# Patient Record
Sex: Male | Born: 1950 | Race: White | Hispanic: No | Marital: Married | Smoking: Former smoker
Health system: Southern US, Community
[De-identification: ages and names within clinical notes are randomized; demographics above are authoritative.]

## PROBLEM LIST (undated history)

## (undated) DIAGNOSIS — E611 Iron deficiency: Secondary | ICD-10-CM

## (undated) DIAGNOSIS — G8929 Other chronic pain: Secondary | ICD-10-CM

## (undated) DIAGNOSIS — R7303 Prediabetes: Secondary | ICD-10-CM

## (undated) DIAGNOSIS — M1990 Unspecified osteoarthritis, unspecified site: Secondary | ICD-10-CM

## (undated) DIAGNOSIS — I10 Essential (primary) hypertension: Secondary | ICD-10-CM

## (undated) DIAGNOSIS — IMO0001 Reserved for inherently not codable concepts without codable children: Secondary | ICD-10-CM

## (undated) DIAGNOSIS — M545 Low back pain: Secondary | ICD-10-CM

## (undated) DIAGNOSIS — K219 Gastro-esophageal reflux disease without esophagitis: Secondary | ICD-10-CM

## (undated) DIAGNOSIS — J302 Other seasonal allergic rhinitis: Secondary | ICD-10-CM

## (undated) DIAGNOSIS — E785 Hyperlipidemia, unspecified: Secondary | ICD-10-CM

## (undated) DIAGNOSIS — K649 Unspecified hemorrhoids: Secondary | ICD-10-CM

## (undated) DIAGNOSIS — K760 Fatty (change of) liver, not elsewhere classified: Secondary | ICD-10-CM

## (undated) HISTORY — PX: BACK SURGERY: SHX140

## (undated) HISTORY — DX: Hyperlipidemia, unspecified: E78.5

## (undated) HISTORY — PX: KNEE ARTHROSCOPY: SHX127

## (undated) HISTORY — PX: COLONOSCOPY W/ BIOPSIES AND POLYPECTOMY: SHX1376

## (undated) HISTORY — PX: FRACTURE SURGERY: SHX138

## (undated) HISTORY — DX: Unspecified osteoarthritis, unspecified site: M19.90

## (undated) HISTORY — DX: Gastro-esophageal reflux disease without esophagitis: K21.9

## (undated) HISTORY — DX: Essential (primary) hypertension: I10

---
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
## 2004-11-29 ENCOUNTER — Ambulatory Visit: Payer: Self-pay

## 2005-03-01 ENCOUNTER — Ambulatory Visit: Payer: Self-pay | Admitting: Gastroenterology

## 2005-03-30 ENCOUNTER — Ambulatory Visit: Payer: Self-pay | Admitting: Gastroenterology

## 2005-03-30 ENCOUNTER — Encounter (INDEPENDENT_AMBULATORY_CARE_PROVIDER_SITE_OTHER): Payer: Self-pay | Admitting: *Deleted

## 2007-11-01 ENCOUNTER — Ambulatory Visit (HOSPITAL_COMMUNITY): Admission: RE | Admit: 2007-11-01 | Discharge: 2007-11-01 | Payer: Self-pay | Admitting: Neurological Surgery

## 2008-04-12 HISTORY — PX: POSTERIOR LUMBAR FUSION: SHX6036

## 2008-04-30 ENCOUNTER — Inpatient Hospital Stay (HOSPITAL_COMMUNITY): Admission: RE | Admit: 2008-04-30 | Discharge: 2008-05-06 | Payer: Self-pay | Admitting: Neurological Surgery

## 2008-05-12 HISTORY — PX: ANTERIOR CERVICAL DECOMP/DISCECTOMY FUSION: SHX1161

## 2008-06-08 ENCOUNTER — Ambulatory Visit (HOSPITAL_COMMUNITY): Admission: RE | Admit: 2008-06-08 | Discharge: 2008-06-09 | Payer: Self-pay | Admitting: Neurological Surgery

## 2008-09-18 ENCOUNTER — Encounter: Payer: Self-pay | Admitting: Cardiology

## 2009-08-31 ENCOUNTER — Encounter: Payer: Self-pay | Admitting: Cardiology

## 2009-09-01 ENCOUNTER — Ambulatory Visit: Payer: Self-pay | Admitting: Cardiology

## 2009-09-01 DIAGNOSIS — R079 Chest pain, unspecified: Secondary | ICD-10-CM

## 2009-09-01 DIAGNOSIS — R42 Dizziness and giddiness: Secondary | ICD-10-CM

## 2009-09-01 DIAGNOSIS — I10 Essential (primary) hypertension: Secondary | ICD-10-CM | POA: Insufficient documentation

## 2009-09-14 ENCOUNTER — Telehealth (INDEPENDENT_AMBULATORY_CARE_PROVIDER_SITE_OTHER): Payer: Self-pay | Admitting: *Deleted

## 2009-09-15 ENCOUNTER — Encounter (HOSPITAL_COMMUNITY): Admission: RE | Admit: 2009-09-15 | Discharge: 2009-11-10 | Payer: Self-pay | Admitting: Cardiology

## 2009-09-15 ENCOUNTER — Ambulatory Visit: Payer: Self-pay | Admitting: Cardiology

## 2009-09-15 ENCOUNTER — Ambulatory Visit: Payer: Self-pay

## 2009-09-20 ENCOUNTER — Encounter: Payer: Self-pay | Admitting: Cardiology

## 2009-09-20 ENCOUNTER — Ambulatory Visit (HOSPITAL_COMMUNITY): Admission: RE | Admit: 2009-09-20 | Discharge: 2009-09-20 | Payer: Self-pay | Admitting: Cardiology

## 2009-09-20 ENCOUNTER — Ambulatory Visit: Payer: Self-pay

## 2009-09-20 ENCOUNTER — Ambulatory Visit: Payer: Self-pay | Admitting: Internal Medicine

## 2009-09-20 ENCOUNTER — Telehealth: Payer: Self-pay | Admitting: Cardiology

## 2009-09-21 ENCOUNTER — Ambulatory Visit: Payer: Self-pay | Admitting: Cardiology

## 2009-09-27 LAB — CONVERTED CEMR LAB
BUN: 25 mg/dL — ABNORMAL HIGH (ref 6–23)
Calcium: 8.9 mg/dL (ref 8.4–10.5)
Chloride: 106 meq/L (ref 96–112)
Creatinine, Ser: 1.1 mg/dL (ref 0.4–1.5)
GFR calc non Af Amer: 73.14 mL/min (ref >60)

## 2010-06-12 HISTORY — PX: PROSTATE BIOPSY: SHX241

## 2010-07-12 NOTE — Assessment & Plan Note (Signed)
Summary: np6/chest pain hx of high cholestrol/jml   Referring Provider:  Dr. Shanda Bumps Copland Primary Provider:  Dr. Cleta Alberts  CC:  new patient with chest pain.  He states he thought it was reflux but more often lately he has pain in his left arm.  Pt has also been getting high BP readings at home even after BP medication.  History of Present Illness: 60 yo with history of HTN and hyperlipidemia presents for evaluation of chest pain.  Patient started developing episodes of chest pain last week.  One day last week he walked 2 miles.  After stopping, he felt pressure in his central chest.  He got some relief from burping.  The chest pressure was associated with some pain in his left arm.  The total episode was about 30 minutes.  Since then, patient has had essentially daily episodes of this same chest pressure.  He has checked his BP when the pressure occurs and found it to be elevated around 150-160/100s.  It is not typically associated with exertion.  Most often, it happens about 30 minutes after eating or drinking mild or coffee.  Sometimes, however, he gets chest pressure that is not related to eating.  He also reports "dizziness" (seems like a lightheadedness) that has been lasting most of the day for the last week.  Patient was not orthostatic when checked today.  Patient has had hives with BC powder in the past, which contains ASA.  He has avoided ASA since then.   ECG: sinus arrhythmia, normal  Labs (4/10): HDL 47, LDL 84, TGs 200  Current Medications (verified): 1)  Lisinopril 40 Mg Tabs (Lisinopril) .... Take One Tablet Once Daily 2)  Pravastatin Sodium 40 Mg Tabs (Pravastatin Sodium) .... Take One Tablet Once Daily 3)  Vemma Vitamin Liquid .... Once Daily 4)  Diazepam 5 Mg Tabs (Diazepam) .... Take One Tablet As Needed  Allergies (verified): 1)  ! Jonne Ply  Past History:  Past Medical History: 1. HTN 2. ETT-myoview (6/06): EF 61%, no ischemia or infarction 3. Obesity 4.  Hyperlipidemia 5. Spinal stenosis s/p surgery 2/10.  Has chronic back pain.  6. GERD 7. Allergy (hives) to [facility] Powder, which contains ASA 650 + salicylamide + caffeine  Family History: No premature CAD.  Uncle had an MI in his 36s.   Social History: Married, disabled from back problems.  Has a grandson living with him.  Several children. Quit smoking 20 years ago.  No drugs.    Review of Systems       All systems reviewed and negative except as per HPI.   Vital Signs:  Patient profile:   60 year old male Height:      66 inches Weight:      218 pounds BMI:     35.31 Pulse rate:   83 / minute Pulse (ortho):   78 / minute Pulse rhythm:   regular BP sitting:   136 / 92  (left arm) BP standing:   147 / 97 Cuff size:   large  Vitals Entered By: Judithe Modest CMA (September 01, 2009 8:38 AM)  Serial Vital Signs/Assessments:  Time      Position  BP       Pulse  Resp  Temp     By 10:07 AM  Lying RA  120/90   66                    Amanda Trulove CMA 10:07 AM  Sitting  136/99   78                    Amanda Trulove CMA 10:07 AM  Standing  147/97   78                    Amanda Trulove CMA  Comments: 10:07 AM 2 minutes- 147/93 HR 70 3 minutes-146/100 HR 75 By: Judithe Modest CMA    Physical Exam  General:  Well developed, well nourished, in no acute distress. Head:  normocephalic and atraumatic Nose:  no deformity, discharge, inflammation, or lesions Mouth:  Teeth, gums and palate normal. Oral mucosa normal. Neck:  Neck supple, no JVD. No masses, thyromegaly or abnormal cervical nodes. Lungs:  Clear bilaterally to auscultation and percussion. Heart:  Non-displaced PMI, chest non-tender; regular rate and rhythm, S1, S2 without murmurs, rubs. Soft S4. Carotid upstroke normal, no bruit. Pedals normal pulses. No edema, no varicosities. Abdomen:  Bowel sounds positive; abdomen soft and non-tender without masses, organomegaly, or hernias noted. No hepatosplenomegaly. Msk:  Back  normal, normal gait. Muscle strength and tone normal. Extremities:  No clubbing or cyanosis. Neurologic:  Alert and oriented x 3. Skin:  Intact without lesions or rashes. Psych:  Normal affect.   Impression & Recommendations:  Problem # 1:  CHEST PAIN UNSPECIFIED (ICD-786.50) Patient has cardiac risk factors including HTN, hyperlipidemia, and age.  His chest pain has been occurring for about a week and is atypical in nature.  It may simply be GERD as often it seems to occur about 1/2 hour after eating.  Sometimes it seems to occur at random, however.  Given his risk factors, I will have him do a Lexiscan myoview (may not be able to complete ETT due to back pain).  I will also have him start omeprazole 40 mg daily.  He has a prescription for sucralfate as well.  He had hives after using BC Powder which contains ASA so I am a little reticent to start him on ASA at this time.  If there is an abnormality on the stress test I will readdress this.    Problem # 2:  DIZZINESS (ICD-780.4) Patient has felt "funny" for about a week.  Seems lightheaded all the time.  His BP has been high whenever he checks it despite recent increase in lisinopril to 40 mg daily.  He was not orthostatic today, BP actually rose with standing.  I suspect that his symptoms could be due to elevated BP.   Problem # 3:  UNSPECIFIED ESSENTIAL HYPERTENSION (ICD-401.9) Start chlorthalidone 12.5 mg daily + KCl 10 mEq daily today.  BMET in 2 wks.  Will reassess BP when he returns to clinic in 2 wks.   Other Orders: Nuclear Stress Test (Nuc Stress Test)  Patient Instructions: 1)  Start Omeprazole 40mg  daily 2)  Start Chlorthalidone 12.5mg  daily--this will be one-half of a 25mg  tablet 3)  Start KCl(potassium) daily 4)  Your physician has requested that you have a lexiscan myoview.  For further information please visit [email or web address]  Please follow instruction sheet, as given. 5)  Your physician recommends that you  schedule a follow-up appointment with Dr Shirlee Latch after testing is completed in about 2 weeks. 6)  Have lab done at your next appointment with Dr Shirlee Latch. BMP 401.1  786.50  7)  Take and record your blood pressure--bring the readings to your next appointment with Dr Shirlee Latch. Prescriptions: POTASSIUM CHLORIDE CR 10 MEQ CR-CAPS (POTASSIUM CHLORIDE) Take one  tablet by mouth daily  #30 x 6   Entered by:   Katina Dung, RN, BSN   Authorized by:   Marca Ancona, MD   Signed by:   Katina Dung, RN, BSN on 09/01/2009   Method used:   Electronically to        [facility] (retail)       [address]       [place], [place]  [address]       Ph: [phone]       Fax: [phone]   RxID:   [id number] CHLORTHALIDONE 25 MG TABS (CHLORTHALIDONE) one-half tablet daily  #15 x 6   Entered by:   Katina Dung, RN, BSN   Authorized by:   Marca Ancona, MD   Signed by:   Katina Dung, RN, BSN on 09/01/2009   Method used:   Electronically to        [facility] (retail)       [address]       [place], [place]  [address]       Ph: [phone]       Fax: [phone]   RxID:   [id number] OMEPRAZOLE 40 MG CPDR (OMEPRAZOLE) one daily  #30 x 6   Entered by:   Katina Dung, RN, BSN   Authorized by:   Marca Ancona, MD   Signed by:   Katina Dung, RN, BSN on 09/01/2009   Method used:   Electronically to        [facility] (retail)       [address]       [place], [place]  [address]       Ph: [phone]       Fax: [phone]   RxID:   [id number]

## 2010-07-12 NOTE — Progress Notes (Signed)
Summary: Nuclear Pre-Procedure  Phone Note Outgoing Call Call back at [facility] Phone [phone]   Call placed by: Stanton Kidney, EMT-P,  September 14, 2009 2:10 PM Action Taken: Phone Call Completed Summary of Call: Reviewed information on Myoview Information Sheet (see scanned document for further details).  Spoke with Patient.    Nuclear Med Background Indications for Stress Test: Evaluation for Ischemia   History: Myocardial Perfusion Study  History Comments: 6/06 MPS: EF=61%, NL  Symptoms: Chest Pressure with Exertion, Dizziness, Light-Headedness    Nuclear Pre-Procedure Cardiac Risk Factors: History of Smoking, Hypertension, Lipids Height (in): 66

## 2010-07-12 NOTE — Assessment & Plan Note (Signed)
Summary: f/u myoview   Referring Provider:  Dr. Abbe Amsterdam Primary Provider:  Dr. Cleta Alberts  CC:  follow up stress test.  History of Present Illness: 60 yo with history of HTN and hyperlipidemia presents for evaluation of chest pain.  Patient started developing episodes of chest pain several weeks ago.  One day he walked 2 miles.  After stopping, he felt pressure in his central chest.  He got some relief from burping.  The chest pressure was associated with some pain in his left arm.  The total episode was about 30 minutes.  Since then, patient had had essentially daily episodes of this same chest pressure.  He has checked his BP when the pressure occurs and found it to be elevated around 150-160/100s.  It was not typically associated with exertion.  Most often, it happens about 30 minutes after eating or drinking milk or coffee.  Sometimes, however, he got chest pressure that is not related to eating.  Patient has had hives with BC powder in the past, which contains ASA.  He has avoided ASA since then.   GIven his risk factors, I had the patient do a myoview.  This showed no ischemia or infarction, but EF was measured as 49%.  Visually, the EF appeared better.  Echo was done to confirm LV systolic function and suggested EF was 60-65%.  Patient cut back on caffeine and spicy foods.  This has essentially resolved the chest pain.  He never started omeprazole as he was able to control the  presumed GERD with dietary changes.  His BP has dropped considerably with the addition of chlorthalidone.  Sometimes he is lightheaded with standing.  BP in am after meds has gotten as low as 101/60.    Labs (4/10): HDL 47, LDL 84, TGs 200  Current Medications (verified): 1)  Lisinopril 40 Mg Tabs (Lisinopril) .... Take One Tablet Once Daily 2)  Pravastatin Sodium 40 Mg Tabs (Pravastatin Sodium) .... Take One Tablet Once Daily 3)  Vemma Vitamin Liquid .... Once Daily 4)  Diazepam 5 Mg Tabs (Diazepam) .... Take One  Tablet As Needed 5)  Omeprazole 40 Mg Cpdr (Omeprazole) .... One Daily 6)  Chlorthalidone 25 Mg Tabs (Chlorthalidone) .... One-Half Tablet Daily 7)  Potassium Chloride Cr 10 Meq Cr-Caps (Potassium Chloride) .... Take One Tablet By Mouth Daily  Allergies: 1)  ! Jonne Ply  Past History:  Past Medical History: 1. HTN 2. ETT-myoview (6/06): EF 61%, no ischemia or infarction.  Lexiscan myoview (4/11): EF 49%, no ischemia or infarction.  Visually, EF appeared normal so echo was done to follow this up.  Echo showed EF 55-60%, moderate diastolic dysfunction, no significant valvular dysfunction.  3. Obesity 4. Hyperlipidemia 5. Spinal stenosis s/p surgery 2/10.  Has chronic back pain.  6. GERD 7. Allergy (hives) to [facility] Powder, which contains ASA 650 + salicylamide + caffeine  Family History: Reviewed history from 09/01/2009 and no changes required. No premature CAD.  Uncle had an MI in his 78s.   Social History: Reviewed history from 09/01/2009 and no changes required. Married, disabled from back problems.  Has a grandson living with him.  Several children. Quit smoking 20 years ago.  No drugs.    Review of Systems       All systems reviewed and negative except as per HPI.   Vital Signs:  Patient profile:   60 year old male Height:      66 inches Weight:      215 pounds  BMI:     34.83 Pulse rate:   80 / minute Resp:     14 per minute BP sitting:   115 / 80  (left arm)  Vitals Entered By: Kem Parkinson (September 21, 2009 8:32 AM)  Physical Exam  General:  Well developed, well nourished, in no acute distress. Neck:  Neck supple, no JVD. No masses, thyromegaly or abnormal cervical nodes. Lungs:  Clear bilaterally to auscultation and percussion. Heart:  Non-displaced PMI, chest non-tender; regular rate and rhythm, S1, S2 without murmurs, rubs. Soft S4. Carotid upstroke normal, no bruit. Pedals normal pulses. No edema, no varicosities. Abdomen:  Bowel sounds positive; abdomen soft and  non-tender without masses, organomegaly, or hernias noted. No hepatosplenomegaly. Extremities:  No clubbing or cyanosis. Neurologic:  Alert and oriented x 3. Psych:  Normal affect.   Impression & Recommendations:  Problem # 1:  CHEST PAIN UNSPECIFIED (ICD-786.50) I suspect that the chest pain was GERD.  Myoview showed no ischemia or infarction, and pain episodes seem to have resolved with dietary changes aimed at decreasing GERD.  He can take over the counter omeprazole.    Problem # 2:  DIZZINESS (ICD-780.4) BP seems to be getting too low after taking his meds.  I have asked him to back off on lisinopril to 20 mg daily.  He will take his BP daily and will let us know if it starts getting too high.    Other Orders: TLB-BMP (Basic Metabolic Panel-BMET) (80048-METABOL)  Patient Instructions: 1)  Your physician has recommended you make the following change in your medication:  2)  Decrease Lisinopril to 20mg  daily 3)  Your physician recommends that you have  lab work today--BMP 401.9 4)  Your physician recommends that you schedule a follow-up appointment as needed with Dr Shirlee Latch.

## 2010-07-12 NOTE — Assessment & Plan Note (Signed)
Summary: Cardiology Nuclear Study  Nuclear Med Background Indications for Stress Test: Evaluation for Ischemia   History: Myocardial Perfusion Study  History Comments: 6/06 MPS: EF=61%, NL  Symptoms: Chest Pressure with Exertion, Diaphoresis, Dizziness, DOE, Light-Headedness, Palpitations    Nuclear Pre-Procedure Cardiac Risk Factors: History of Smoking, Hypertension, Lipids, Smoker Caffeine/Decaff Intake: none NPO After: 7:30 PM Lungs: Clear IV 0.9% NS with Angio Cath: 20g     IV Site: (R) Forearm IV Started by: Stanton Kidney EMT-P Chest Size (in) 44     Height (in): 66 Weight (lb): 216 BMI: 34.99  Nuclear Med Study 1 or 2 day study:  1 day     Stress Test Type:  Eugenie Birks Reading MD:  Olga Millers, MD     Referring MD:  Daltom Mclean Resting Radionuclide:  Technetium 37m Tetrofosmin     Resting Radionuclide Dose:  11 mCi  Stress Radionuclide:  Technetium 16m Tetrofosmin     Stress Radionuclide Dose:  33 mCi   Stress Protocol      Max HR:  114 bpm     Predicted Max HR:  163 bpm  Max Systolic BP: 129 mm Hg     Percent Max HR:  69.94 %Rate Pressure Product:  47425  Lexiscan: 0.4 mg   Stress Test Technologist:  Irean Hong RN     Nuclear Technologist:  Domenic Polite CNMT  Rest Procedure  Myocardial perfusion imaging was performed at rest 45 minutes following the intravenous administration of Myoview Technetium 44m Tetrofosmin.  Stress Procedure  The patient received IV Lexiscan 0.4 mg over 15-seconds.  Myoview injected at 30-seconds.  There were no significant changes with Lexiscan., rare PVC.  Quantitative spect images were obtained after a 45 minute delay.  QPS Raw Data Images:  Acuisition technically good; normal left ventricular size. Stress Images:  There is normal uptake in all areas. Rest Images:  Normal homogeneous uptake in all areas of the myocardium. Subtraction (SDS):  No evidence of ischemia. Transient Ischemic Dilatation:  1.18  (Normal <1.22)  Lung/Heart Ratio:  .34  (Normal <0.45)  Quantitative Gated Spect Images QGS EDV:  102 ml QGS ESV:  52 ml QGS EF:  49 % QGS cine images:  Normal wall motion; LV function appears better than calculated EF; suggest echocardiogram to further assess.   Overall Impression  Exercise Capacity: Lexiscan study with no exercise. BP Response: Normal blood pressure response. Clinical Symptoms: There is  chest pain ECG Impression: No significant ST segment change suggestive of ischemia. Overall Impression: There is no sign of scar or ischemia.  Appended Document: Cardiology Nuclear Study-appt 09-21-09 DM -needs echo no evidence for ischemia or infarction.  EF read as low but visually better.  Would get echo to confirm normal EF.   Appended Document: Cardiology Nuclear Study discussed results with patient by telephone--he agreed to echo at [facility] today   Clinical Lists Changes  Orders: Added new Referral order of Echocardiogram (Echo) - Signed

## 2010-07-12 NOTE — Progress Notes (Signed)
Summary: returning call  Phone Note Call from Patient Call back at Home Phone [phone]   Caller: Patient Reason for Call: Talk to Nurse Summary of Call: returning call Initial call taken by: Migdalia Dk,  September 20, 2009 10:55 AM  Follow-up for Phone Call        talked with patient about having echo

## 2010-08-25 ENCOUNTER — Encounter (INDEPENDENT_AMBULATORY_CARE_PROVIDER_SITE_OTHER): Payer: Self-pay | Admitting: *Deleted

## 2010-08-30 NOTE — Letter (Signed)
Summary: Pre Visit Letter Revised  [facility]  [address] [place], [place] [address]   Phone: [phone]  Fax: [phone]        08/25/2010 MRN: [id number] Russell Gonzales [address] [place], [place]  [address]             Procedure Date:  10-05-10           Recall Colon---Dr. Russella Dar   Welcome to the Gastroenterology Division at [facility].    You are scheduled to see a nurse for your pre-procedure visit on 09-21-10 at 10:30a.m. on the 3rd floor at [facility], [address]  We ask that you try to arrive at our office 15 minutes prior to your appointment time to allow for check-in.  Please take a minute to review the attached form.  If you answer "Yes" to one or more of the questions on the first page, we ask that you call the person listed at your earliest opportunity.  If you answer "No" to all of the questions, please complete the rest of the form and bring it to your appointment.    Your nurse visit will consist of discussing your medical and surgical history, your immediate family medical history, and your medications.   If you are unable to list all of your medications on the form, please bring the medication bottles to your appointment and we will list them.  We will need to be aware of both prescribed and over the counter drugs.  We will need to know exact dosage information as well.    Please be prepared to read and sign documents such as consent forms, a financial agreement, and acknowledgement forms.  If necessary, and with your consent, a friend or relative is welcome to sit-in on the nurse visit with you.  Please bring your insurance card so that we may make a copy of it.  If your insurance requires a referral to see a specialist, please bring your referral form from your primary care physician.  No co-pay is required for this nurse visit.     If you cannot keep your appointment, please call [phone] to cancel or  reschedule prior to your appointment date.  This allows [name] the opportunity to schedule an appointment for another patient in need of care.    Thank you for choosing [facility] for your medical needs.  We appreciate the opportunity to care for you.  Please visit [name] at our website  to learn more about our practice.  Sincerely, The Gastroenterology Division

## 2010-09-21 ENCOUNTER — Ambulatory Visit (AMBULATORY_SURGERY_CENTER): Payer: Medicare Other | Admitting: *Deleted

## 2010-09-21 ENCOUNTER — Other Ambulatory Visit: Payer: Self-pay | Admitting: Cardiology

## 2010-09-21 VITALS — Ht 66.5 in | Wt 233.0 lb

## 2010-09-21 DIAGNOSIS — Z860100 Personal history of colon polyps, unspecified: Secondary | ICD-10-CM

## 2010-09-21 MED ORDER — PEG-KCL-NACL-NASULF-NA ASC-C 100 G PO SOLR: ORAL | Status: DC

## 2010-09-28 ENCOUNTER — Other Ambulatory Visit: Payer: Self-pay | Admitting: *Deleted

## 2010-09-28 MED ORDER — CHLORTHALIDONE 25 MG PO TABS: 12.5000 mg | ORAL_TABLET | Freq: Every day | ORAL | Status: DC

## 2010-10-04 ENCOUNTER — Encounter: Payer: Self-pay | Admitting: Gastroenterology

## 2010-10-05 ENCOUNTER — Encounter: Payer: Self-pay | Admitting: Gastroenterology

## 2010-10-05 ENCOUNTER — Ambulatory Visit (AMBULATORY_SURGERY_CENTER): Payer: Medicare Other | Admitting: Gastroenterology

## 2010-10-05 DIAGNOSIS — Z1211 Encounter for screening for malignant neoplasm of colon: Secondary | ICD-10-CM

## 2010-10-05 DIAGNOSIS — Z86010 Personal history of colonic polyps: Secondary | ICD-10-CM

## 2010-10-05 DIAGNOSIS — D126 Benign neoplasm of colon, unspecified: Secondary | ICD-10-CM

## 2010-10-05 DIAGNOSIS — K5730 Diverticulosis of large intestine without perforation or abscess without bleeding: Secondary | ICD-10-CM

## 2010-10-05 MED ORDER — SODIUM CHLORIDE 0.9 % IV SOLN: 500.0000 mL | INTRAVENOUS | Status: DC

## 2010-10-05 NOTE — Patient Instructions (Signed)
Discharged instructions given with verbal understanding. Handouts on polyps, diverticulosis, and hemorrhoids given. Resume previous medications.

## 2010-10-06 ENCOUNTER — Telehealth: Payer: Self-pay

## 2010-10-06 NOTE — Telephone Encounter (Signed)

## 2010-10-10 ENCOUNTER — Encounter: Payer: Self-pay | Admitting: Gastroenterology

## 2010-10-25 NOTE — Op Note (Signed)
NAME:  Russell Gonzales, Russell Gonzales NO.:  [id number]   MEDICAL RECORD NO.:  [id number]          PATIENT TYPE:  OIB   LOCATION:  3535                         FACILITY:  [facility]   PHYSICIAN:  Stefani Dama, M.D.  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   PROCEDURES:  Anterior cervical decompression and arthrodesis at C5-C6  with structural allograft Alphatec plate fixation (Trestle)   SURGEON:  Stefani Dama, MD   FIRST ASSISTANT:  Coletta Memos, MD   ANESTHESIA:  General endotracheal.   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  After undergoing decompression of his  lumbar spine, the patient noted that he had developed numbness and  tingling in the fingers on the right hand accompanied with some  substantial neck pain which has persisted despite the passage of  considerable time.  An MRI of the cervical spine demonstrates that he  has advanced spondylitic changes at C5-C6 and he is advised regarding  surgical decompression at the C5-C6 level.   PROCEDURE:  The patient was brought to the operating room and placed on  the operating table in the supine position.  After smooth induction of  general endotracheal anesthesia, he was placed in 5 pounds of halter  traction.  Neck was prepped with alcohol and DuraPrep and draped in a  sterile fashion.  A transverse incision was created in the left side of  the neck and this was carried down through the platysma.  The plane  between the sternocleidomastoid and strap muscles was then  dissected  bluntly until the prevertebral space was reached.  The first  identifiable disk space was noted to be that of C4-C5 on localizing  radiograph, then by dissecting on either side of the longus coli  muscles.  Self-retaining Caspar retractor was placed into the wound and  then diskectomy was undertaken at C5-C6, opening ventral aspect of the  vertebral bodies with a #15 blade removing significant quantities of  severely degenerated and desiccated disk material and ventral  osteophytes were removed with a 3-mm Kerrison punch and also use of a  Beyer rongeur.  As the disk space was evacuated, small 3 curettes were  used to facilitate in removal of the cartilaginous endplates until the  region of posterior longitudinal ligament was reached.  Here, there was  a substantial osteophyte from the inferior margin body of C5.  This was  drilled down with a 2.3-mm dissecting tool and a high-speed instrument  and the dissection was carried out to the lateral recesses where  uncinate process spurs were also encountered, more so on the right than  on the left.  These were  dissected down.  The lateral recesses were then  freed with a 2-mm Kerrison punch.  Ultimately, the C6 nerve root could  be sounded easily at the right side and also at the left side after full  and thorough decompression was performed.  Hemostasis was then achieved  with some small pledgets of Gelfoam soaked in thrombin which were  irrigated away.  Then, with the endplates being shaved smooth with a 5-  mm barrel bit, an 8-mm transgraft was fashioned to the appropriate size  and shape and fitted into the interspace after being filled with some  demineralized bone matrix in the form of the gel.  This was then placed  into the interspace and countersunk appropriately.  The ventral aspect  of the vertebral bodies were then fitted with 14-mm Trestle plate and  this was locked into position with variable angle 14-mm screws.   Final  radiograph was obtained to check the position of the surgical construct.  Some remaining demineralized bone matrix was then packed into the  lateral recesses, more so on the left side than on the right side.  Hemostasis in the soft tissues was then meticulously obtained and  ultimately the retractors were removed and after careful inspection for  hemostasis of second and third time, the platysma was closed with 3-0  Vicryl in an interrupted fashion, 3-0 Vicryl was used in the  subcuticular skin, and then Dermabond was placed on the skin.  The  patient tolerated the procedure well and was returned to the recovery  room in stable condition.      Stefani Dama, M.D.  Electronically Signed     HJE/MEDQ  D:  06/08/2008  T:  06/09/2008  Job:  [id number]

## 2010-10-25 NOTE — Op Note (Signed)
NAME:  HUMBERT, MOROZOV NO.:  [id number]   MEDICAL RECORD NO.:  [id number]          PATIENT TYPE:  INP   LOCATION:  3109                         FACILITY:  [facility]   PHYSICIAN:  Stefani Dama, M.D.  DATE OF BIRTH:  [birth year]   DATE OF PROCEDURE:  04/30/2008  DATE OF DISCHARGE:                               OPERATIVE REPORT   PREOPERATIVE DIAGNOSES:  Lumbar stenosis, lumbar scoliosis, L2, L3, L4,  and L5; status post traumatic fracture, L3 and L4 30 years ago.   POSTOPERATIVE DIAGNOSES:  Lumbar stenosis, lumbar scoliosis, L2, L3, L4,  and L5; status post traumatic fracture, L3 and L4 30 years ago.   PROCEDURES:  Lumbar laminectomy, L2, L3, L4, and L5; decompression of  L3, L4, and L5 nerve roots; posterior interbody arthrodesis with PEEK  spacer, L4-L5; segmental fixation, L1-S1 with pedicle screws; and  posterolateral arthrodesis with local autograft, allograft, and Infuse,  L1-S1.   SURGEON:  Stefani Dama, MD   FIRST ASSISTANT:  Payton Doughty, MD   ANESTHESIA:  General endotracheal.   INDICATIONS:  Russell Gonzales is a 60 year old individual who has had  significant problems with back pain and leg pain mostly on the left hip  and left buttock.  He had sustained a fracture some 30 years ago of his  L2-L4 complex with the worst fracture at L3.  He had developed a  significant kyphosis and scoliosis across this level and was complaining  of severe back pain and leg pain with any activity.  He was evaluated  with a myelogram and post myelogram CAT scan several months ago and was  advised regarding the decompression and stabilization procedure.  He was  now taken to the operating room for this procedure.   PROCEDURE:  The patient was brought to the operating room supine on the  stretcher.  After smooth induction of general endotracheal anesthesia,  he was turned prone and the back was prepped with alcohol and DuraPrep  and draped in the sterile fashion.  A  midline incision was created and  carried down to the lumbodorsal fascia, which was opened on either side  of the midline to expose the interlaminar spaces and the lamina from L1  and S1.  The individual spinous processes of L5, L1, and L3 were  identified on a radiograph and then dissection was carried out to secure  these and dissect over the region of the facet joints.  On the right  side, there was grotesque overgrowth of the facet joint at the L3-4  complex where a posterior fusion had formed itself.  This mass was taken  down to allow decompression, and ultimately, the laminectomy was created  removing the laminar arches of L2, L3, and L4, and ultimately L5.  At  L4, the decompression was undertaken.  There was noted to be adherence  of the dura to the laminar arch and a dural tear had occurred.  The  lamina of L5 was then carefully removed around this dural tear.  The  arachnoid was not incised, and then the dura was closed with  the use of  the operating microscope in a microdissection technique to secure any  CSF leak.  Three separate 6-0 Prolene sutures were required for this  dural repair.  Dissection was then continued further distally and  ultimately, we could sound out the S1 nerve root, the L5 nerve root, and  foraminotomy created over them.  L4 nerve root was particularly snug on  the left side where there was the start of the scoliotic curve.  This  area was decompressed carefully, and the disk space was then entered  ultimately.  We could gain good egress of the L5 nerve root as it exited  below the L4-5 disk space on the left side.  The complete diskectomy was  performed from the left-sided approach using combination of curettes,  rongeurs, and disk shavers to remove significant disk osteophyte complex  and disk material to the point where an interbody spacer could be  placed.  A 10-mm spacer was then chosen and placed into this area to  obtain some distraction of the  disk space, thus opening the region of  the foramen for the L5 nerve root inferiorly and the L4 nerve root  superiorly.  At this point, the L4 nerve root could be sounded easily.  Further dissection yielded decompression of the L3 nerve root and  ultimately the L2 nerve root superiorly.  The mass from L2-L4 was noted  be fused together and despite removal of the laminar arches, the fusion  mass persisted.  It was felt that osteotomies of this would not be  possible because of the grotesque overgrowth of the ventral aspect of  the vertebral bodies, and this was left together.  Pedicle screw sites  were then chosen using fluoroscopic guidance placing bilateral pedicle  screws on the sacrum.  These were measured at 7.5 diameter x 45 mm  screws on the right and 7.5 x 55 mm screw on the left, 6.5 x 45 mm  screws were placed at L5, L4 on the left side could be instrumented with  a 6.5 x 45 mm screw, then L2 on the left side could be instrumented with  a 5.5 x 45 mm screw, and L1 had 6.5 x 45 mm screws placed bilaterally.  The right-sided vertebral body of L2 also had a 6.5 x 45 mm screw  placed.  L4 was also instrumented on the left side with a 6.5 x 45 mm  screws.  Once the screws were placed, rods were contoured to fit between  the screw heads.  On the right side, a sidecar connector was required to  secure the L5 screw to the construct.  Reduction of the scoliosis was  not possible because of the solid fusion of L2-L4.  Fixation was then  performed in situ fashion with pedicle screw hardware to secure L1-S1.  On the left side, the rod could be contoured to fit the screw heads as  they were, although the left-sided sacral screw was lengthened to the 55  mm length in order to allow purchase on the rod.  Once this was secured,  the posterolateral gutters were packed with bone graft in the form of  Vitoss bone sponge along with local autograft that was harvested from  the laminar arches.  This  was also fortified with strips of Infuse in  the lateral gutters.  At this point, then with bone graft being placed  and the hardware being secured, hemostasis was checked in the lateral  gutters.  The  CSF space was checked with secured type watertight closure  in all of the dural areas.  The nerve roots were again sounded to make  sure that they were well decompressed.  Once this was found to be the  case, then the lumbodorsal fascia was closed with #1 Vicryl in an  interrupted fashion, 2-0 Vicryl was used subcutaneously, 3-0 Vicryl was  used subcuticularly, and surgical staples were placed in the skin.  Blood loss for this procedure which lasted approximately 9 hours was  4000 mL, 1800 mL of Cell Saver blood was returned to the patient.      Stefani Dama, M.D.  Electronically Signed     HJE/MEDQ  D:  04/30/2008  T:  05/01/2008  Job:  [id number]

## 2010-10-28 NOTE — Discharge Summary (Signed)
NAME:  [name], [name] NO.:  [id number]   MEDICAL RECORD NO.:  [id number]          PATIENT TYPE:  INP   LOCATION:  3007                         FACILITY:  [facility]   PHYSICIAN:  Stefani Dama, M.D.  DATE OF BIRTH:  [birth year]   DATE OF ADMISSION:  04/30/2008  DATE OF DISCHARGE:  05/06/2008                               DISCHARGE SUMMARY   ADMITTING DIAGNOSIS:  Spinal stenosis, scoliosis L2-L3, L3-L4, L4-L5  with radiculopathy, status post L3-L4 fracture remotely 30 years ago.   DISCHARGE DIAGNOSES:  1. Spinal stenosis, scoliosis L2-L3, L3-L4, L4-L5 with radiculopathy,      status post L3-L4 fracture remotely 30 years ago.  2. Acute blood loss anemia, stable.   OPERATIONS AND PROCEDURES:  Decompression L2-L3, L4-L5, posterolateral  interbody fusion L4-L5 with segmental fixation L1-S1, and posterolateral  fusion L1-S1.   BRIEF HISTORY:  Russell Gonzales is a 61 year old male who had a remote  fracture to his lumbar spine 30 years ago, developed a degenerative  collapsing scoliosis, now has chronic low back pain and radiculopathy  and live with conservative measures and elects to proceed with surgical  fixation.   HOSPITAL COURSE:  The patient underwent posterior spinal fusion L1-S1  with decompression on November 19, tolerated procedure well, placed on a  PCA Dilaudid pump for pain control, placed on clear liquids first day.  Postoperatively, he was eating well.  Foley catheter was discontinued.  Hemoglobin was 8.1, hematocrit 22.7.  He was transferred out of the  neurosurgical ICU to 3000.  On second day postoperatively, he started  with physical therapy, occupational therapy, his IV fluids were  increased.  Vital signs were stable, afebrile.  BMET within normal  limits.  He continued to make slow progress over the weekend.  He was  weaned off his PCA pump to p.o. pain meds.  He was ready for discharge  home on November 25.  He was eating well, voiding well,  comfortable on  Percocet and Valium.  He was given Lovenox prophylaxis against DVT.  A  durable medical equipment was ordered as needed.  He is to follow back  precautions.  Aspen Quickdraw brace while he is up and out of bed.  Follow up with Dr. Danielle Dess in 3 to 4 weeks.  Contact our office prior to  followup with any questions, concerns, fever greater than 103, or  concerns about wound healing, any signs of infection.  All questions  encouraged and answered.   DISCHARGE CONDITION:  Stable, improved.   DISCHARGE DIAGNOSES:  Spondylosis, degenerative disk disease lumbar  spine with collapsing degenerative scoliosis, previous L2-L3 fracture,  and spinal stenosis.      Aura Fey Bobbe Medico.      Stefani Dama, M.D.  Electronically Signed    SCI/MEDQ  D:  07/23/2008  T:  07/23/2008  Job:  [id number]

## 2011-03-14 LAB — POCT I-STAT 7, (LYTES, BLD GAS, ICA,H+H)
Acid-Base Excess: 2
Bicarbonate: 26.8 — ABNORMAL HIGH
Calcium, Ion: 1.17
HCT: 30 — ABNORMAL LOW
O2 Saturation: 100
O2 Saturation: 100
Potassium: 4.6
Sodium: 139
Sodium: 140
pCO2 arterial: 41.1
pH, Arterial: 7.408

## 2011-03-14 LAB — BASIC METABOLIC PANEL
BUN: 16
BUN: 7
CO2: 25
CO2: 25
CO2: 27
Calcium: 6.6 — ABNORMAL LOW
Calcium: 6.7 — ABNORMAL LOW
Calcium: 8 — ABNORMAL LOW
Chloride: 104
Chloride: 108
Chloride: 96
Creatinine, Ser: 0.73
Creatinine, Ser: 0.74
Creatinine, Ser: 0.79
Creatinine, Ser: 1.09
GFR calc Af Amer: >60
GFR calc Af Amer: >60
GFR calc Af Amer: >60
GFR calc Af Amer: >60
GFR calc Af Amer: >60
GFR calc non Af Amer: >60
GFR calc non Af Amer: >60
GFR calc non Af Amer: >60
Glucose, Bld: 101 — ABNORMAL HIGH
Glucose, Bld: 99
Potassium: 4.3
Sodium: 130 — ABNORMAL LOW
Sodium: 134 — ABNORMAL LOW
Sodium: 138

## 2011-03-14 LAB — CROSSMATCH
ABO/RH(D): O POS
Antibody Screen: NEGATIVE

## 2011-03-14 LAB — HEPATIC FUNCTION PANEL
Albumin: 4
Alkaline Phosphatase: 73
Bilirubin, Direct: <0.1
Total Bilirubin: 0.8

## 2011-03-14 LAB — CBC
HCT: 30.4 — ABNORMAL LOW
HCT: 42.6
Hemoglobin: 10 — ABNORMAL LOW
Hemoglobin: 10.4 — ABNORMAL LOW
Hemoglobin: 14.6
Hemoglobin: 7.8 — CL
MCHC: 34.4
MCHC: 34.4
MCHC: 34.6
MCHC: 35.7
MCV: 96.8
MCV: 97.3
Platelets: 95 — ABNORMAL LOW
RBC: 2.28 — ABNORMAL LOW
RBC: 2.99 — ABNORMAL LOW
RBC: 3.12 — ABNORMAL LOW
RBC: 4.4
RDW: 12.9
RDW: 13.3
WBC: 11.6 — ABNORMAL HIGH
WBC: 5.3

## 2011-03-14 LAB — TYPE AND SCREEN
ABO/RH(D): O POS
Antibody Screen: NEGATIVE

## 2011-03-17 LAB — CBC
HCT: 39.2 % (ref 39.0–52.0)
Hemoglobin: 13.1 g/dL (ref 13.0–17.0)
MCHC: 33.3 g/dL (ref 30.0–36.0)
MCV: 95.2 fL (ref 78.0–100.0)
RBC: 4.11 MIL/uL — ABNORMAL LOW (ref 4.22–5.81)

## 2011-03-17 LAB — BASIC METABOLIC PANEL
CO2: 29 mEq/L (ref 19–32)
Calcium: 8.9 mg/dL (ref 8.4–10.5)
Chloride: 102 mEq/L (ref 96–112)
GFR calc Af Amer: >60 mL/min (ref >60)
Glucose, Bld: 73 mg/dL (ref 70–99)
Potassium: 3.8 mEq/L (ref 3.5–5.1)
Sodium: 139 mEq/L (ref 135–145)

## 2011-08-15 ENCOUNTER — Encounter: Payer: Self-pay | Admitting: Emergency Medicine

## 2011-08-29 ENCOUNTER — Ambulatory Visit: Payer: Self-pay | Admitting: Emergency Medicine

## 2012-02-20 ENCOUNTER — Encounter: Payer: Self-pay | Admitting: Emergency Medicine

## 2012-02-25 ENCOUNTER — Other Ambulatory Visit: Payer: Self-pay | Admitting: Emergency Medicine

## 2012-02-27 ENCOUNTER — Other Ambulatory Visit: Payer: Self-pay | Admitting: Emergency Medicine

## 2012-03-05 ENCOUNTER — Other Ambulatory Visit: Payer: Self-pay | Admitting: Emergency Medicine

## 2012-03-05 NOTE — Telephone Encounter (Signed)
Chart pulled to PA pool at nurse station [phone]

## 2012-03-06 ENCOUNTER — Other Ambulatory Visit: Payer: Self-pay | Admitting: Emergency Medicine

## 2012-03-06 ENCOUNTER — Ambulatory Visit (INDEPENDENT_AMBULATORY_CARE_PROVIDER_SITE_OTHER): Payer: Medicare Other | Admitting: Emergency Medicine

## 2012-03-06 ENCOUNTER — Ambulatory Visit: Payer: Medicare Other

## 2012-03-06 VITALS — BP 124/76 | HR 97 | Temp 98.3°F | Resp 16 | Ht 65.5 in | Wt 243.0 lb

## 2012-03-06 DIAGNOSIS — E785 Hyperlipidemia, unspecified: Secondary | ICD-10-CM

## 2012-03-06 DIAGNOSIS — I10 Essential (primary) hypertension: Secondary | ICD-10-CM

## 2012-03-06 DIAGNOSIS — Z23 Encounter for immunization: Secondary | ICD-10-CM

## 2012-03-06 DIAGNOSIS — Z139 Encounter for screening, unspecified: Secondary | ICD-10-CM

## 2012-03-06 DIAGNOSIS — Z0000 Encounter for general adult medical examination without abnormal findings: Secondary | ICD-10-CM

## 2012-03-06 LAB — IFOBT (OCCULT BLOOD): IFOBT: POSITIVE

## 2012-03-06 LAB — COMPREHENSIVE METABOLIC PANEL
Albumin: 4.3 g/dL (ref 3.5–5.2)
Alkaline Phosphatase: 71 U/L (ref 39–117)
BUN: 13 mg/dL (ref 6–23)
Creat: 0.8 mg/dL (ref 0.50–1.35)
Glucose, Bld: 102 mg/dL — ABNORMAL HIGH (ref 70–99)
Potassium: 4.6 mEq/L (ref 3.5–5.3)

## 2012-03-06 LAB — POCT URINALYSIS DIPSTICK
Glucose, UA: NEGATIVE
Nitrite, UA: NEGATIVE
Urobilinogen, UA: 0.2

## 2012-03-06 LAB — POCT CBC
Lymph, poc: 2.2 (ref 0.6–3.4)
MCHC: 32 g/dL (ref 31.8–35.4)
MPV: 8.1 fL (ref 0–99.8)
POC Granulocyte: 4 (ref 2–6.9)
POC LYMPH PERCENT: 32.3 %L (ref 10–50)
POC MID %: 8.5 %M (ref 0–12)
RDW, POC: 13.7 %

## 2012-03-06 LAB — LIPID PANEL
Cholesterol: 184 mg/dL (ref 0–200)
HDL: 57 mg/dL (ref >39)
Triglycerides: 116 mg/dL (ref <150)
VLDL: 23 mg/dL (ref 0–40)

## 2012-03-06 LAB — POCT UA - MICROSCOPIC ONLY
Casts, Ur, LPF, POC: NEGATIVE
Yeast, UA: NEGATIVE

## 2012-03-06 MED ORDER — PRAVASTATIN SODIUM 40 MG PO TABS: 40.0000 mg | ORAL_TABLET | Freq: Every day | ORAL | Status: DC

## 2012-03-06 MED ORDER — LISINOPRIL 20 MG PO TABS: 20.0000 mg | ORAL_TABLET | Freq: Every day | ORAL | Status: DC

## 2012-03-06 MED ORDER — CHLORTHALIDONE 25 MG PO TABS: 12.5000 mg | ORAL_TABLET | Freq: Every day | ORAL | Status: DC

## 2012-03-06 MED ORDER — OMEPRAZOLE 40 MG PO CPDR: 40.0000 mg | DELAYED_RELEASE_CAPSULE | Freq: Every day | ORAL | Status: DC

## 2012-03-06 NOTE — Progress Notes (Signed)
@[email or web address] @  Patient ID: Russell Gonzales MRN: [id number], DOB: Apr 06, 1951 61 y.o. Date of Encounter: 03/06/2012, 10:58 AM  Primary Physician: Lucilla Edin, MD  Chief Complaint: Physical (CPE)  HPI: 61 y.o. y/o male with history noted below here for CPE.  Doing well. No issues/complaints.  Review of Systems:  Consitutional: No fever, chills, fatigue, night sweats, lymphadenopathy, or weight changes. Eyes: No visual changes, eye redness, or discharge. ENT/Mouth: Ears: No otalgia, tinnitus, hearing loss, discharge. Nose: No congestion, rhinorrhea, sinus pain, or epistaxis. Throat: No sore throat, post nasal drip, or teeth pain. Cardiovascular: No CP, palpitations, diaphoresis, DOE, edema, orthopnea, PND he is on medications for blood pressure and high cholesterol. Respiratory: No cough, hemoptysis, SOB, or wheezing. Gastrointestinal: No anorexia, dysphagia, reflux, pain, nausea, vomiting, hematemesis, diarrhea, constipation, BRBPR, or melena. Genitourinary: No dysuria, frequency, urgency, hematuria, incontinence, nocturia, decreased urinary stream, discharge, impotence, or testicular pain/masses. Musculoskeletal: No decreased ROM, myalgias, stiffness, joint swelling, or weakness. Skin: No rash, erythema, lesion changes, pain, warmth, jaundice, or pruritis. Neurological: No headache, dizziness, syncope, seizures, tremors, memory loss, coordination problems, or paresthesias. Psychological: No anxiety, depression, hallucinations, SI/HI. Endocrine: No fatigue, polydipsia, polyphagia, polyuria, or known diabetes. All other systems were reviewed and are otherwise negative.  Past Medical History  Diagnosis Date  . Allergy     seasonal  . Arthritis   . GERD (gastroesophageal reflux disease)   . Hyperlipidemia   . Hypertension      Past Surgical History  Procedure Date  . Cervical fusion   . Back surgery     Has rods and screws between 4 vertebrae  . Colonoscopy   . Polypectomy   .  Prostate biopsy   . Ankles has screws left    . L knee cyst removal      Home Meds:  Prior to Admission medications   Medication Sig Start Date End Date Taking? Authorizing Provider  fish oil-omega-3 fatty acids 1000 MG capsule Take 1 g by mouth 2 (two) times daily.     Yes Historical Provider, MD  ketoconazole (NIZORAL) 2 % cream  09/21/10  Yes Historical Provider, MD  lisinopril (PRINIVIL,ZESTRIL) 20 MG tablet TAKE ONE TABLET BY MOUTH EVERY DAY 03/05/12  Yes Ryan M Dunn, PA-C  pravastatin (PRAVACHOL) 40 MG tablet TAKE ONE TABLET BY MOUTH EVERY DAY 03/05/12  Yes Ryan M Dunn, PA-C  chlorthalidone (HYGROTON) 25 MG tablet Take 0.5 tablets (12.5 mg total) by mouth daily. 09/28/10   Laurey Morale, MD  omeprazole (PRILOSEC) 40 MG capsule Take 40 mg by mouth daily.      Historical Provider, MD  peg 3350 powder (MOVIPREP) 100 G SOLR MOVI PREP take as directed 09/21/10   Meryl Dare, MD,FACG    Allergies:  Allergies  Allergen Reactions  . Aspirin     REACTION: hives  . Bc Fast Pain (Aspirin-Caffeine) Hives    History   Social History  . Marital Status: Married    Spouse Name: N/A    Number of Children: N/A  . Years of Education: N/A   Occupational History  . Not on file.   Social History Main Topics  . Smoking status: Current Some Day Smoker -- 30 years    Types: Cigars  . Smokeless tobacco: Never Used  . Alcohol Use: 10.5 oz/week    21 drink(s) per week  . Drug Use: No  . Sexually Active: Not on file   Other Topics Concern  . Not on file   Social  History Narrative  . No narrative on file    Family History  Problem Relation Age of Onset  . Pancreatic cancer Mother   . Prostate cancer Father     Physical Exam: Blood pressure 124/76, pulse 97, temperature 98.3 F (36.8 C), temperature source Oral, resp. rate 16, height 5' 5.5" (1.664 m), weight 243 lb (110.224 kg), SpO2 95.00%.  General: Well developed, well nourished, in no acute distress. HEENT: Normocephalic,  atraumatic. Conjunctiva pink, sclera non-icteric. Pupils 2 mm constricting to 1 mm, round, regular, and equally reactive to light and accomodation. EOMI. Internal auditory canal clear. TMs with good cone of light and without pathology. Nasal mucosa pink. Nares are without discharge. No sinus tenderness. Oral mucosa pink. DentitionNormal. Pharynx without exudate.   Neck: Supple. Trachea midline. No thyromegaly. Full ROM. No lymphadenopathy. Lungs: Clear to auscultation bilaterally without wheezes, rales, or rhonchi. Breathing is of normal effort and unlabored. Cardiovascular: RRR with S1 S2. No murmurs, rubs, or gallops appreciated. Distal pulses 2+ symmetrically. No carotid or abdominal bruits.  Abdomen: Soft, non-tender, non-distended with normoactive bowel sounds. No hepatosplenomegaly or masses. No rebound/guarding. No CVA tenderness. Without hernias.  Rectal: No external hemorrhoids or fissures. Rectal vault without masses.   Genitourinary:   circumcised male. No penile lesions. Testes descended bilaterally, and smooth without tenderness or masses.  Musculoskeletal: There are large scars present over the lumbar spine and over the C-spine. There is marked decrease range of motion in his upper and lower back. He has a significant scoliotic curve in the thoracic area but     Skin: Warm and moist without erythema, ecchymosis, wounds, or rash. Neuro: A+Ox3. CN II-XII grossly intact. Moves all extremities spontaneously. Full sensation throughout. Normal gait. DTR 2+ throughout upper and lower extremities. Finger to nose intact. Psych:  Responds to questions appropriately with a normal affect.   Studies: CBC, CMET, Lipid, PSA, TSH,   all pending. EKG normal sinus rhythm [facility] reading (PRIMARY) by  Dr Cleta Alberts he has a significant thoracic scoliotic curve there are no other cardio pulmonary abnormalies     Assessment/Plan:  61 y.o. y/o   male here for CPE meds refilled today is currently on  lisinopril and a statin.Marland Kitchen Routine labs were drawn today today  -  Signed, Earl Lites, MD 03/06/2012 10:58 AM

## 2012-03-07 ENCOUNTER — Encounter: Payer: Self-pay | Admitting: Emergency Medicine

## 2012-03-08 LAB — HEPATITIS PANEL, ACUTE
HCV Ab: NEGATIVE
Hep A IgM: NEGATIVE
Hep B C IgM: NEGATIVE
Hepatitis B Surface Ag: NEGATIVE

## 2012-09-03 ENCOUNTER — Encounter: Payer: Self-pay | Admitting: Emergency Medicine

## 2012-09-03 ENCOUNTER — Other Ambulatory Visit: Payer: Self-pay | Admitting: Emergency Medicine

## 2012-09-03 ENCOUNTER — Ambulatory Visit: Payer: Medicare Other

## 2012-09-03 ENCOUNTER — Ambulatory Visit (INDEPENDENT_AMBULATORY_CARE_PROVIDER_SITE_OTHER): Payer: Medicare Other | Admitting: Emergency Medicine

## 2012-09-03 VITALS — BP 136/89 | HR 75 | Temp 97.0°F | Resp 18 | Ht 66.5 in | Wt 245.0 lb

## 2012-09-03 DIAGNOSIS — I10 Essential (primary) hypertension: Secondary | ICD-10-CM

## 2012-09-03 DIAGNOSIS — R109 Unspecified abdominal pain: Secondary | ICD-10-CM

## 2012-09-03 DIAGNOSIS — E785 Hyperlipidemia, unspecified: Secondary | ICD-10-CM

## 2012-09-03 LAB — COMPREHENSIVE METABOLIC PANEL
ALT: 91 U/L — ABNORMAL HIGH (ref 0–53)
AST: 78 U/L — ABNORMAL HIGH (ref 0–37)
Albumin: 4.4 g/dL (ref 3.5–5.2)
Alkaline Phosphatase: 71 U/L (ref 39–117)
Potassium: 4.7 mEq/L (ref 3.5–5.3)
Sodium: 137 mEq/L (ref 135–145)
Total Protein: 7.2 g/dL (ref 6.0–8.3)

## 2012-09-03 LAB — CBC WITH DIFFERENTIAL/PLATELET
Basophils Absolute: 0 10*3/uL (ref 0.0–0.1)
Eosinophils Absolute: 0.2 10*3/uL (ref 0.0–0.7)
Eosinophils Relative: 3 % (ref 0–5)
Lymphocytes Relative: 34 % (ref 12–46)
MCV: 87.4 fL (ref 78.0–100.0)
Neutrophils Relative %: 53 % (ref 43–77)
Platelets: 363 10*3/uL (ref 150–400)
RDW: 14.2 % (ref 11.5–15.5)
WBC: 6.6 10*3/uL (ref 4.0–10.5)

## 2012-09-03 LAB — POCT URINALYSIS DIPSTICK
Glucose, UA: NEGATIVE
Nitrite, UA: NEGATIVE
Urobilinogen, UA: 0.2

## 2012-09-03 LAB — POCT UA - MICROSCOPIC ONLY
Bacteria, U Microscopic: NEGATIVE
Casts, Ur, LPF, POC: NEGATIVE
WBC, Ur, HPF, POC: NEGATIVE
Yeast, UA: NEGATIVE

## 2012-09-03 LAB — LIPID PANEL: LDL Cholesterol: 101 mg/dL — ABNORMAL HIGH (ref 0–99)

## 2012-09-03 NOTE — Progress Notes (Signed)
  Subjective:    Patient ID: Russell Gonzales, male    DOB: 07/06/1950, 62 y.o.   MRN: [id number]  HPI patient states he has been feeling well until last week when he had an episode of left lower quadrant abdominal pain over the week this pain gradually resolved. He is up-to-date on his colonoscopies. He is known to have polyps. He's never had a history of kidney stones. He he did not see any blood in his urine    Review of Systems     Objective:   Physical Exam patient is alert and cooperative . H. EENT exam is unremarkable. His chest is clear. There is a large scar over the lower thoracic and lumbar spine. The abdomen is obese. There are no areas of tenderness bowel sounds are symmetrical no masses are felt. [facility] reading (PRIMARY) by  Dr. Cleta Alberts there are internal fixation devices along the lumbar spine. I do not seen signs of kidney stone.  Results for orders placed in visit on 09/03/12  POCT UA - MICROSCOPIC ONLY      Result Value Range   WBC, Ur, HPF, POC neg     RBC, urine, microscopic 0-1     Bacteria, U Microscopic neg     Mucus, UA neg     Epithelial cells, urine per micros 0-1     Crystals, Ur, HPF, POC neg     Casts, Ur, LPF, POC neg     Yeast, UA neg    POCT URINALYSIS DIPSTICK      Result Value Range   Color, UA yellow     Clarity, UA clear     Glucose, UA neg     Bilirubin, UA neg     Ketones, UA neg     Spec Grav, UA 1.020     Blood, UA neg     pH, UA 5.5     Protein, UA neg     Urobilinogen, UA 0.2     Nitrite, UA neg     Leukocytes, UA Negative          Assessment & Plan:  Patient is an episode of left lower quadrant pain suspicious for low-grade diverticulitis versus a kidney stone. We'll go ahead and check his routine blood work along with a KUB and urine. No signs of a stone at present with a normal urine and KUB not showing any calcifications. A schedule for his routine physical in 6 months. He is okay with his current prescriptions

## 2012-09-04 ENCOUNTER — Other Ambulatory Visit: Payer: Self-pay | Admitting: Emergency Medicine

## 2012-09-04 LAB — IRON AND TIBC
Iron: 33 ug/dL — ABNORMAL LOW (ref 42–165)
TIBC: 420 ug/dL (ref 215–435)
UIBC: 387 ug/dL (ref 125–400)

## 2012-09-04 LAB — FERRITIN: Ferritin: 22 ng/mL (ref 22–322)

## 2012-09-05 ENCOUNTER — Ambulatory Visit
Admission: RE | Admit: 2012-09-05 | Discharge: 2012-09-05 | Disposition: A | Discharge status: Home / Self Care | Payer: Medicare Other | Source: Ambulatory Visit | Attending: Emergency Medicine | Admitting: Emergency Medicine

## 2013-03-11 ENCOUNTER — Encounter: Payer: Self-pay | Admitting: Emergency Medicine

## 2013-03-11 ENCOUNTER — Ambulatory Visit (INDEPENDENT_AMBULATORY_CARE_PROVIDER_SITE_OTHER): Payer: Medicare Other | Admitting: Emergency Medicine

## 2013-03-11 VITALS — BP 134/84 | HR 72 | Temp 98.0°F | Resp 16 | Ht 65.5 in | Wt 255.0 lb

## 2013-03-11 DIAGNOSIS — R7309 Other abnormal glucose: Secondary | ICD-10-CM

## 2013-03-11 DIAGNOSIS — R739 Hyperglycemia, unspecified: Secondary | ICD-10-CM

## 2013-03-11 DIAGNOSIS — D509 Iron deficiency anemia, unspecified: Secondary | ICD-10-CM

## 2013-03-11 DIAGNOSIS — Z23 Encounter for immunization: Secondary | ICD-10-CM

## 2013-03-11 DIAGNOSIS — R635 Abnormal weight gain: Secondary | ICD-10-CM

## 2013-03-11 DIAGNOSIS — E785 Hyperlipidemia, unspecified: Secondary | ICD-10-CM | POA: Insufficient documentation

## 2013-03-11 LAB — CBC WITH DIFFERENTIAL/PLATELET
Basophils Absolute: 0 10*3/uL (ref 0.0–0.1)
Basophils Relative: 0 % (ref 0–1)
Eosinophils Absolute: 0.2 10*3/uL (ref 0.0–0.7)
Hemoglobin: 15.3 g/dL (ref 13.0–17.0)
MCH: 34.5 pg — ABNORMAL HIGH (ref 26.0–34.0)
MCHC: 35.1 g/dL (ref 30.0–36.0)
Monocytes Absolute: 0.4 10*3/uL (ref 0.1–1.0)
Monocytes Relative: 9 % (ref 3–12)
Neutro Abs: 2.8 10*3/uL (ref 1.7–7.7)
Neutrophils Relative %: 61 % (ref 43–77)
RDW: 13.6 % (ref 11.5–15.5)

## 2013-03-11 LAB — POCT GLYCOSYLATED HEMOGLOBIN (HGB A1C): Hemoglobin A1C: 6.3

## 2013-03-11 LAB — GLUCOSE, POCT (MANUAL RESULT ENTRY): POC Glucose: 137 mg/dl — AB (ref 70–99)

## 2013-03-11 LAB — IRON AND TIBC: Iron: 138 ug/dL (ref 42–165)

## 2013-03-11 NOTE — Progress Notes (Signed)
  Subjective:    Patient ID: Russell Gonzales, male    DOB: 08-03-1950, 62 y.o.   MRN: [id number]  HPI patient here for followup of blood high cholesterol. He is having no difficulty with his medications he denies any chest pain or shortness of breath and is feeling well to    Review of Systems     Objective:   Physical Exam  HEENT exam is unremarkable. His neck is supple. Chest is clear to both auscultation and percussion cardiac exam reveals a regular rate no murmurs or gallops appreciated      Assessment & Plan:  Blood pressures at goal no change in medication at present time to

## 2013-03-26 ENCOUNTER — Other Ambulatory Visit: Payer: Self-pay | Admitting: Emergency Medicine

## 2013-06-17 ENCOUNTER — Telehealth: Payer: Self-pay

## 2013-06-17 ENCOUNTER — Ambulatory Visit (INDEPENDENT_AMBULATORY_CARE_PROVIDER_SITE_OTHER): Payer: Medicare Other | Admitting: Emergency Medicine

## 2013-06-17 ENCOUNTER — Encounter: Payer: Self-pay | Admitting: Emergency Medicine

## 2013-06-17 VITALS — BP 150/96 | HR 82 | Temp 98.2°F | Resp 16 | Ht 65.5 in | Wt 253.2 lb

## 2013-06-17 DIAGNOSIS — R351 Nocturia: Secondary | ICD-10-CM

## 2013-06-17 DIAGNOSIS — I10 Essential (primary) hypertension: Secondary | ICD-10-CM

## 2013-06-17 DIAGNOSIS — E785 Hyperlipidemia, unspecified: Secondary | ICD-10-CM

## 2013-06-17 LAB — COMPLETE METABOLIC PANEL WITH GFR
ALT: 99 U/L — AB (ref 0–53)
AST: 60 U/L — ABNORMAL HIGH (ref 0–37)
Albumin: 4.2 g/dL (ref 3.5–5.2)
Alkaline Phosphatase: 83 U/L (ref 39–117)
BILIRUBIN TOTAL: 0.6 mg/dL (ref 0.3–1.2)
BUN: 15 mg/dL (ref 6–23)
CALCIUM: 8.9 mg/dL (ref 8.4–10.5)
CO2: 25 mEq/L (ref 19–32)
CREATININE: 0.81 mg/dL (ref 0.50–1.35)
Chloride: 103 mEq/L (ref 96–112)
GFR, Est African American: >89 mL/min
Glucose, Bld: 132 mg/dL — ABNORMAL HIGH (ref 70–99)
Potassium: 4.3 mEq/L (ref 3.5–5.3)
Sodium: 139 mEq/L (ref 135–145)
Total Protein: 7 g/dL (ref 6.0–8.3)

## 2013-06-17 LAB — CBC WITH DIFFERENTIAL/PLATELET
Basophils Absolute: 0 10*3/uL (ref 0.0–0.1)
Basophils Relative: 0 % (ref 0–1)
EOS ABS: 0.2 10*3/uL (ref 0.0–0.7)
Eosinophils Relative: 3 % (ref 0–5)
HEMATOCRIT: 46.4 % (ref 39.0–52.0)
Hemoglobin: 16.3 g/dL (ref 13.0–17.0)
LYMPHS ABS: 1.6 10*3/uL (ref 0.7–4.0)
Lymphocytes Relative: 29 % (ref 12–46)
MCH: 33.5 pg (ref 26.0–34.0)
MCHC: 35.1 g/dL (ref 30.0–36.0)
MCV: 95.5 fL (ref 78.0–100.0)
MONO ABS: 0.7 10*3/uL (ref 0.1–1.0)
Monocytes Relative: 12 % (ref 3–12)
Neutro Abs: 3.1 10*3/uL (ref 1.7–7.7)
Neutrophils Relative %: 56 % (ref 43–77)
PLATELETS: 198 10*3/uL (ref 150–400)
RBC: 4.86 MIL/uL (ref 4.22–5.81)
RDW: 14.3 % (ref 11.5–15.5)
WBC: 5.5 10*3/uL (ref 4.0–10.5)

## 2013-06-17 LAB — LIPID PANEL
CHOLESTEROL: 200 mg/dL (ref 0–200)
HDL: 59 mg/dL (ref >39)
LDL Cholesterol: 119 mg/dL — ABNORMAL HIGH (ref 0–99)
TRIGLYCERIDES: 111 mg/dL (ref <150)
Total CHOL/HDL Ratio: 3.4 Ratio
VLDL: 22 mg/dL (ref 0–40)

## 2013-06-17 LAB — IFOBT (OCCULT BLOOD): IFOBT: NEGATIVE

## 2013-06-17 LAB — PSA: PSA: 0.97 ng/mL (ref <=4.00)

## 2013-06-17 MED ORDER — LISINOPRIL 40 MG PO TABS: 40.0000 mg | ORAL_TABLET | Freq: Every day | ORAL | Status: DC

## 2013-06-17 MED ORDER — LISINOPRIL 20 MG PO TABS: 40.0000 mg | ORAL_TABLET | Freq: Every day | ORAL | Status: DC

## 2013-06-17 MED ORDER — PRAVASTATIN SODIUM 40 MG PO TABS: ORAL_TABLET | ORAL | Status: DC

## 2013-06-17 NOTE — Telephone Encounter (Signed)
Pharm faxed req for change from pravastatin to a [facility] part D covered alternative. They did not provide alternative, but the list that I have from last year included lovastatin, atorvastatin and simvastatin. Dr Everlene Farrier, do you want to try one of these?

## 2013-06-17 NOTE — Telephone Encounter (Signed)
Pharm sent req to change lisinopril Rx from 40 mg tabs to 20 mg tabs, 2 tabs QD #60 d/t it being much cheaper for pt. I will change Rx.

## 2013-06-17 NOTE — Progress Notes (Signed)
   Subjective:    Patient ID: Russell Gonzales, male    DOB: 1951/06/06, 63 y.o.   MRN: [id number]  HPI patient followup hypertension. He's been taking all of his medications. Blood pressures have been elevated recently. He has put on weight recently we have discussed the possibility of sleep apnea in the past but he has declined to have a sleep study . He has had some difficulty in his neck recently. He has been to the chiropractor for treatment. He has some numbness extending into left arm. He has a long history of neck and back problems   Review of Systems     Objective:   Physical Exam HEENT exam is unremarkable neck supple chest clear heart rate no murmurs. Blood pressure repeated was 160/100. There is tenderness over the left side of the neck. Motor strength is symmetrical reflexes are symmetrical.  Results for orders placed in visit on 06/17/13  IFOBT (OCCULT BLOOD)      Result Value Range   IFOBT Negative         Assessment & Plan:  blood pressure is not at goal. Lisinopril increased to 40 mg a day. Patient needs an appointment to see Dr. Fuller Plan for his colonoscopy. He can make his own appointment. I also advised him he needs to have a sleep study. I also set up a followup with his neck and arm pain if he continued to have problems

## 2013-06-17 NOTE — Telephone Encounter (Signed)
Okay to place the patient on Lipitor 20 mg one a day as long as he has not had problems with this medication in the past. He can have #30 refill x1 year

## 2013-06-20 MED ORDER — ATORVASTATIN CALCIUM 20 MG PO TABS: 20.0000 mg | ORAL_TABLET | Freq: Every day | ORAL | Status: DC

## 2013-06-20 NOTE — Telephone Encounter (Signed)
Pt reported he has never taken anything other than pravastatin and is willing to try Lipitor. I have sent this in and advised pt that if he has any adverse SEs to call me. Pt agreed.

## 2013-07-24 ENCOUNTER — Encounter: Payer: Self-pay | Admitting: Gastroenterology

## 2013-12-04 ENCOUNTER — Encounter: Payer: Self-pay | Admitting: Gastroenterology

## 2013-12-16 ENCOUNTER — Encounter: Payer: Self-pay | Admitting: Emergency Medicine

## 2013-12-16 ENCOUNTER — Ambulatory Visit (INDEPENDENT_AMBULATORY_CARE_PROVIDER_SITE_OTHER): Payer: Medicare Other | Admitting: Emergency Medicine

## 2013-12-16 VITALS — BP 133/89 | HR 83 | Temp 98.4°F | Resp 18 | Ht 65.75 in | Wt 251.2 lb

## 2013-12-16 DIAGNOSIS — E785 Hyperlipidemia, unspecified: Secondary | ICD-10-CM

## 2013-12-16 DIAGNOSIS — I10 Essential (primary) hypertension: Secondary | ICD-10-CM

## 2013-12-16 DIAGNOSIS — L989 Disorder of the skin and subcutaneous tissue, unspecified: Secondary | ICD-10-CM

## 2013-12-16 DIAGNOSIS — Z23 Encounter for immunization: Secondary | ICD-10-CM

## 2013-12-16 LAB — BASIC METABOLIC PANEL WITHOUT GFR
BUN: 20 mg/dL (ref 6–23)
CO2: 29 meq/L (ref 19–32)
Calcium: 8.9 mg/dL (ref 8.4–10.5)
Chloride: 101 meq/L (ref 96–112)
Creat: 0.95 mg/dL (ref 0.50–1.35)
GFR, Est African American: >89 mL/min
GFR, Est Non African American: 86 mL/min
Glucose, Bld: 146 mg/dL — ABNORMAL HIGH (ref 70–99)
Potassium: 4.9 meq/L (ref 3.5–5.3)
Sodium: 137 meq/L (ref 135–145)

## 2013-12-16 LAB — LIPID PANEL
Cholesterol: 150 mg/dL (ref 0–200)
HDL: 50 mg/dL
LDL Cholesterol: 75 mg/dL (ref 0–99)
Total CHOL/HDL Ratio: 3 ratio
Triglycerides: 124 mg/dL
VLDL: 25 mg/dL (ref 0–40)

## 2013-12-16 LAB — CBC WITH DIFFERENTIAL/PLATELET
Basophils Absolute: 0 K/uL (ref 0.0–0.1)
Basophils Relative: 0 % (ref 0–1)
Eosinophils Absolute: 0.1 K/uL (ref 0.0–0.7)
Eosinophils Relative: 2 % (ref 0–5)
HCT: 43.9 % (ref 39.0–52.0)
Hemoglobin: 15.3 g/dL (ref 13.0–17.0)
Lymphocytes Relative: 20 % (ref 12–46)
Lymphs Abs: 1.4 K/uL (ref 0.7–4.0)
MCH: 33.6 pg (ref 26.0–34.0)
MCHC: 34.9 g/dL (ref 30.0–36.0)
MCV: 96.3 fL (ref 78.0–100.0)
Monocytes Absolute: 0.7 K/uL (ref 0.1–1.0)
Monocytes Relative: 10 % (ref 3–12)
Neutro Abs: 4.7 K/uL (ref 1.7–7.7)
Neutrophils Relative %: 68 % (ref 43–77)
Platelets: 183 K/uL (ref 150–400)
RBC: 4.56 MIL/uL (ref 4.22–5.81)
RDW: 13.8 % (ref 11.5–15.5)
WBC: 6.9 K/uL (ref 4.0–10.5)

## 2013-12-16 MED ORDER — ZOSTER VACCINE LIVE 19400 UNT/0.65ML ~~LOC~~ SOLR: 0.6500 mL | Freq: Once | SUBCUTANEOUS | Status: DC

## 2013-12-16 NOTE — Progress Notes (Signed)
   Subjective:    Patient ID: Russell Gonzales, male    DOB: 11-Mar-1951, 63 y.o.   MRN: [id number]  HPI patient here to followup hypertension and hyperlipidemia. He is only been taking one of his Zestril rather than 2. He has been taking his lipid medicine. He complains of fatigue at the end of the day. He also would like removal of skin tags on his neck. He has skin tags also under both arms. And one on his scrotum.    Review of Systems     Objective:   Physical Exam HEENT exam is unremarkable neck supple chest clear heart regular rate no murmurs abdomen obese without masses. There were multiple skin tags along the neck which were treated with liquid nitrogen for 8 seconds each. He has skin tags beneath the left axilla and on the scrotum.         Assessment & Plan:  The patient's history and physical are consistent with sleep apnea. I advised him to have a sleep study but he is not willing at the present time. The skin tags were frozen routine labs were done. Will see what his cholesterol is doing. Blood pressure was borderline high but still acceptable

## 2014-01-20 ENCOUNTER — Encounter: Payer: Self-pay | Admitting: Emergency Medicine

## 2014-01-20 ENCOUNTER — Ambulatory Visit (INDEPENDENT_AMBULATORY_CARE_PROVIDER_SITE_OTHER): Payer: Medicare Other | Admitting: Emergency Medicine

## 2014-01-20 VITALS — BP 138/80 | HR 88 | Temp 98.8°F | Resp 16 | Ht 65.25 in | Wt 254.6 lb

## 2014-01-20 DIAGNOSIS — R739 Hyperglycemia, unspecified: Secondary | ICD-10-CM

## 2014-01-20 DIAGNOSIS — R7309 Other abnormal glucose: Secondary | ICD-10-CM

## 2014-01-20 DIAGNOSIS — D492 Neoplasm of unspecified behavior of bone, soft tissue, and skin: Secondary | ICD-10-CM

## 2014-01-20 LAB — POCT GLYCOSYLATED HEMOGLOBIN (HGB A1C): Hemoglobin A1C: 7

## 2014-01-20 NOTE — Progress Notes (Signed)
Subjective:    Patient ID: Russell Gonzales, male    DOB: May 26, 1951, 63 y.o.   MRN: [id number] This chart was scribed for Russell Russian, MD by Russell Gonzales, ED Scribe. The patient was seen in Room 26. The patient's care was started at 3:53 PM.   Chief Complaint  Patient presents with  . skin tag removal   Past Medical History  Diagnosis Date  . Allergy     seasonal  . Arthritis   . GERD (gastroesophageal reflux disease)   . Hyperlipidemia   . Hypertension    Current Outpatient Prescriptions on File Prior to Visit  Medication Sig Dispense Refill  . atorvastatin (LIPITOR) 20 MG tablet Take 1 tablet (20 mg total) by mouth daily.  30 tablet  11  . fish oil-omega-3 fatty acids 1000 MG capsule Take 1 g by mouth 2 (two) times daily.        Marland Kitchen ketoconazole (NIZORAL) 2 % cream       . lisinopril (PRINIVIL,ZESTRIL) 20 MG tablet Take 2 tablets (40 mg total) by mouth daily.  60 tablet  11  . OVER THE COUNTER MEDICATION OTC Iron Supplement taking everyday      . omeprazole (PRILOSEC) 40 MG capsule Take 1 capsule (40 mg total) by mouth daily.  30 capsule  11  . pravastatin (PRAVACHOL) 40 MG tablet TAKE ONE TABLET BY MOUTH EVERY DAY  30 tablet  11  . zoster vaccine live, PF, (ZOSTAVAX) 28786 UNT/0.65ML injection Inject 19,400 Units into the skin once.  1 each  0   Current Facility-Administered Medications on File Prior to Visit  Medication Dose Route Frequency Provider Last Rate Last Dose  . 0.9 %  sodium chloride infusion  500 mL Intravenous Continuous Ladene Artist, MD       Allergies  Allergen Reactions  . Aspirin     REACTION: hives  . Bc Fast Pain [Aspirin-Caffeine] Hives    HPI HPI Comments: Russell Gonzales is a 63 y.o. male who presents to the [facility] for skin tag removal from the left axillary and scrotum. Pt denies being allergic to any drugs.  Review of Systems  Constitutional: Negative for fever and chills.  Respiratory: Negative for shortness of  breath.   Gastrointestinal: Negative for nausea and vomiting.  Skin:       3 skin tags on left axilla.  Neurological: Negative for weakness.   Objective:  Triage Vitals: BP 138/80  Pulse 88  Temp(Src) 98.8 F (37.1 C) (Oral)  Resp 16  Ht 5' 5.25" (1.657 m)  Wt 254 lb 9.6 oz (115.486 kg)  BMI 42.06 kg/m2  SpO2 94%  Physical Exam CONSTITUTIONAL: Well developed/well nourished HEAD: Normocephalic/atraumatic EYES: EOMI/PERRL ENMT: Mucous membranes moist NECK: supple no meningeal signs SPINE:entire spine nontender CV: S1/S2 noted, no murmurs/rubs/gallops noted LUNGS: Lungs are clear to auscultation bilaterally, no apparent distress ABDOMEN: soft, nontender, no rebound or guarding GU:no cva tenderness NEURO: Pt is awake/alert, moves all extremitiesx4 EXTREMITIES: pulses normal, full ROM SKIN: warm, color normal to skin tags on the neck which were treated with liquid nitrogen 6 seconds x2. There are 3 skin tags in the left axilla. The lower one has a minimal amount of pigment these areas were nontender with 2 cc of 2% plain and removed by scissors. The lower lesion with pigment was sent. There is a similar lesion left side of the scrotum which was numbed with 2 cc of 2% plain after Betadine  prep. This lesion was also removed this went scissors and pickups. This lesion was also sent. Just above this area was a 2 mm pigmented lesion. This area was noted with a cc of 2% plain 3 mm punch was used and the lesion was biopsied. Patient tolerated this well. The bases of all these lesions were treated with silver nitrate cautery. PSYCH: no abnormalities of mood noted   Assessment & Plan:  3:57 PM- Patient informed of current plan for treatment and evaluation and agrees with plan at this time. Patient presents with skin tags of the neck. He also has a skin tag left side of the scrotum. There is also a pigmented lesion just above the scrotum on the left. These 3 lesions were sent for pathology. The  lesions of the neck were treated with cryotherapy his hemoglobin A1c is 7 he is given information about diabetes. He was advised to lose 10 pounds and see me in 3 months and we will decide about medications at that time. .I personally performed the services described in this documentation, which was scribed in my presence. The recorded information has been reviewed and is accurate.

## 2014-01-20 NOTE — Patient Instructions (Signed)

## 2014-02-09 ENCOUNTER — Ambulatory Visit (AMBULATORY_SURGERY_CENTER): Payer: Self-pay | Admitting: *Deleted

## 2014-02-09 VITALS — Ht 62.25 in | Wt 251.8 lb

## 2014-02-09 DIAGNOSIS — Z86010 Personal history of colonic polyps: Secondary | ICD-10-CM

## 2014-02-09 MED ORDER — MOVIPREP 100 G PO SOLR: ORAL | Status: DC

## 2014-02-09 NOTE — Progress Notes (Signed)
No allergies to eggs or soy. No problems with anesthesia.  Pt given Emmi instructions for colonoscopy  No oxygen use  No diet drug use  

## 2014-02-19 ENCOUNTER — Encounter: Payer: Self-pay | Admitting: Gastroenterology

## 2014-02-23 ENCOUNTER — Encounter: Payer: Self-pay | Admitting: Gastroenterology

## 2014-02-23 ENCOUNTER — Ambulatory Visit (AMBULATORY_SURGERY_CENTER): Payer: Medicare Other | Admitting: Gastroenterology

## 2014-02-23 VITALS — BP 139/80 | HR 63 | Temp 97.8°F | Resp 15 | Ht 62.25 in | Wt 251.0 lb

## 2014-02-23 DIAGNOSIS — Z86010 Personal history of colonic polyps: Secondary | ICD-10-CM

## 2014-02-23 DIAGNOSIS — D123 Benign neoplasm of transverse colon: Secondary | ICD-10-CM

## 2014-02-23 DIAGNOSIS — D124 Benign neoplasm of descending colon: Secondary | ICD-10-CM

## 2014-02-23 DIAGNOSIS — D126 Benign neoplasm of colon, unspecified: Secondary | ICD-10-CM

## 2014-02-23 MED ORDER — SODIUM CHLORIDE 0.9 % IV SOLN: 500.0000 mL | INTRAVENOUS | Status: DC

## 2014-02-23 NOTE — Progress Notes (Signed)
Procedure ends, to recovery, report given and VSS. 

## 2014-02-23 NOTE — Progress Notes (Signed)
Called to room to assist during endoscopic procedure.  Patient ID and intended procedure confirmed with present staff. Received instructions for my participation in the procedure from the performing physician.  

## 2014-02-23 NOTE — Op Note (Signed)
[facility]  [address] [place], [address]   COLONOSCOPY PROCEDURE REPORT PATIENT: Russell Gonzales, Russell Gonzales  MR#: [id number] BIRTHDATE: [birth year] , 84  yrs. old GENDER: Male ENDOSCOPIST: Ladene Artist, MD, [facility] PROCEDURE DATE:  02/23/2014 PROCEDURE:   Colonoscopy with biopsy and snare polypectomy First Screening Colonoscopy - Avg.  risk and is 50 yrs.  old or older - No.  Prior Negative Screening - Now for repeat screening. N/A  History of Adenoma - Now for follow-up colonoscopy & has been > or = to 3 yrs.  Yes hx of adenoma.  Has been 3 or more years since last colonoscopy.  Polyps Removed Today? Yes. ASA CLASS:   Class II INDICATIONS:Patient's personal history of adenomatous colon polyps.  MEDICATIONS: MAC sedation, administered by CRNA and propofol (Diprivan) 300mg  IV DESCRIPTION OF PROCEDURE:   After the risks benefits and alternatives of the procedure were thoroughly explained, informed consent was obtained.  A digital rectal exam revealed no abnormalities of the rectum.   The LB [id number]  endoscope was introduced through the anus and advanced to the cecum, which was identified by both the appendix and ileocecal valve. No adverse events experienced.   The quality of the prep was good, using MoviPrep  The instrument was then slowly withdrawn as the colon was fully examined.  COLON FINDINGS: A sessile polyp measuring 5 mm in size was found in the transverse colon.  A polypectomy was performed with a cold snare.  The resection was complete and the polyp tissue was completely retrieved.   Two sessile polyps measuring 4,7 mm in size were found in the descending colon.  A polypectomy was performed with cold forceps and with a cold snare, respectively.  The resection was complete and the polyp tissue was completely retrieved.   Mild diverticulosis was noted in the sigmoid colon. The colon was otherwise normal.  There was no diverticulosis, inflammation,  polyps or cancers unless previously stated. Retroflexed views revealed small internal hemorrhoids. The time to cecum=3 minutes 02 seconds.  Withdrawal time=12 minutes 36 seconds. The scope was withdrawn and the procedure completed. COMPLICATIONS: There were no complications.  ENDOSCOPIC IMPRESSION: 1.   Sessile polyp measuring 5 mm in the transverse colon; polypectomy performed with a cold snare 2.   Two sessile polyps measuring 4, 7 mm in the descending colon; polypectomy performed cold forceps and cold snare 3.   Mild diverticulosis in the sigmoid colon 4.   Small internal hemorrhoids  RECOMMENDATIONS: 1.  Await pathology results 2.  High fiber diet with liberal fluid intake. 3.  Repeat Colonoscopy in 5 years.  eSigned:  Ladene Artist, MD, [facility] 02/23/2014 10:42 AM

## 2014-02-23 NOTE — Patient Instructions (Signed)
Discharge instructions given with verbal understanding. Handouts on polyps,diverticulosis and hemorrhoids. Resume previous medications. YOU HAD AN ENDOSCOPIC PROCEDURE TODAY AT THE [facility]: Refer to the procedure report that was given to you for any specific questions about what was found during the examination.  If the procedure report does not answer your questions, please call your gastroenterologist to clarify.  If you requested that your care partner not be given the details of your procedure findings, then the procedure report has been included in a sealed envelope for you to review at your convenience later.  YOU SHOULD EXPECT: Some feelings of bloating in the abdomen. Passage of more gas than usual.  Walking can help get rid of the air that was put into your GI tract during the procedure and reduce the bloating. If you had a lower endoscopy (such as a colonoscopy or flexible sigmoidoscopy) you may notice spotting of blood in your stool or on the toilet paper. If you underwent a bowel prep for your procedure, then you may not have a normal bowel movement for a few days.  DIET: Your first meal following the procedure should be a light meal and then it is ok to progress to your normal diet.  A half-sandwich or bowl of soup is an example of a good first meal.  Heavy or fried foods are harder to digest and may make you feel nauseous or bloated.  Likewise meals heavy in dairy and vegetables can cause extra gas to form and this can also increase the bloating.  Drink plenty of fluids but you should avoid alcoholic beverages for 24 hours.  ACTIVITY: Your care partner should take you home directly after the procedure.  You should plan to take it easy, moving slowly for the rest of the day.  You can resume normal activity the day after the procedure however you should NOT DRIVE or use heavy machinery for 24 hours (because of the sedation medicines used during the test).    SYMPTOMS TO REPORT  IMMEDIATELY: A gastroenterologist can be reached at any hour.  During normal business hours, 8:30 AM to 5:00 PM Monday through Friday, call [phone].  After hours and on weekends, please call the GI answering service at [phone] who will take a message and have the physician on call contact you.   Following lower endoscopy (colonoscopy or flexible sigmoidoscopy):  Excessive amounts of blood in the stool  Significant tenderness or worsening of abdominal pains  Swelling of the abdomen that is new, acute  Fever of 100F or higher  FOLLOW UP: If any biopsies were taken you will be contacted by phone or by letter within the next 1-3 weeks.  Call your gastroenterologist if you have not heard about the biopsies in 3 weeks.  Our staff will call the home number listed on your records the next business day following your procedure to check on you and address any questions or concerns that you may have at that time regarding the information given to you following your procedure. This is a courtesy call and so if there is no answer at the home number and we have not heard from you through the emergency physician on call, we will assume that you have returned to your regular daily activities without incident.  SIGNATURES/CONFIDENTIALITY: You and/or your care partner have signed paperwork which will be entered into your electronic medical record.  These signatures attest to the fact that that the information above on your After Visit Summary   has been reviewed and is understood.  Full responsibility of the confidentiality of this discharge information lies with you and/or your care-partner. 

## 2014-02-24 ENCOUNTER — Telehealth: Payer: Self-pay | Admitting: *Deleted

## 2014-02-24 NOTE — Telephone Encounter (Signed)
  Follow up Call-  Call back number 02/23/2014  Post procedure Call Back phone  # [phone]  Permission to leave phone message Yes     Patient questions:  Message left to call us if necessary.

## 2014-03-01 ENCOUNTER — Encounter: Payer: Self-pay | Admitting: Gastroenterology

## 2014-03-13 ENCOUNTER — Telehealth: Payer: Self-pay | Admitting: Family Medicine

## 2014-03-13 ENCOUNTER — Other Ambulatory Visit: Payer: Self-pay | Admitting: Emergency Medicine

## 2014-03-13 DIAGNOSIS — IMO0002 Reserved for concepts with insufficient information to code with codable children: Secondary | ICD-10-CM

## 2014-03-13 NOTE — Telephone Encounter (Signed)
Call patient and let him know I have made him an appointment to see Dr. Amedeo Plenty  a hand surgeon to evaluate this

## 2014-03-13 NOTE — Telephone Encounter (Signed)
Patient called regarding cyst on R middle finger. He said he had it last time he was seen and Dr. Everlene Farrier told him it was some kind of cyst and not to worry about it at that time. At that time it was smaller. Since then it has continuously gotten bigger and it has started to hurt. He would like to know if he needs to come in or is there something else he can do. Please advise

## 2014-03-15 NOTE — Telephone Encounter (Signed)
Pt.notified

## 2014-03-17 ENCOUNTER — Encounter: Payer: Self-pay | Admitting: Gastroenterology

## 2014-04-30 ENCOUNTER — Ambulatory Visit: Payer: Medicare Other | Admitting: Emergency Medicine

## 2014-05-14 ENCOUNTER — Encounter: Payer: Self-pay | Admitting: Emergency Medicine

## 2014-05-14 ENCOUNTER — Ambulatory Visit (INDEPENDENT_AMBULATORY_CARE_PROVIDER_SITE_OTHER): Payer: Medicare Other | Admitting: Emergency Medicine

## 2014-05-14 VITALS — BP 160/100 | HR 82 | Temp 98.0°F | Resp 16 | Ht 65.5 in | Wt 236.0 lb

## 2014-05-14 DIAGNOSIS — M545 Low back pain, unspecified: Secondary | ICD-10-CM

## 2014-05-14 DIAGNOSIS — M67449 Ganglion, unspecified hand: Secondary | ICD-10-CM

## 2014-05-14 DIAGNOSIS — L729 Follicular cyst of the skin and subcutaneous tissue, unspecified: Secondary | ICD-10-CM

## 2014-05-14 DIAGNOSIS — Z23 Encounter for immunization: Secondary | ICD-10-CM

## 2014-05-14 DIAGNOSIS — R739 Hyperglycemia, unspecified: Secondary | ICD-10-CM

## 2014-05-14 LAB — POCT URINALYSIS DIPSTICK
Bilirubin, UA: NEGATIVE
Blood, UA: NEGATIVE
GLUCOSE UA: NEGATIVE
Ketones, UA: NEGATIVE
Leukocytes, UA: NEGATIVE
Nitrite, UA: NEGATIVE
PROTEIN UA: 30
SPEC GRAV UA: 1.025
Urobilinogen, UA: 0.2
pH, UA: 5

## 2014-05-14 LAB — GLUCOSE, POCT (MANUAL RESULT ENTRY): POC GLUCOSE: 105 mg/dL — AB (ref 70–99)

## 2014-05-14 LAB — POCT GLYCOSYLATED HEMOGLOBIN (HGB A1C): Hemoglobin A1C: 5.7

## 2014-05-14 NOTE — Progress Notes (Addendum)
This chart was scribed for [name], MD by Einar Pheasant, ED Scribe. This patient was seen in room 22 and the patient's care was started at 10:21 AM.   Subjective:    Patient ID: Russell Gonzales, male    DOB: [birth year], 63 y.o.   MRN: [id number]  Chief Complaint  Patient presents with  . Follow-up  . Elevated blood sugar    HPI Russell Gonzales is a 63 y.o. Male with a hx of cervical fusion and back surgery with rods and screws between 4 vertebrae in 2009.   Pt is here today for a 3 month follow up. Pt states that he is doing well today. He states that he has lost 20 lbs since his last visit. Pt states that he has been having trouble exercising since he pulled a muscle in his back. He does not recall what he did to aggravate his back. However, he has been applying warm and cold compresses to alleviate the discomfort. Pt states that his goal is to get down to 200 lbs.   Pt is also complaining of a cyst right beneath the nail bed of his right middle finger. He states that he popped the previous one but another one developed. Denies any fever, chills, nausea, emesis, abdominal pain, SOB, or chest pain.   Patient Active Problem List   Diagnosis Date Noted  . Other and unspecified hyperlipidemia 03/11/2013  . UNSPECIFIED ESSENTIAL HYPERTENSION 09/01/2009  . DIZZINESS 09/01/2009  . CHEST PAIN UNSPECIFIED 09/01/2009   Past Medical History  Diagnosis Date  . Allergy     seasonal  . Arthritis   . GERD (gastroesophageal reflux disease)   . Hyperlipidemia   . Hypertension   . Type 2 diabetes, diet controlled    Past Surgical History  Procedure Laterality Date  . Cervical fusion  2009  . Back surgery  2009    Has rods and screws between 4 vertebrae  . Colonoscopy    . Polypectomy    . Prostate biopsy  2012  . Ankle fracture surgery Left 1972    has screws and plates  . Cyst removal leg Left 1975    behind knee   Allergies  Allergen Reactions  . Aspirin     REACTION: hives    . Bc Fast Pain [Aspirin-Caffeine] Hives   Prior to Admission medications   Medication Sig Start Date End Date Taking? Authorizing Provider  atorvastatin (LIPITOR) 20 MG tablet Take 1 tablet (20 mg total) by mouth daily. 06/20/13  Yes [name], MD  Ferrous Sulfate (IRON) 325 (65 FE) MG TABS Take by mouth daily.   Yes Historical Provider, MD  fish oil-omega-3 fatty acids 1000 MG capsule Take 1 g by mouth 2 (two) times daily.     Yes Historical Provider, MD  ketoconazole (NIZORAL) 2 % cream  09/21/10  Yes Historical Provider, MD  lisinopril (PRINIVIL,ZESTRIL) 20 MG tablet Take 2 tablets (40 mg total) by mouth daily. 06/17/13  Yes [name], MD  zoster vaccine live, PF, (ZOSTAVAX) 34193 UNT/0.65ML injection Inject 19,400 Units into the skin once. Patient not taking: Reported on 05/14/2014 12/16/13   [name], MD   History   Social History  . Marital Status: Married    Spouse Name: N/A    Number of Children: N/A  . Years of Education: N/A   Occupational History  . Not on file.   Social History Main Topics  . Smoking status: Former Smoker --  30 years    Types: Cigars  . Smokeless tobacco: Never Used  . Alcohol Use: 12.6 oz/week    21 Not specified per week     Comment: wiskey  . Drug Use: No  . Sexual Activity: Not on file   Other Topics Concern  . Not on file   Social History Narrative     Review of Systems  Constitutional: Negative for appetite change and fatigue.  HENT: Negative for congestion, ear discharge and sinus pressure.   Eyes: Negative for discharge.  Respiratory: Negative for cough.   Cardiovascular: Negative for chest pain.  Gastrointestinal: Negative for abdominal pain and diarrhea.  Genitourinary: Negative for frequency and hematuria.  Musculoskeletal: Positive for back pain.  Skin: Negative for rash.  Neurological: Negative for seizures and headaches.  Psychiatric/Behavioral: Negative for hallucinations.       Objective:   Physical Exam   Constitutional: He appears well-developed and well-nourished. No distress.  HENT:  Head: Normocephalic and atraumatic.  Eyes: Conjunctivae are normal. Right eye exhibits no discharge. Left eye exhibits no discharge.  Neck: Neck supple.  Cardiovascular: Normal rate, regular rhythm and normal heart sounds.  Exam reveals no gallop and no friction rub.   No murmur heard. Pulmonary/Chest: Effort normal and breath sounds normal. No respiratory distress.  Abdominal: Soft. He exhibits no distension. There is no tenderness.  Musculoskeletal: He exhibits no edema or tenderness.  He has rods, scar which extend from lower thoracic spine to top of sacrum. Tender to palpation to paraspinal muscles of the upper back.   Neurological: He is alert.  Skin: Skin is warm and dry.  1.0 x 1.0 cm cyst to DIP joint right middle finger.   Psychiatric: He has a normal mood and affect. His behavior is normal. Thought content normal.  Nursing note and vitals reviewed.   Filed Vitals:   05/14/14 1014  BP: 160/100  Pulse: 82  Temp: 98 F (36.7 C)  TempSrc: Oral  Resp: 16  Height: 5' 5.5" (1.664 m)  Weight: 236 lb (107.049 kg)  SpO2: 96%   Results for orders placed or performed in visit on 05/14/14  POCT glucose (manual entry)  Result Value Ref Range   POC Glucose 105 (A) 70 - 99 mg/dl  POCT glycosylated hemoglobin (Hb A1C)  Result Value Ref Range   Hemoglobin A1C 5.7        Assessment & Plan:  With weight loss and died his hemoglobin A1c is now 5.7 down from 7. Recheck in 4 months. Referral made to orthopedist regarding the cyst on his right middle finger referral made to Dr. Loanne Drilling or his back surgeon regarding back pain. Repeat blood pressure was 146/84  I personally performed the services described in this documentation, which was scribed in my presence. The recorded information has been reviewed and is accurate.  I personally performed the services described in this documentation, which was  scribed in my presence. The recorded information has been reviewed and is accurate.

## 2014-05-14 NOTE — Addendum Note (Signed)
Addended by: Wyatt Haste on: 05/14/2014 11:33 AM   Modules accepted: Orders

## 2014-06-12 HISTORY — PX: POLYPECTOMY: SHX149

## 2014-06-30 ENCOUNTER — Other Ambulatory Visit: Payer: Self-pay | Admitting: Emergency Medicine

## 2014-07-01 ENCOUNTER — Other Ambulatory Visit: Payer: Self-pay | Admitting: Emergency Medicine

## 2014-07-28 ENCOUNTER — Other Ambulatory Visit: Payer: Self-pay | Admitting: Physician Assistant

## 2014-07-28 ENCOUNTER — Other Ambulatory Visit: Payer: Self-pay

## 2014-07-28 NOTE — Telephone Encounter (Signed)
Pt wanting a med refill on atorvastatin (LIPITOR) 20 MG tablet [[id number]]. He wants it sent to the [facility] on [address] in [place], [place]. Please advise at [phone]

## 2014-07-30 NOTE — Telephone Encounter (Signed)
Done

## 2014-08-02 ENCOUNTER — Other Ambulatory Visit: Payer: Self-pay | Admitting: Physician Assistant

## 2014-08-20 ENCOUNTER — Ambulatory Visit (INDEPENDENT_AMBULATORY_CARE_PROVIDER_SITE_OTHER): Payer: PPO | Admitting: Emergency Medicine

## 2014-08-20 ENCOUNTER — Encounter: Payer: Self-pay | Admitting: Emergency Medicine

## 2014-08-20 VITALS — BP 142/90 | HR 72 | Temp 98.3°F | Resp 16 | Ht 65.5 in | Wt 238.5 lb

## 2014-08-20 DIAGNOSIS — E785 Hyperlipidemia, unspecified: Secondary | ICD-10-CM | POA: Diagnosis not present

## 2014-08-20 DIAGNOSIS — I10 Essential (primary) hypertension: Secondary | ICD-10-CM

## 2014-08-20 DIAGNOSIS — R739 Hyperglycemia, unspecified: Secondary | ICD-10-CM

## 2014-08-20 LAB — LIPID PANEL
Cholesterol: 155 mg/dL (ref 0–200)
HDL: 69 mg/dL (ref >=40)
LDL CALC: 74 mg/dL (ref 0–99)
TRIGLYCERIDES: 60 mg/dL (ref <150)
Total CHOL/HDL Ratio: 2.2 Ratio
VLDL: 12 mg/dL (ref 0–40)

## 2014-08-20 LAB — BASIC METABOLIC PANEL WITH GFR
BUN: 13 mg/dL (ref 6–23)
CALCIUM: 8.9 mg/dL (ref 8.4–10.5)
CO2: 26 mEq/L (ref 19–32)
Chloride: 105 mEq/L (ref 96–112)
Creat: 0.67 mg/dL (ref 0.50–1.35)
Glucose, Bld: 118 mg/dL — ABNORMAL HIGH (ref 70–99)
Potassium: 4.3 mEq/L (ref 3.5–5.3)
Sodium: 138 mEq/L (ref 135–145)

## 2014-08-20 LAB — HEMOGLOBIN A1C
Hgb A1c MFr Bld: 5.8 % — ABNORMAL HIGH (ref <5.7)
MEAN PLASMA GLUCOSE: 120 mg/dL — AB (ref <117)

## 2014-08-20 MED ORDER — ATORVASTATIN CALCIUM 20 MG PO TABS: ORAL_TABLET | ORAL | Status: DC

## 2014-08-20 MED ORDER — LISINOPRIL 20 MG PO TABS: ORAL_TABLET | ORAL | Status: DC

## 2014-08-20 NOTE — Progress Notes (Deleted)
   Subjective:    Patient ID: Russell Gonzales, male    DOB: Sep 04, 1950, 64 y.o.   MRN: [id number]  HPI    Review of Systems     Objective:   Physical Exam        Assessment & Plan:

## 2014-08-20 NOTE — Progress Notes (Addendum)
Subjective:  This chart was scribed for Russell Russian, MD by Tamsen Roers, at [facility].  This patient was seen in room 23 and the patient's care was started at 10:25 AM.    Patient ID: Russell Gonzales, male    DOB: November 19, 1950, 64 y.o.   MRN: [id number]  HPI  HPI Comments: Russell Gonzales is a 64 y.o. male who presents to [facility] for medication refill for his blood pressure (ran out 3-4 weeks ago). He has diabetes under control and is here for a follow up. Patient notes that his back feels weak and has been doing work around his home.  Patient notes he has not lost any weight nor has he gained any weight.  Patient states he is taking 1 Lisinopril instead of 2 because he states he cannot stay awake when he takes 2. Patient does not take any baby aspirin. Patient has never had a sleep study done in the past and states that he sleeps well for about 4 hours per night. Patient is not a regular smoker and has a cigar occasionally. Patient is thinking about possibly moving to [place] within 6-8 months.   BP right arm: 160/100.      Patient Active Problem List   Diagnosis Date Noted  . Other and unspecified hyperlipidemia 03/11/2013  . UNSPECIFIED ESSENTIAL HYPERTENSION 09/01/2009  . DIZZINESS 09/01/2009  . CHEST PAIN UNSPECIFIED 09/01/2009   Past Medical History  Diagnosis Date  . Allergy     seasonal  . Arthritis   . GERD (gastroesophageal reflux disease)   . Hyperlipidemia   . Hypertension   . Type 2 diabetes, diet controlled    Past Surgical History  Procedure Laterality Date  . Cervical fusion  2009  . Back surgery  2009    Has rods and screws between 4 vertebrae  . Colonoscopy    . Polypectomy    . Prostate biopsy  2012  . Ankle fracture surgery Left 1972    has screws and plates  . Cyst removal leg Left 1975    behind knee   Allergies  Allergen Reactions  . Aspirin     REACTION: hives  . Bc Fast Pain [Aspirin-Caffeine]  Hives   Prior to Admission medications   Medication Sig Start Date End Date Taking? Authorizing Provider  atorvastatin (LIPITOR) 20 MG tablet TAKE ONE TABLET BY MOUTH ONCE DAILY.  "OV NEEDED FOR ADDITIONAL REFILLS" 07/29/14   Chelle S Jeffery, PA-C  Ferrous Sulfate (IRON) 325 (65 FE) MG TABS Take by mouth daily.    Historical Provider, MD  fish oil-omega-3 fatty acids 1000 MG capsule Take 1 g by mouth 2 (two) times daily.      Historical Provider, MD  ketoconazole (NIZORAL) 2 % cream  09/21/10   Historical Provider, MD  lisinopril (PRINIVIL,ZESTRIL) 20 MG tablet TAKE TWO TABLETS BY MOUTH ONCE DAILY 07/01/14   Chelle S Jeffery, PA-C  zoster vaccine live, PF, (ZOSTAVAX) 14431 UNT/0.65ML injection Inject 19,400 Units into the skin once. Patient not taking: Reported on 05/14/2014 12/16/13   Russell Russian, MD   History   Social History  . Marital Status: Married    Spouse Name: N/A  . Number of Children: N/A  . Years of Education: N/A   Occupational History  . Not on file.   Social History Main Topics  . Smoking status: Former Smoker -- 30 years    Types: Cigars  . Smokeless  tobacco: Never Used  . Alcohol Use: 12.6 oz/week    21 Standard drinks or equivalent per week     Comment: wiskey  . Drug Use: No  . Sexual Activity: Not on file   Other Topics Concern  . Not on file   Social History Narrative    Review of Systems  Constitutional: Positive for fatigue. Negative for fever, chills and unexpected weight change.  HENT: Negative for nosebleeds.   Musculoskeletal: Positive for back pain.       Objective:   Physical Exam CONSTITUTIONAL: Well developed/well nourished HEAD: Normocephalic/atraumatic EYES: EOMI/PERRL ENMT: Mucous membranes moist NECK: supple no meningeal signs SPINE/BACK: Large scar over the entire lumbar spine. He harrington rods. 2 by 3 cm sebaceous cyst mid thoracic area.  CV: S1/S2 noted, no murmurs/rubs/gallops noted LUNGS: Lungs are clear to auscultation  bilaterally, no apparent distress ABDOMEN: soft, nontender, no rebound or guarding, bowel sounds noted throughout abdomen GU:no cva tenderness NEURO: Pt is awake/alert/appropriate, moves all extremitiesx4.  No facial droop.   EXTREMITIES: pulses normal/equal, full ROM SKIN: warm, color normal PSYCH: no abnormalities of mood noted, alert and oriented to situation     Filed Vitals:   08/20/14 1007  BP: 142/90  Pulse: 72  Temp: 98.3 F (36.8 C)  TempSrc: Oral  Resp: 16  Height: 5' 5.5" (1.664 m)  Weight: 238 lb 8 oz (108.183 kg)  SpO2: 97%   Results for orders placed or performed in visit on 05/14/14  POCT glucose (manual entry)  Result Value Ref Range   POC Glucose 105 (A) 70 - 99 mg/dl  POCT glycosylated hemoglobin (Hb A1C)  Result Value Ref Range   Hemoglobin A1C 5.7   POCT urinalysis dipstick  Result Value Ref Range   Color, UA dark yellow    Clarity, UA clear    Glucose, UA neg    Bilirubin, UA neg    Ketones, UA neg    Spec Grav, UA 1.025    Blood, UA neg    pH, UA 5.0    Protein, UA 30    Urobilinogen, UA 0.2    Nitrite, UA neg    Leukocytes, UA Negative        Assessment & Plan:  He has not been taking his medications as prescribed. We will refill these and he will take 2 Zestril tablets a day.I personally performed the services described in this documentation, which was scribed in my presence. The recorded information has been reviewed and is accurate.

## 2014-09-17 ENCOUNTER — Ambulatory Visit: Payer: Medicare Other | Admitting: Emergency Medicine

## 2014-12-22 ENCOUNTER — Encounter: Payer: Self-pay | Admitting: Emergency Medicine

## 2014-12-22 ENCOUNTER — Ambulatory Visit (INDEPENDENT_AMBULATORY_CARE_PROVIDER_SITE_OTHER): Payer: PPO | Admitting: Emergency Medicine

## 2014-12-22 VITALS — BP 128/91 | HR 71 | Temp 98.0°F | Resp 16 | Ht 66.0 in | Wt 243.0 lb

## 2014-12-22 DIAGNOSIS — R739 Hyperglycemia, unspecified: Secondary | ICD-10-CM

## 2014-12-22 DIAGNOSIS — I10 Essential (primary) hypertension: Secondary | ICD-10-CM | POA: Diagnosis not present

## 2014-12-22 DIAGNOSIS — E785 Hyperlipidemia, unspecified: Secondary | ICD-10-CM

## 2014-12-22 LAB — LIPID PANEL
CHOL/HDL RATIO: 3 ratio
CHOLESTEROL: 158 mg/dL (ref 0–200)
HDL: 52 mg/dL (ref >=40)
LDL CALC: 89 mg/dL (ref 0–99)
Triglycerides: 86 mg/dL (ref <150)
VLDL: 17 mg/dL (ref 0–40)

## 2014-12-22 LAB — CBC WITH DIFFERENTIAL/PLATELET
Basophils Absolute: 0 10*3/uL (ref 0.0–0.1)
Basophils Relative: 0 % (ref 0–1)
EOS ABS: 0.2 10*3/uL (ref 0.0–0.7)
Eosinophils Relative: 3 % (ref 0–5)
HCT: 43.4 % (ref 39.0–52.0)
HEMOGLOBIN: 15 g/dL (ref 13.0–17.0)
Lymphocytes Relative: 37 % (ref 12–46)
Lymphs Abs: 2 10*3/uL (ref 0.7–4.0)
MCH: 32.8 pg (ref 26.0–34.0)
MCHC: 34.6 g/dL (ref 30.0–36.0)
MCV: 95 fL (ref 78.0–100.0)
MPV: 9.4 fL (ref 8.6–12.4)
Monocytes Absolute: 0.6 10*3/uL (ref 0.1–1.0)
Monocytes Relative: 12 % (ref 3–12)
Neutro Abs: 2.5 10*3/uL (ref 1.7–7.7)
Neutrophils Relative %: 48 % (ref 43–77)
PLATELETS: 186 10*3/uL (ref 150–400)
RBC: 4.57 MIL/uL (ref 4.22–5.81)
RDW: 13.7 % (ref 11.5–15.5)
WBC: 5.3 10*3/uL (ref 4.0–10.5)

## 2014-12-22 LAB — BASIC METABOLIC PANEL WITH GFR
BUN: 20 mg/dL (ref 6–23)
CALCIUM: 9.1 mg/dL (ref 8.4–10.5)
CHLORIDE: 107 meq/L (ref 96–112)
CO2: 24 meq/L (ref 19–32)
CREATININE: 0.8 mg/dL (ref 0.50–1.35)
GFR, Est African American: >89 mL/min
GLUCOSE: 107 mg/dL — AB (ref 70–99)
POTASSIUM: 4.8 meq/L (ref 3.5–5.3)
SODIUM: 139 meq/L (ref 135–145)

## 2014-12-22 LAB — POCT GLYCOSYLATED HEMOGLOBIN (HGB A1C): Hemoglobin A1C: 6

## 2014-12-22 NOTE — Progress Notes (Signed)
   Subjective:  This chart was scribed for Russell Queen, MD by Leandra Kern, Medical Scribe. This patient was seen in Room 21 and the patient's care was started at 8:13 AM.   Patient ID: Russell Gonzales, male    DOB: [birth year], 64 y.o.   MRN: [id number]  HPI HPI Comments: Russell Gonzales is a 64 y.o. male with a medical history of HTN and HLD, who presents to [facility] for a follow up.  Pt notes that he has been doing well. He states that he occasionally has shortness of breath, however he notes that it could be a result of lack of Physical exercise. He reports that he has been struggling with keeping his weight loss stable, he indicates that it keeps fluctuating between 230-240 lbs. He indicates that he does not have any active problems that are new to normal. He notes that he has been compliant with his Lipitor and Lisinopril medications. Pt denies having any chest pain episodes.   Pt reports that he quit smoking about 30 years ago.  Pt plans to travel soon.    Review of Systems  Respiratory: Positive for shortness of breath.   Cardiovascular: Negative for chest pain.      Objective:   Physical Exam  Constitutional: He is oriented to person, place, and time. He appears well-developed and well-nourished. No distress.  HENT:  Head: Normocephalic and atraumatic.  Eyes: EOM are normal. Pupils are equal, round, and reactive to light.  Neck: Neck supple.  Cardiovascular: Normal rate.   Pulmonary/Chest: Effort normal.  Neurological: He is alert and oriented to person, place, and time. No cranial nerve deficit.  Skin: Skin is warm and dry.  Psychiatric: He has a normal mood and affect. His behavior is normal.  Nursing note and vitals reviewed.  Blood pressure- Left arm/ resting: 140/88    Assessment & Plan:  1. Essential hypertension Blood pressure is right at close to goal. He is going to work on weight loss for this - CBC with Differential/Platelet - BASIC  METABOLIC PANEL WITH GFR  2. Hyperlipidemia He is on Lipitor daily we'll continue weight loss low-fat diet - Lipid panel  3. Hyperglycemia Hemoglobin A1c was 6. - POCT glycosylated hemoglobin (Hb A1C)   I personally performed the services described in this documentation, which was scribed in my presence. The recorded information has been reviewed and is accurate.  Russell Queen, MD  [facility]  12/22/2014 9:04 AM

## 2014-12-23 ENCOUNTER — Encounter: Payer: Self-pay | Admitting: Family Medicine

## 2015-03-16 ENCOUNTER — Encounter: Payer: Self-pay | Admitting: Emergency Medicine

## 2015-03-30 ENCOUNTER — Encounter: Payer: Self-pay | Admitting: Emergency Medicine

## 2015-03-30 ENCOUNTER — Ambulatory Visit (INDEPENDENT_AMBULATORY_CARE_PROVIDER_SITE_OTHER): Payer: PPO | Admitting: Emergency Medicine

## 2015-03-30 VITALS — BP 154/92 | HR 70 | Temp 97.9°F | Resp 16 | Ht 66.0 in | Wt 246.0 lb

## 2015-03-30 DIAGNOSIS — E785 Hyperlipidemia, unspecified: Secondary | ICD-10-CM | POA: Diagnosis not present

## 2015-03-30 DIAGNOSIS — Z0000 Encounter for general adult medical examination without abnormal findings: Secondary | ICD-10-CM

## 2015-03-30 DIAGNOSIS — M25561 Pain in right knee: Secondary | ICD-10-CM

## 2015-03-30 DIAGNOSIS — I10 Essential (primary) hypertension: Secondary | ICD-10-CM

## 2015-03-30 DIAGNOSIS — Z23 Encounter for immunization: Secondary | ICD-10-CM | POA: Diagnosis not present

## 2015-03-30 DIAGNOSIS — Z114 Encounter for screening for human immunodeficiency virus [HIV]: Secondary | ICD-10-CM

## 2015-03-30 DIAGNOSIS — M25562 Pain in left knee: Secondary | ICD-10-CM | POA: Diagnosis not present

## 2015-03-30 DIAGNOSIS — R739 Hyperglycemia, unspecified: Secondary | ICD-10-CM

## 2015-03-30 LAB — LIPID PANEL
Cholesterol: 168 mg/dL (ref 125–200)
HDL: 61 mg/dL (ref >=40)
LDL Cholesterol: 87 mg/dL (ref <130)
TRIGLYCERIDES: 99 mg/dL (ref <150)
Total CHOL/HDL Ratio: 2.8 Ratio (ref <=5.0)
VLDL: 20 mg/dL (ref <30)

## 2015-03-30 LAB — COMPLETE METABOLIC PANEL WITH GFR
ALT: 64 U/L — AB (ref 9–46)
AST: 43 U/L — ABNORMAL HIGH (ref 10–35)
Albumin: 4.3 g/dL (ref 3.6–5.1)
Alkaline Phosphatase: 70 U/L (ref 40–115)
BUN: 17 mg/dL (ref 7–25)
CHLORIDE: 100 mmol/L (ref 98–110)
CO2: 28 mmol/L (ref 20–31)
CREATININE: 0.78 mg/dL (ref 0.70–1.25)
Calcium: 9.6 mg/dL (ref 8.6–10.3)
Glucose, Bld: 111 mg/dL — ABNORMAL HIGH (ref 65–99)
Potassium: 4.6 mmol/L (ref 3.5–5.3)
Sodium: 137 mmol/L (ref 135–146)
TOTAL PROTEIN: 7.1 g/dL (ref 6.1–8.1)
Total Bilirubin: 0.7 mg/dL (ref 0.2–1.2)

## 2015-03-30 LAB — CBC WITH DIFFERENTIAL/PLATELET
BASOS ABS: 0 10*3/uL (ref 0.0–0.1)
BASOS PCT: 0 % (ref 0–1)
EOS ABS: 0.2 10*3/uL (ref 0.0–0.7)
EOS PCT: 3 % (ref 0–5)
HCT: 45.4 % (ref 39.0–52.0)
Hemoglobin: 15.8 g/dL (ref 13.0–17.0)
Lymphocytes Relative: 36 % (ref 12–46)
Lymphs Abs: 1.8 10*3/uL (ref 0.7–4.0)
MCH: 33.8 pg (ref 26.0–34.0)
MCHC: 34.8 g/dL (ref 30.0–36.0)
MCV: 97 fL (ref 78.0–100.0)
MPV: 9.3 fL (ref 8.6–12.4)
Monocytes Absolute: 0.6 10*3/uL (ref 0.1–1.0)
Monocytes Relative: 12 % (ref 3–12)
NEUTROS PCT: 49 % (ref 43–77)
Neutro Abs: 2.5 10*3/uL (ref 1.7–7.7)
PLATELETS: 167 10*3/uL (ref 150–400)
RBC: 4.68 MIL/uL (ref 4.22–5.81)
RDW: 13.9 % (ref 11.5–15.5)
WBC: 5 10*3/uL (ref 4.0–10.5)

## 2015-03-30 LAB — POCT URINALYSIS DIP (MANUAL ENTRY)
BILIRUBIN UA: NEGATIVE
Glucose, UA: NEGATIVE
Leukocytes, UA: NEGATIVE
Nitrite, UA: NEGATIVE
Urobilinogen, UA: 0.2
pH, UA: 5

## 2015-03-30 LAB — HIV ANTIBODY (ROUTINE TESTING W REFLEX): HIV: NONREACTIVE

## 2015-03-30 LAB — POCT GLYCOSYLATED HEMOGLOBIN (HGB A1C): HEMOGLOBIN A1C: 6

## 2015-03-30 LAB — HEMOGLOBIN A1C: Hgb A1c MFr Bld: 6 % (ref 4.0–6.0)

## 2015-03-30 MED ORDER — LISINOPRIL 40 MG PO TABS: ORAL_TABLET | ORAL | Status: DC

## 2015-03-30 MED ORDER — ZOSTER VACCINE LIVE 19400 UNT/0.65ML ~~LOC~~ SOLR: 0.6500 mL | Freq: Once | SUBCUTANEOUS | Status: DC

## 2015-03-30 MED ORDER — HYDROCHLOROTHIAZIDE 12.5 MG PO CAPS: 12.5000 mg | ORAL_CAPSULE | Freq: Every day | ORAL | Status: DC

## 2015-03-30 NOTE — Progress Notes (Addendum)
Patient ID: Russell Gonzales, male   DOB: 01/14/1951, 64 y.o.   MRN: [id number]     This chart was scribed for Russell Queen, MD by Zola Button, Medical Scribe. This patient was seen in room 22 and the patient's care was started at 8:24 AM.   Chief Complaint:  Chief Complaint  Patient presents with  . Annual Exam    HPI: Russell Gonzales is a 64 y.o. male who reports to [facility] today for a complete physical exam. Patient has been having intermittent burning pain in his thighs. His back surgery was several years ago and thinks his thigh pain may be related. He reports associated weakness at times, feeling like his legs may give out. Patient denies abdominal pain and chest pain. He has not had his eyes checked recently.  Patient plans to move to [place], [place] after he sells his house.  Past Medical History  Diagnosis Date  . Allergy     seasonal  . Arthritis   . GERD (gastroesophageal reflux disease)   . Hyperlipidemia   . Hypertension   . Type 2 diabetes, diet controlled [facility])    Past Surgical History  Procedure Laterality Date  . Cervical fusion  2009  . Back surgery  2009    Has rods and screws between 4 vertebrae  . Colonoscopy    . Polypectomy    . Prostate biopsy  2012  . Ankle fracture surgery Left 1972    has screws and plates  . Cyst removal leg Left 1975    behind knee   Social History   Social History  . Marital Status: Married    Spouse Name: N/A  . Number of Children: N/A  . Years of Education: N/A   Social History Main Topics  . Smoking status: Former Smoker -- 30 years    Types: Cigars  . Smokeless tobacco: Never Used  . Alcohol Use: 12.6 oz/week    21 Standard drinks or equivalent per week     Comment: wiskey  . Drug Use: No  . Sexual Activity: Yes   Other Topics Concern  . None   Social History Narrative   Family History  Problem Relation Age of Onset  . Pancreatic cancer Mother   . Prostate cancer Father   . Colon cancer Neg Hx     Allergies  Allergen Reactions  . Aspirin     REACTION: hives  . Bc Fast Pain [Aspirin-Caffeine] Hives   Prior to Admission medications   Medication Sig Start Date End Date Taking? Authorizing Provider  atorvastatin (LIPITOR) 20 MG tablet TAKE ONE TABLET BY MOUTH ONCE DAILY. 08/20/14  Yes [name], MD  Ferrous Sulfate (IRON) 325 (65 FE) MG TABS Take by mouth daily.   Yes Historical Provider, MD  fish oil-omega-3 fatty acids 1000 MG capsule Take 1 g by mouth 2 (two) times daily.     Yes Historical Provider, MD  ketoconazole (NIZORAL) 2 % cream  09/21/10  Yes Historical Provider, MD  lisinopril (PRINIVIL,ZESTRIL) 20 MG tablet TAKE TWO TABLETS BY MOUTH ONCE DAILY 08/20/14  Yes [name], MD     ROS: The patient denies fevers, chills, night sweats, unintentional weight loss, chest pain, palpitations, wheezing, dyspnea on exertion, nausea, vomiting, abdominal pain, dysuria, hematuria, melena.   All other systems have been reviewed and were otherwise negative with the exception of those mentioned in the HPI and as above.    PHYSICAL EXAM: Filed Vitals:   03/30/15 0815  BP: 154/92  Pulse: 70  Temp:   Resp:    Body mass index is 39.72 kg/(m^2).   General: Alert, no acute distress HEENT:  Normocephalic, atraumatic, oropharynx patent. Eye: [facility] Cardiovascular:  Regular rate and rhythm, no rubs murmurs or gallops.  No Carotid bruits, radial pulse intact. No pedal edema.  Respiratory: Clear to auscultation bilaterally.  No wheezes, rales, or rhonchi.  No cyanosis, no use of accessory musculature Abdominal: No organomegaly, abdomen is soft and non-tender, positive bowel sounds.  No masses. Musculoskeletal: Gait intact. No edema, tenderness there is a large scar over his lower thoracic and lumbar spine. Skin: No rashes. Neurologic: Facial musculature symmetric. Psychiatric: Patient acts appropriately throughout our interaction. Lymphatic: No cervical or submandibular  lymphadenopathy Meds ordered this encounter  Medications  . zoster vaccine live, PF, (ZOSTAVAX) 55974 UNT/0.65ML injection    Sig: Inject 19,400 Units into the skin once.    Dispense:  1 each    Refill:  0  . lisinopril (PRINIVIL,ZESTRIL) 40 MG tablet    Sig: Take 1 tablet daily    Dispense:  30 tablet    Refill:  11  . hydrochlorothiazide (MICROZIDE) 12.5 MG capsule    Sig: Take 1 capsule (12.5 mg total) by mouth daily.    Dispense:  30 capsule    Refill:  11     LABS:  Results for orders placed or performed in visit on 03/30/15  POCT urinalysis dipstick  Result Value Ref Range   Color, UA yellow yellow   Clarity, UA clear clear   Glucose, UA negative negative   Bilirubin, UA negative negative   Ketones, POC UA trace (5) (A) negative   Spec Grav, UA >=1.030    Blood, UA trace-intact (A) negative   pH, UA 5.0    Protein Ur, POC trace (A) negative   Urobilinogen, UA 0.2    Nitrite, UA Negative Negative   Leukocytes, UA Negative Negative  POCT glycosylated hemoglobin (Hb A1C)  Result Value Ref Range   Hemoglobin A1C 6.0     EKG/XRAY:   Primary read interpreted by Dr. Everlene Farrier at [facility]. EKG has T-wave change with flattening V56 otherwise negative there is a mild sinus arrhythmia.   ASSESSMENT/PLAN: Patient is having some difficulty with a burning sensation on the top of both thighs. Blood pressure is elevated today. He has been taking lisinopril 40 already. Will add HCTZ 12.5 to get better blood pressure control. He has had back surgery in the past. We'll go ahead and refer back to Dr. Ellene Route.  By signing my name below, I, Zola Button, attest that this documentation has been prepared under the direction and in the presence of Russell Queen, MD.  Electronically Signed: Zola Button, Medical Scribe. 03/30/2015. 8:24 AM.   Johney Maine sideeffects, risk and benefits, and alternatives of medications d/w patient. Patient is aware that all medications have potential sideeffects and we are  unable to predict every sideeffect or drug-drug interaction that may occur.  Russell Queen MD 03/30/2015 8:24 AM

## 2015-03-30 NOTE — Patient Instructions (Signed)

## 2015-03-31 LAB — PSA: PSA: 1.35 ng/mL (ref <=4.00)

## 2015-04-05 ENCOUNTER — Encounter: Payer: Self-pay | Admitting: Family Medicine

## 2015-04-13 ENCOUNTER — Encounter: Payer: PPO | Admitting: Emergency Medicine

## 2015-04-23 ENCOUNTER — Other Ambulatory Visit: Payer: Self-pay | Admitting: Neurological Surgery

## 2015-04-23 DIAGNOSIS — G959 Disease of spinal cord, unspecified: Secondary | ICD-10-CM

## 2015-05-12 ENCOUNTER — Other Ambulatory Visit (HOSPITAL_COMMUNITY): Payer: Self-pay

## 2015-05-27 ENCOUNTER — Encounter (HOSPITAL_COMMUNITY): Payer: Self-pay

## 2015-05-27 ENCOUNTER — Ambulatory Visit (HOSPITAL_COMMUNITY)
Admission: RE | Admit: 2015-05-27 | Discharge: 2015-05-27 | Disposition: A | Discharge status: Home / Self Care | Payer: PPO | Source: Ambulatory Visit | Attending: Neurological Surgery | Admitting: Neurological Surgery

## 2015-05-27 ENCOUNTER — Other Ambulatory Visit (HOSPITAL_COMMUNITY): Payer: Self-pay

## 2015-05-27 ENCOUNTER — Inpatient Hospital Stay (HOSPITAL_COMMUNITY): Admission: RE | Admit: 2015-05-27 | Payer: Self-pay | Source: Ambulatory Visit

## 2015-05-27 DIAGNOSIS — K760 Fatty (change of) liver, not elsewhere classified: Secondary | ICD-10-CM | POA: Insufficient documentation

## 2015-05-27 DIAGNOSIS — R208 Other disturbances of skin sensation: Secondary | ICD-10-CM | POA: Diagnosis not present

## 2015-05-27 DIAGNOSIS — M4802 Spinal stenosis, cervical region: Secondary | ICD-10-CM | POA: Insufficient documentation

## 2015-05-27 DIAGNOSIS — M4804 Spinal stenosis, thoracic region: Secondary | ICD-10-CM | POA: Diagnosis not present

## 2015-05-27 DIAGNOSIS — M4716 Other spondylosis with myelopathy, lumbar region: Secondary | ICD-10-CM | POA: Insufficient documentation

## 2015-05-27 DIAGNOSIS — G959 Disease of spinal cord, unspecified: Secondary | ICD-10-CM | POA: Diagnosis not present

## 2015-05-27 DIAGNOSIS — M4806 Spinal stenosis, lumbar region: Secondary | ICD-10-CM | POA: Diagnosis not present

## 2015-05-27 DIAGNOSIS — M545 Low back pain: Secondary | ICD-10-CM | POA: Insufficient documentation

## 2015-05-27 DIAGNOSIS — M4726 Other spondylosis with radiculopathy, lumbar region: Secondary | ICD-10-CM | POA: Diagnosis not present

## 2015-05-27 DIAGNOSIS — Q7649 Other congenital malformations of spine, not associated with scoliosis: Secondary | ICD-10-CM | POA: Insufficient documentation

## 2015-05-27 DIAGNOSIS — Z981 Arthrodesis status: Secondary | ICD-10-CM | POA: Insufficient documentation

## 2015-05-27 DIAGNOSIS — M5136 Other intervertebral disc degeneration, lumbar region: Secondary | ICD-10-CM | POA: Diagnosis not present

## 2015-05-27 DIAGNOSIS — M419 Scoliosis, unspecified: Secondary | ICD-10-CM | POA: Insufficient documentation

## 2015-05-27 DIAGNOSIS — I700 Atherosclerosis of aorta: Secondary | ICD-10-CM | POA: Diagnosis not present

## 2015-05-27 DIAGNOSIS — M1288 Other specific arthropathies, not elsewhere classified, other specified site: Secondary | ICD-10-CM | POA: Insufficient documentation

## 2015-05-27 DIAGNOSIS — I714 Abdominal aortic aneurysm, without rupture: Secondary | ICD-10-CM | POA: Diagnosis not present

## 2015-05-27 MED ORDER — HYDROCODONE-ACETAMINOPHEN 5-325 MG PO TABS
ORAL_TABLET | ORAL | Status: AC
  Filled 2015-05-27: qty 1

## 2015-05-27 MED ORDER — DIAZEPAM 5 MG PO TABS
10.0000 mg | ORAL_TABLET | Freq: Once | ORAL | Status: AC
  Administered 2015-05-27: 10 mg via ORAL

## 2015-05-27 MED ORDER — LIDOCAINE HCL (PF) 1 % IJ SOLN
INTRAMUSCULAR | Status: AC
Start: 2015-05-27 — End: 2015-05-27
  Administered 2015-05-27: 1 mL via SUBCUTANEOUS
  Filled 2015-05-27: qty 5

## 2015-05-27 MED ORDER — ONDANSETRON HCL 4 MG/2ML IJ SOLN: 4.0000 mg | Freq: Four times a day (QID) | INTRAMUSCULAR | Status: DC | PRN

## 2015-05-27 MED ORDER — IOHEXOL 300 MG/ML  SOLN
10.0000 mL | Freq: Once | INTRAMUSCULAR | Status: AC | PRN
Start: 2015-05-27 — End: 2015-05-27
  Administered 2015-05-27: 10 mL via INTRATHECAL

## 2015-05-27 MED ORDER — DIAZEPAM 5 MG PO TABS
ORAL_TABLET | ORAL | Status: AC
  Administered 2015-05-27: 10 mg via ORAL
  Filled 2015-05-27: qty 2

## 2015-05-27 MED ORDER — HYDROCODONE-ACETAMINOPHEN 5-325 MG PO TABS
1.0000 | ORAL_TABLET | ORAL | Status: DC | PRN
  Administered 2015-05-27 (×2): 1 via ORAL

## 2015-05-27 NOTE — Procedures (Signed)
Russell Gonzales is a 64 year old individual who's had previous problems with his lumbar spine. He had a compression fracture of L3 with a degenerative scoliosis. About 7 years ago he underwent surgical decompression and arthrodesis from T12 down to S1. He did reasonably well for number of years but now has had increasing pain and weakness in his legs. He's had previous cervical spondylosis and a fusion there. Because of the extent of his arthropathies is advised that he should undergo a myelogram and a post milligrams CAT scan.  Pre op Dx: Lumbar spondylosis with myelopathy and radiculopathy Post op Dx: Lumbar spondylosis with myelopathy and radiculopathy Procedure: Total myelogram Surgeon: Ellene Route Puncture level: Iohexol 300. 10 mL, L3-L4 Fluid color: Clear colorless Injection: Iohexol 300. 10 mL Findings: Spondylosis throughout lumbar spine without defect to flow of dye, cervical spondylosis at level above previous arthrodesis at C5-C6. For evaluation with CT scan

## 2015-05-27 NOTE — Discharge Instructions (Signed)
Myelography, Care After °These instructions give you information on caring for yourself after your procedure. Your doctor may also give you more specific instructions. Call your doctor if you have any problems or questions after your procedure. °HOME CARE °· Rest the first day. °· When you rest, lie flat, with your head slightly raised (elevated). °· Avoid heavy lifting and activity for 48 hours, or as told by your doctor. °· You may take the bandage (dressing) off one day after the test, or as told by your doctor. °· Take all medicines only as told by your doctor. °· Ask your doctor when it is okay to take a shower or bath. °· Ask your doctor when your test results will be ready and how you can get them. Make sure you follow up and get your results. °· Do not drink alcohol for 24 hours, or as told by your doctor. °· Drink enough fluid to keep your pee (urine) clear or pale yellow. °GET HELP IF:  °· You have a fever. °· You have a headache. °· You feel sick to your stomach (nauseous) or throw up (vomit). °· You have pain or cramping in your belly (abdomen). °GET HELP RIGHT AWAY IF:  °· You have a headache with a stiff neck or fever. °· You have trouble breathing. °· Any of the places where the needles were put in are: °¨ Puffy (swollen) or red. °¨ Sore or hot to the touch. °¨ Draining yellowish-white fluid (pus). °¨ Bleeding. °MAKE SURE YOU: °· Understand these instructions. °· Will watch your condition. °· Will get help right away if you are not doing well or get worse. °  °This information is not intended to replace advice given to you by your health care provider. Make sure you discuss any questions you have with your health care provider. °  °Document Released: 03/07/2008 Document Revised: 06/19/2014 Document Reviewed: 02/21/2012 °Elsevier Interactive Patient Education ©2016 Elsevier Inc. ° °

## 2015-07-01 ENCOUNTER — Encounter: Payer: Self-pay | Admitting: Emergency Medicine

## 2015-07-01 ENCOUNTER — Ambulatory Visit (INDEPENDENT_AMBULATORY_CARE_PROVIDER_SITE_OTHER): Payer: PPO | Admitting: Emergency Medicine

## 2015-07-01 VITALS — BP 130/90 | HR 81 | Temp 98.0°F | Resp 16 | Ht 65.5 in | Wt 243.2 lb

## 2015-07-01 DIAGNOSIS — I714 Abdominal aortic aneurysm, without rupture, unspecified: Secondary | ICD-10-CM

## 2015-07-01 DIAGNOSIS — R208 Other disturbances of skin sensation: Secondary | ICD-10-CM

## 2015-07-01 DIAGNOSIS — R209 Unspecified disturbances of skin sensation: Secondary | ICD-10-CM

## 2015-07-01 DIAGNOSIS — R739 Hyperglycemia, unspecified: Secondary | ICD-10-CM

## 2015-07-01 DIAGNOSIS — G5793 Unspecified mononeuropathy of bilateral lower limbs: Secondary | ICD-10-CM

## 2015-07-01 DIAGNOSIS — I10 Essential (primary) hypertension: Secondary | ICD-10-CM

## 2015-07-01 LAB — POCT GLYCOSYLATED HEMOGLOBIN (HGB A1C): HEMOGLOBIN A1C: 6.2

## 2015-07-01 LAB — GLUCOSE, POCT (MANUAL RESULT ENTRY): POC Glucose: 135 mg/dl — AB (ref 70–99)

## 2015-07-01 NOTE — Progress Notes (Addendum)
Subjective:  This chart was scribed for [name], MD by Tamsen Roers, at [facility].  This patient was seen in room 21 and the patient's care was started at 10:04 AM.    Patient ID: [name], male    DOB: 01/10/1951, 65 y.o.   MRN: [id number] Chief Complaint  Patient presents with  . Follow-up  . Diabetes  . Hypertension  . Hyperlipidemia    HPI HPI Comments: HALIM CYPRET is a 64 y.o. male who presents to the [facility] for a follow up regarding his diabetes and hypertension.  Patient states that his legs have been feeling weak recently with a lot of unsteadiness when he first stands up- also feels a pop in his legs at times. He also feels like one of his feet feel warmer than the other. He denies any dizziness when this weakness in his legs occur.  He is complaint with his cholesterol and blood pressure medication and has doubled up on his fish oil as well.  Patient was told that he has arthritis in his back and that one of the screws in his vertebrae were also broken (but it was nothing to be concerned about).  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 Patient drinks everyday and thinks that sometimes its "more than he should".   Patient is going to [employer] and will be building a home there. He will be working there as well. He will be in [place] on and off for the next couple of months.    Patient Active Problem List   Diagnosis Date Noted  . Other and unspecified hyperlipidemia 03/11/2013  . UNSPECIFIED ESSENTIAL HYPERTENSION 09/01/2009  . DIZZINESS 09/01/2009  . CHEST PAIN UNSPECIFIED 09/01/2009   Past Medical History  Diagnosis Date  . Allergy     seasonal  . Arthritis   . GERD (gastroesophageal reflux disease)   . Hyperlipidemia   . Hypertension   . Type 2 diabetes, diet controlled [facility])    Past Surgical History  Procedure Laterality Date  . Cervical  fusion  2009  . Back surgery  2009    Has rods and screws between 4 vertebrae  . Colonoscopy    . Polypectomy    . Prostate biopsy  2012  . Ankle fracture surgery Left 1972    has screws and plates  . Cyst removal leg Left 1975    behind knee   Allergies  Allergen Reactions  . Aspirin     REACTION: hives  . Bc Fast Pain [Aspirin-Caffeine] Hives   Prior to Admission medications   Medication Sig Start Date End Date Taking? Authorizing Provider  atorvastatin (LIPITOR) 20 MG tablet TAKE ONE TABLET BY MOUTH ONCE DAILY. Patient taking differently: Take 20 mg by mouth daily at 6 PM.  08/20/14   [name], MD  Ferrous Sulfate (IRON) 325 (65 FE) MG TABS Take 325 mg by mouth daily.     Historical Provider, MD  fish oil-omega-3 fatty acids 1000 MG capsule Take 1 g by mouth 2 (two) times daily.      Historical Provider, MD  hydrochlorothiazide (MICROZIDE) 12.5 MG capsule Take 1 capsule (12.5 mg total) by mouth daily. 03/30/15   [name], MD  ibuprofen (ADVIL,MOTRIN) 400 MG tablet Take 400 mg by mouth every 6 (six) hours as needed for mild pain.    Historical Provider, MD  ketoconazole (NIZORAL)  2 % cream Apply 1 application topically daily as needed for irritation.  09/21/10   Historical Provider, MD  lisinopril (PRINIVIL,ZESTRIL) 40 MG tablet Take 1 tablet daily Patient taking differently: Take 40 mg by mouth daily.  03/30/15   [name], MD   Social History   Social History  . Marital Status: Married    Spouse Name: N/A  . Number of Children: N/A  . Years of Education: N/A   Occupational History  . Not on file.   Social History Main Topics  . Smoking status: Former Smoker -- 30 years    Types: Cigars  . Smokeless tobacco: Never Used  . Alcohol Use: 12.6 oz/week    21 Standard drinks or equivalent per week     Comment: wiskey  . Drug Use: No  . Sexual Activity: Yes   Other Topics Concern  . Not on file   Social History Narrative       Review of Systems    Constitutional: Negative for fever and chills.  Eyes: Negative for pain, redness and itching.  Respiratory: Negative for choking.   Gastrointestinal: Negative for nausea and vomiting.  Musculoskeletal: Negative for neck pain and neck stiffness.  Neurological: Positive for weakness. Negative for dizziness, seizures, syncope, speech difficulty and light-headedness.       Objective:   Physical Exam  Filed Vitals:   07/01/15 0953  BP: 130/90  Pulse: 81  Temp: 98 F (36.7 C)  TempSrc: Oral  Resp: 16  Height: 5' 5.5" (1.664 m)  Weight: 243 lb 3.2 oz (110.315 kg)  SpO2: 96%     CONSTITUTIONAL: Well developed/well nourished HEAD: Normocephalic/atraumatic EYES: EOMI/PERRL ENMT: Mucous membranes moist NECK: supple no meningeal signs SPINE/BACK:entire spine nontender CV: S1/S2 noted, no murmurs/rubs/gallops noted LUNGS: Lungs are clear to auscultation bilaterally, no apparent distress ABDOMEN: obese no masses.  GU:no cva tenderness NEURO: Pt is awake/alert/appropriate, moves allextremitiesx4.  No facial droop.   EXTREMITIES: Femoral pulses are 2+,  Left knee reflex 2+, absent right knee reflex.   Ankle reflexes are 2+ , DP PT are 2+, bilateral varicosities.  SKIN: warm, color normal PSYCH: no abnormalities of mood noted, alert and oriented to situation Results for orders placed or performed in visit on 07/01/15  POCT glucose (manual entry)  Result Value Ref Range   POC Glucose 135 (A) 70 - 99 mg/dl  POCT glycosylated hemoglobin (Hb A1C)  Result Value Ref Range   Hemoglobin A1C 6.2         Assessment & Plan:  We'll go ahead and schedule arterial Doppler of the lower extremities along with neuro evaluation to rule out neuropathy. 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 He has multilevel disease. Sugars stable with A1c is 6.2 he needs to work on weight loss. 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 No change in medications.I personally performed the services described in this documentation, which was scribed in my presence. The recorded information has been reviewed and is accurate.Nena Jordan MD I did suggest he try and cut back on his alcohol. I suggested he have an ultrasound of his abdominal aorta in one year instead of 3 years. If this ultrasound is stable then he can have an ultrasound done every 3 years. Nena Jordan MD

## 2015-07-23 ENCOUNTER — Emergency Department (HOSPITAL_COMMUNITY)
Admission: EM | Admit: 2015-07-23 | Discharge: 2015-07-23 | Disposition: A | Discharge status: Home / Self Care | Payer: PPO | Attending: Emergency Medicine | Admitting: Emergency Medicine

## 2015-07-23 ENCOUNTER — Telehealth: Payer: Self-pay | Admitting: Emergency Medicine

## 2015-07-23 ENCOUNTER — Emergency Department (HOSPITAL_COMMUNITY): Payer: PPO

## 2015-07-23 ENCOUNTER — Encounter (HOSPITAL_COMMUNITY): Payer: Self-pay | Admitting: Emergency Medicine

## 2015-07-23 DIAGNOSIS — Z79899 Other long term (current) drug therapy: Secondary | ICD-10-CM | POA: Diagnosis not present

## 2015-07-23 DIAGNOSIS — M1990 Unspecified osteoarthritis, unspecified site: Secondary | ICD-10-CM | POA: Insufficient documentation

## 2015-07-23 DIAGNOSIS — Z8719 Personal history of other diseases of the digestive system: Secondary | ICD-10-CM | POA: Diagnosis not present

## 2015-07-23 DIAGNOSIS — E785 Hyperlipidemia, unspecified: Secondary | ICD-10-CM | POA: Insufficient documentation

## 2015-07-23 DIAGNOSIS — R531 Weakness: Secondary | ICD-10-CM | POA: Diagnosis present

## 2015-07-23 DIAGNOSIS — Z87891 Personal history of nicotine dependence: Secondary | ICD-10-CM | POA: Insufficient documentation

## 2015-07-23 DIAGNOSIS — I10 Essential (primary) hypertension: Secondary | ICD-10-CM | POA: Insufficient documentation

## 2015-07-23 DIAGNOSIS — R29898 Other symptoms and signs involving the musculoskeletal system: Secondary | ICD-10-CM

## 2015-07-23 LAB — COMPREHENSIVE METABOLIC PANEL
ALT: 53 U/L (ref 17–63)
AST: 45 U/L — AB (ref 15–41)
Albumin: 3.3 g/dL — ABNORMAL LOW (ref 3.5–5.0)
Alkaline Phosphatase: 58 U/L (ref 38–126)
Anion gap: 11 (ref 5–15)
BILIRUBIN TOTAL: 0.5 mg/dL (ref 0.3–1.2)
BUN: 18 mg/dL (ref 6–20)
CO2: 24 mmol/L (ref 22–32)
CREATININE: 0.7 mg/dL (ref 0.61–1.24)
Calcium: 9.1 mg/dL (ref 8.9–10.3)
Chloride: 103 mmol/L (ref 101–111)
Glucose, Bld: 163 mg/dL — ABNORMAL HIGH (ref 65–99)
POTASSIUM: 4.1 mmol/L (ref 3.5–5.1)
Sodium: 138 mmol/L (ref 135–145)
TOTAL PROTEIN: 6.3 g/dL — AB (ref 6.5–8.1)

## 2015-07-23 LAB — CBC WITH DIFFERENTIAL/PLATELET
BASOS ABS: 0 10*3/uL (ref 0.0–0.1)
Basophils Relative: 0 %
EOS PCT: 2 %
Eosinophils Absolute: 0.1 10*3/uL (ref 0.0–0.7)
HEMATOCRIT: 42.7 % (ref 39.0–52.0)
Hemoglobin: 15.1 g/dL (ref 13.0–17.0)
LYMPHS ABS: 1.7 10*3/uL (ref 0.7–4.0)
LYMPHS PCT: 32 %
MCH: 33.9 pg (ref 26.0–34.0)
MCHC: 35.4 g/dL (ref 30.0–36.0)
MCV: 95.7 fL (ref 78.0–100.0)
MONO ABS: 0.4 10*3/uL (ref 0.1–1.0)
MONOS PCT: 8 %
NEUTROS ABS: 3.1 10*3/uL (ref 1.7–7.7)
Neutrophils Relative %: 58 %
Platelets: 160 10*3/uL (ref 150–400)
RBC: 4.46 MIL/uL (ref 4.22–5.81)
RDW: 13.1 % (ref 11.5–15.5)
WBC: 5.4 10*3/uL (ref 4.0–10.5)

## 2015-07-23 LAB — URINALYSIS, ROUTINE W REFLEX MICROSCOPIC
BILIRUBIN URINE: NEGATIVE
Glucose, UA: NEGATIVE mg/dL
HGB URINE DIPSTICK: NEGATIVE
Ketones, ur: NEGATIVE mg/dL
Leukocytes, UA: NEGATIVE
NITRITE: NEGATIVE
PROTEIN: NEGATIVE mg/dL
Specific Gravity, Urine: 1.02 (ref 1.005–1.030)
pH: 5.5 (ref 5.0–8.0)

## 2015-07-23 MED ORDER — LORAZEPAM 2 MG/ML IJ SOLN
1.0000 mg | Freq: Once | INTRAMUSCULAR | Status: AC
  Administered 2015-07-23: 1 mg via INTRAVENOUS
  Filled 2015-07-23: qty 1

## 2015-07-23 NOTE — ED Notes (Signed)
Pt returns from MRI. Wife at bedside.

## 2015-07-23 NOTE — ED Notes (Signed)
Patient transported to MRI 

## 2015-07-23 NOTE — Discharge Instructions (Signed)
MRI results: 1. No definite acute findings are identified within the spine status post C5-6 ACDF and lumbar fusion from L2 through S2. 2. No significant spinal stenosis within the cervical or upper thoracic spine. 3. The CSF surrounding the cord is chronically effaced at T11-12 with cord flattening, similar to previous myelogram. There is possible mild cord hyperintensity at this level which could reflect myelomalacia. 4. Stable findings in the lumbar spine with multilevel spondylosis, but no recurrent high-grade spinal stenosis or foraminal compromise.  Lumbosacral Radiculopathy Lumbosacral radiculopathy is a condition that involves the spinal nerves and nerve roots in the low back and bottom of the spine. The condition develops when these nerves and nerve roots move out of place or become inflamed and cause symptoms. CAUSES This condition may be caused by:  Pressure from a disk that bulges out of place (herniated disk). A disk is a plate of cartilage that separates bones in the spine.  Disk degeneration.  A narrowing of the bones of the lower back (spinal stenosis).  A tumor.  An infection.  An injury that places sudden pressure on the disks that cushion the bones of your lower spine. RISK FACTORS This condition is more likely to develop in:  Males aged 30-50 years.  Females aged 54-60 years.  People who lift improperly.  People who are overweight or live a sedentary lifestyle.  People who smoke.  People who perform repetitive activities that strain the spine. SYMPTOMS Symptoms of this condition include:  Pain that goes down from the back into the legs (sciatica). This is the most common symptom. The pain may be worse with sitting, coughing, or sneezing.  Pain and numbness in the arms and legs.  Muscle weakness.  Tingling.  Loss of bladder control or bowel control. DIAGNOSIS This condition is diagnosed with a physical exam and medical history. If the pain is  lasting, you may have tests, such as:  MRI scan.  X-ray.  CT scan.  Myelogram.  Nerve conduction study. TREATMENT This condition is often treated with:  Hot packs and ice applied to affected areas.  Stretches to improve flexibility.  Exercises to strengthen back muscles.  Physical therapy.  Pain medicine.  A steroid injection in the spine. In some cases, no treatment is needed. If the condition is long-lasting (chronic), or if symptoms are severe, treatment may involve surgery or lifestyle changes, such as following a weight loss plan. HOME CARE INSTRUCTIONS Medicines  Take medicines only as directed by your health care provider.  Do not drive or operate heavy machinery while taking pain medicine. Injury Care  Apply a heat pack to the injured area as directed by your health care provider.  Apply ice to the affected area:  Put ice in a plastic bag.  Place a towel between your skin and the bag.  Leave the ice on for 20-30 minutes, every 2 hours while you are awake or as needed. Or, leave the ice on for as long as directed by your health care provider. Other Instructions  If you were shown how to do any exercises or stretches, do them as directed by your health care provider.  If your health care provider prescribed a diet or exercise program, follow it as directed.  Keep all follow-up visits as directed by your health care provider. This is important. SEEK MEDICAL CARE IF:  Your pain does not improve over time even when taking pain medicines. SEEK IMMEDIATE MEDICAL CARE IF:  Your develop severe pain.  Your  pain suddenly gets worse.  You develop increasing weakness in your legs.  You lose the ability to control your bladder or bowel.  You have difficulty walking or balancing.  You have a fever.   This information is not intended to replace advice given to you by your health care provider. Make sure you discuss any questions you have with your health  care provider.   Document Released: 05/29/2005 Document Revised: 10/13/2014 Document Reviewed: 05/25/2014 Elsevier Interactive Patient Education [employer].

## 2015-07-23 NOTE — ED Notes (Signed)
Pt verbalizes understanding of instructions. 

## 2015-07-23 NOTE — ED Notes (Signed)
Pt reports bilateral leg weakness which has progressively gotten worse over 1 month. Pt hx of extensive back surgery hx. Pt alert x4.

## 2015-07-23 NOTE — ED Provider Notes (Signed)
Assumed care from [place], [place] at 1600. Please see her note for detailed H&P. Briefly, pt complains of BLE weakness and LBP for several months. Worse in the last few days. Positional. Exam strength is intact. Likely spinal stenosis. No red flags. Spoke with Neuro and would like to obtain MRI. If negative, pt cleared for discharge f/u with PCP/Neuro 2/24th.   MRI w/o acute findings. Stable lumbar MRI. No evidence to suggest cauda equina.  Discussed with the patient and all questioned fully answered. He will call me if any problems arise.  Patient discharged in stable condition. Strict return precautions given. He was instructed to follow-up with his primary care provider as well as his scheduled appointments to vascular surgeon and neurology.  Patient seen in conjunction with Dr. Rogene Houston.  Addison Lank, MD Resident     Addison Lank, MD 07/23/15 [id number]  Fredia Sorrow, MD 07/25/15 [phone]

## 2015-07-23 NOTE — Telephone Encounter (Signed)
I received a phone call from the patient. He has had progressive weakness and numbness in both legs since returning from [place]. Patient was advised to go to the emergency room. He is already scheduled for an appointment to be seen for arterial Dopplers. Our staff is actively working on his referral to neurology hopefully next week. I advised the patient to go to the emergency room for emergent evaluation to be sure it is safe to wait for his follow-up appointments next week for arterial Doppler and neurological evaluation.

## 2015-07-23 NOTE — ED Provider Notes (Signed)
CSN: [id number]     Arrival date & time 07/23/15  1103 History   First MD Initiated Contact with Patient 07/23/15 1240     Chief Complaint  Patient presents with  . Extremity Weakness     (Consider location/radiation/quality/duration/timing/severity/associated sxs/prior Treatment) HPI ATTILIO LANTHIER is a 65 y.o. male with PMH significant for allergy, arthritis, GERD, HLD, HTN, back pain s/p back surgery (2009) who presents with gradual onset, constant, worsening bilateral lower extremity weakness and tingling.  Patient has seen his PCP for this and arterial doppler and neurology consults are scheduled later this month.  However, patient reports his symptoms became worse of the last couple of days, prompting his ED visit.  Patient reports bilateral weakness most notably when going from sitting to a standing position.  He is ambulatory.  Relieving factors include bending forward. No head injury or LOC.  Denies bowel/bladder incontinence, AMS, fever, CP, SOB, abdominal pain, or urinary symptoms.  He takes ibuprofen for his back pain, and states that his weakness is worse when his back pain flares up.   Past Medical History  Diagnosis Date  . Allergy     seasonal  . Arthritis   . GERD (gastroesophageal reflux disease)   . Hyperlipidemia   . Hypertension    Past Surgical History  Procedure Laterality Date  . Cervical fusion  2009  . Back surgery  2009    Has rods and screws between 4 vertebrae  . Colonoscopy    . Polypectomy    . Prostate biopsy  2012  . Ankle fracture surgery Left 1972    has screws and plates  . Cyst removal leg Left 1975    behind knee   Family History  Problem Relation Age of Onset  . Pancreatic cancer Mother   . Prostate cancer Father   . Colon cancer Neg Hx    Social History  Substance Use Topics  . Smoking status: Former Smoker -- 30 years    Types: Cigars  . Smokeless tobacco: Never Used  . Alcohol Use: 12.6 oz/week    21 Standard drinks or  equivalent per week     Comment: wiskey    Review of Systems All other systems negative unless otherwise stated in HPI    Allergies  Aspirin and Bc fast pain  Home Medications   Prior to Admission medications   Medication Sig Start Date End Date Taking? Authorizing Provider  atorvastatin (LIPITOR) 20 MG tablet TAKE ONE TABLET BY MOUTH ONCE DAILY. Patient taking differently: Take 20 mg by mouth daily at 6 PM.  08/20/14   [name], MD  Ferrous Sulfate (IRON) 325 (65 FE) MG TABS Take 325 mg by mouth daily.     Historical Provider, MD  fish oil-omega-3 fatty acids 1000 MG capsule Take 1 g by mouth 2 (two) times daily.      Historical Provider, MD  hydrochlorothiazide (MICROZIDE) 12.5 MG capsule Take 1 capsule (12.5 mg total) by mouth daily. 03/30/15   [name], MD  ibuprofen (ADVIL,MOTRIN) 400 MG tablet Take 400 mg by mouth every 6 (six) hours as needed for mild pain.    Historical Provider, MD  ketoconazole (NIZORAL) 2 % cream Apply 1 application topically daily as needed for irritation. Reported on 07/01/2015 09/21/10   Historical Provider, MD  lisinopril (PRINIVIL,ZESTRIL) 40 MG tablet Take 1 tablet daily Patient taking differently: Take 40 mg by mouth daily.  03/30/15   [name], MD   BP 154/97  mmHg  Pulse 79  Temp(Src) 98.2 F (36.8 C) (Oral)  Resp 18  Ht 5\' 5"  (1.651 m)  Wt 108.863 kg  BMI 39.94 kg/m2  SpO2 96% Physical Exam  Constitutional: He is oriented to person, place, and time. He appears well-developed and well-nourished.  Non-toxic appearance. He does not have a sickly appearance. He does not appear ill.  HENT:  Head: Normocephalic and atraumatic.  Mouth/Throat: Oropharynx is clear and moist.  Eyes: Conjunctivae are normal. Pupils are equal, round, and reactive to light.  Neck: Normal range of motion. Neck supple.  Cardiovascular: Normal rate, regular rhythm and normal heart sounds.   No murmur heard. Pulses:      Dorsalis pedis pulses are 2+ on the  right side, and 2+ on the left side.  Pulmonary/Chest: Effort normal and breath sounds normal. No accessory muscle usage or stridor. No respiratory distress. He has no wheezes. He has no rhonchi. He has no rales.  Abdominal: Soft. Bowel sounds are normal. He exhibits no distension. There is no tenderness.  Musculoskeletal: Normal range of motion.  Lymphadenopathy:    He has no cervical adenopathy.  Neurological: He is alert and oriented to person, place, and time.  Speech clear without dysarthria.  5/5 strength in lower extremities bilaterally.  Sensation intact.  Skin: Skin is warm and dry.  Psychiatric: He has a normal mood and affect. His behavior is normal.    ED Course  Procedures (including critical care time) Labs Review Labs Reviewed  COMPREHENSIVE METABOLIC PANEL - Abnormal; Notable for the following:    Glucose, Bld 163 (*)    Total Protein 6.3 (*)    Albumin 3.3 (*)    AST 45 (*)    All other components within normal limits  CBC WITH DIFFERENTIAL/PLATELET  URINALYSIS, ROUTINE W REFLEX MICROSCOPIC (NOT AT [facility])    Imaging Review No results found. I have personally reviewed and evaluated these images and lab results as part of my medical decision-making.   EKG Interpretation None      MDM   Final diagnoses:  Weakness of both legs  Weakness of both legs    Patient presents with bilateral lower extremity weakness and tingling that has progressively worsened.  Hx of back surgery.  No bowel/bladder incontinence.  Worse going from sitting to standing.  Improved with bending over.  Has outpatient neurology and arterial dopplers scheduled. Patient is ambulatory.  VSS, NAD.  On exam, strength and sensation intact bilaterally throughout lower extremities.  2+ DP pulses.   High suspicion for spinal stenosis.  Low suspicion for vascular etiology given good peripheral pulses.  Low suspicion for muscular etiology given good strength.  Will obtain MR of thoracic, and lumbar spine  and consult neurology.  Per neurology, will add on MR cervical spine as well.   CBC, CMP, UA unremarkable. MRI cervical/thoracic/lumbar spine pending.  Anticipate discharge home with PCP follow up.  Patient care hand off to oncoming resident, Dr. Leonette Monarch, who will follow up on MR results and appropriate disposition.  Case has been discussed with and seen by Dr. Maryan Rued who agrees with the above plan.       Gloriann Loan, PA-C 07/23/15 1531  Blanchie Dessert, MD 07/23/15 1538

## 2015-07-23 NOTE — ED Notes (Signed)
To MRI

## 2015-07-24 ENCOUNTER — Telehealth: Payer: Self-pay | Admitting: Emergency Medicine

## 2015-07-24 NOTE — Telephone Encounter (Signed)
Patient's wife states that the patient has lost the ability to walk and Dr. Everlene Farrier suggested that he go to the ER. Wife said the ER did not do anything. She states that Windell Hummingbird, PA-C will be reviewing Daub's information while he is away so I am routing this to Judson Roch.   [phone] Daisey Must)

## 2015-07-24 NOTE — Telephone Encounter (Signed)
Call patient let them know I will be out of town next week. Be sure he keeps his follow-up appointments. He can see one of my partners if he has problems next week while I am out of town.

## 2015-07-26 ENCOUNTER — Other Ambulatory Visit: Payer: Self-pay | Admitting: Emergency Medicine

## 2015-07-26 ENCOUNTER — Ambulatory Visit (HOSPITAL_COMMUNITY)
Admission: RE | Admit: 2015-07-26 | Discharge: 2015-07-26 | Disposition: A | Discharge status: Home / Self Care | Payer: PPO | Source: Ambulatory Visit | Attending: Emergency Medicine | Admitting: Emergency Medicine

## 2015-07-26 DIAGNOSIS — R209 Unspecified disturbances of skin sensation: Secondary | ICD-10-CM

## 2015-07-26 DIAGNOSIS — I10 Essential (primary) hypertension: Secondary | ICD-10-CM | POA: Insufficient documentation

## 2015-07-26 DIAGNOSIS — E785 Hyperlipidemia, unspecified: Secondary | ICD-10-CM | POA: Insufficient documentation

## 2015-07-26 DIAGNOSIS — R208 Other disturbances of skin sensation: Secondary | ICD-10-CM | POA: Insufficient documentation

## 2015-07-27 NOTE — Telephone Encounter (Signed)
We have called neuro and we were unable to move up the appt but he is on a waiting list.  Pt is not worse - he has seen a chiropractor and he feels like that has helped some. He will RTC if he develops worsening symptoms.

## 2015-07-30 ENCOUNTER — Ambulatory Visit (INDEPENDENT_AMBULATORY_CARE_PROVIDER_SITE_OTHER): Payer: PPO | Admitting: Neurology

## 2015-07-30 ENCOUNTER — Encounter: Payer: Self-pay | Admitting: Neurology

## 2015-07-30 ENCOUNTER — Other Ambulatory Visit (INDEPENDENT_AMBULATORY_CARE_PROVIDER_SITE_OTHER): Payer: PPO

## 2015-07-30 VITALS — BP 148/88 | HR 85 | Ht 65.5 in | Wt 243.4 lb

## 2015-07-30 DIAGNOSIS — R261 Paralytic gait: Secondary | ICD-10-CM

## 2015-07-30 DIAGNOSIS — M4714 Other spondylosis with myelopathy, thoracic region: Secondary | ICD-10-CM

## 2015-07-30 LAB — VITAMIN B12: VITAMIN B 12: 413 pg/mL (ref 211–911)

## 2015-07-30 NOTE — Patient Instructions (Addendum)
1.  MRI brain without contrast 2.  Check blood work 3.  NCS/EMG of the legs  Return to clinic in 1 month

## 2015-07-30 NOTE — Progress Notes (Signed)
[facility] Clinic Note - Initial Visit   Date: 07/30/2015  Gonzales Russell MRN: [id number] DOB: 03-Jun-1951   Dear Dr. Everlene Farrier:  Thank you for your kind referral of Russell DUNSTAN for consultation of leg weakness. Although his history is well known to you, please allow [name] to reiterate it for the purpose of our medical record. The patient was accompanied to the clinic by wife who also provides collateral information.     History of Present Illness: Russell DELLEDONNE is a 65 y.o. right-handed Caucasian male with hypertension, hyperlipidemia, cervical ACDFand lumbar fusion L2-S2 presenting for evaluation of bilateral leg weakness and gait instability.    Around October 2016, he woke up with severe low back pain with associated anterior thigh burning pain.  Since then, he has a sensation of a mild electrical current over the legs. He underwent lumbar myelogram by Dr. Ellene Route in December 2016 which showed facet arthropathy at T11-T12, T12-L1, and L1-2 with patent canal, so did not see any surgical indication.  Early February, he started having difficulty walking and leg weakness, worse on the left.  He started using a cane one week ago which helps for his stability.  Denies muscle cramps.  Because of the acute change in his clinical status, he had urgent MRI cervical, thoracic, and lumbar spine on 07/22/2014 which shows cord flattening and CSF effacement at T11-T12 with mild cord hyperintensity.  He was referred here for further evaluation.   He denies any bowel/bladder incontinence.  Out-side paper records, electronic medical record, and images have been reviewed where available and summarized as:  MRI cervical, thoracic, and lumbar wo spine 07/23/2015: 1. No definite acute findings are identified within the spine status post C5-6 ACDF and lumbar fusion from L2 through S2. 2. No significant spinal stenosis within the cervical or upper thoracic spine. 3. The CSF surrounding the  cord is chronically effaced at T11-12 with cord flattening, similar to previous myelogram. There is possible mild cord hyperintensity at this level which could reflect myelomalacia. 4. Stable findings in the lumbar spine with multilevel spondylosis but no recurrent high-grade spinal stenosis or foraminal compromise.  Lab Results  Component Value Date   HGBA1C 6.2 07/01/2015     Past Medical History  Diagnosis Date  . Allergy     seasonal  . Arthritis   . GERD (gastroesophageal reflux disease)   . Hyperlipidemia   . Hypertension     Past Surgical History  Procedure Laterality Date  . Cervical fusion  2009  . Back surgery  2009    Has rods and screws between 4 vertebrae  . Colonoscopy    . Polypectomy    . Prostate biopsy  2012  . Ankle fracture surgery Left 1972    has screws and plates  . Cyst removal leg Left 1975    behind knee     Medications:  Outpatient Encounter Prescriptions as of 07/30/2015  Medication Sig  . atorvastatin (LIPITOR) 20 MG tablet TAKE ONE TABLET BY MOUTH ONCE DAILY. (Patient taking differently: Take 20 mg by mouth daily at 6 PM. )  . Ferrous Sulfate (IRON) 325 (65 FE) MG TABS Take 325 mg by mouth daily.   . fish oil-omega-3 fatty acids 1000 MG capsule Take 1 g by mouth 2 (two) times daily.    . hydrochlorothiazide (MICROZIDE) 12.5 MG capsule Take 1 capsule (12.5 mg total) by mouth daily.  Marland Kitchen ibuprofen (ADVIL,MOTRIN) 400 MG tablet Take 400 mg by mouth every  6 (six) hours as needed for mild pain.  Marland Kitchen ketoconazole (NIZORAL) 2 % cream Apply 1 application topically daily as needed for irritation. Reported on 07/01/2015  . lisinopril (PRINIVIL,ZESTRIL) 40 MG tablet Take 1 tablet daily (Patient taking differently: Take 40 mg by mouth daily. )  . Multiple Vitamins-Minerals (MULTIVITAMIN ADULT PO) Take by mouth.  . TURMERIC PO Take by mouth.   Facility-Administered Encounter Medications as of 07/30/2015  Medication  . 0.9 %  sodium chloride infusion      Allergies:  Allergies  Allergen Reactions  . Aspirin     REACTION: hives  . Bc Fast Pain [Aspirin-Caffeine] Hives    Family History: Family History  Problem Relation Age of Onset  . Pancreatic cancer Mother   . Prostate cancer Father   . Colon cancer Neg Hx     Social History: Social History  Substance Use Topics  . Smoking status: Former Smoker -- 30 years    Types: Cigars  . Smokeless tobacco: Never Used  . Alcohol Use: 12.6 oz/week    21 Standard drinks or equivalent per week     Comment: wiskey   Social History   Social History Narrative   Lives with wife and 3 dogs in a one story home.  Has 5 children.  Retired from Architect work.  Education: high school.    Review of Systems:  CONSTITUTIONAL: No fevers, chills, night sweats, or weight loss.   EYES: No visual changes or eye pain ENT: No hearing changes.  No history of nose bleeds.   RESPIRATORY: No cough, wheezing and shortness of breath.   CARDIOVASCULAR: Negative for chest pain, and palpitations.   GI: Negative for abdominal discomfort, blood in stools or black stools.  No recent change in bowel habits.   GU:  No history of incontinence.   MUSCLOSKELETAL: +history of joint pain or swelling.  No myalgias.   SKIN: Negative for lesions, rash, and itching.   HEMATOLOGY/ONCOLOGY: Negative for prolonged bleeding, bruising easily, and swollen nodes.  No history of cancer.   ENDOCRINE: Negative for cold or heat intolerance, polydipsia or goiter.   PSYCH:  No depression or anxiety symptoms.   NEURO: As Above.   Vital Signs:  BP 148/88 mmHg  Pulse 85  Ht 5' 5.5" (1.664 m)  Wt 243 lb 7 oz (110.423 kg)  BMI 39.88 kg/m2  SpO2 93%   General Medical Exam:   General:  Well appearing, comfortable.   Eyes/ENT: see cranial nerve examination.   Neck: No masses appreciated.  Full range of motion without tenderness.  No carotid bruits. Respiratory:  Clear to auscultation, good air entry bilaterally.    Cardiac:  Regular rate and rhythm, no murmur.   Extremities:  No deformities, edema, or skin discoloration.  Skin:  No rashes or lesions.  Neurological Exam: MENTAL STATUS including orientation to time, place, person, recent and remote memory, attention span and concentration, language, and fund of knowledge is normal.  Speech is not dysarthric.  CRANIAL NERVES: II:  No visual field defects.  Unremarkable fundi.   III-IV-VI: Pupils equal round and reactive to light.  Normal conjugate, extra-ocular eye movements in all directions of gaze.  No nystagmus.  No ptosis.   V:  Normal facial sensation.    VII:  Normal facial symmetry and movements.  No pathologic facial reflexes.  VIII:  Normal hearing and vestibular function.   IX-X:  Normal palatal movement.   XI:  Normal shoulder shrug and head rotation.  XII:  Normal tongue strength and range of motion, no deviation or fasciculation.  MOTOR:  No atrophy, fasciculations or abnormal movements.  No pronator drift.  Tone is normal.    Right Upper Extremity:    Left Upper Extremity:    Deltoid  5/5   Deltoid  5/5   Biceps  5/5   Biceps  5/5   Triceps  5/5   Triceps  5/5   Wrist extensors  5/5   Wrist extensors  5/5   Wrist flexors  5/5   Wrist flexors  5/5   Finger extensors  5/5   Finger extensors  5/5   Finger flexors  5/5   Finger flexors  5/5   Dorsal interossei  5/5   Dorsal interossei  5/5   Abductor pollicis  5/5   Abductor pollicis  5/5   Tone (Ashworth scale)  0  Tone (Ashworth scale)  0   Right Lower Extremity:    Left Lower Extremity:    Hip flexors  5/5   Hip flexors  4/5   Hip extensors  5/5   Hip extensors  4/5   Knee flexors  5/5   Knee flexors  4/5   Knee extensors  5/5   Knee extensors  5/5   Dorsiflexors  5/5   Dorsiflexors  5/5   Plantarflexors  5/5   Plantarflexors  5/5   Toe extensors  5/5   Toe extensors  5/5   Toe flexors  5/5   Toe flexors  5/5   Tone (Ashworth scale)  0+  Tone (Ashworth scale)  0+    MSRs:  Right                                                                 Left brachioradialis 1+  brachioradialis 1+  biceps 1+  biceps 1+  triceps 1+  triceps 1+  patellar 1+  patellar 2+  ankle jerk 2+  ankle jerk 2+  Hoffman no  Hoffman no  plantar response down  plantar response down  Nonsustained ankle clonus bilaterally  SENSORY:  Pin prick reduced in the legs bilaterally and vibration absent at the ankles.   COORDINATION/GAIT: Normal finger-to- nose-finger.  Finger tapping intact.  Toe tapping is slowed.  Gait appears spastic especially and unsteady.   IMPRESSION: Spastic-paraparetic gait.  Myelopathic features in the lower extremities is most concerning for spinal cord abnormality.  Imaging shows canal stenosis at T11-12 and possible hyperintensity of the cord at this level.  After reviewing his studies and correlating with his clinical exam, I do not see strong evidence of a super imposed neuropathy, but can certainly perform NCS/EMG of the legs, if needed.  Because of his upper motor neuron findings, imaging of the brain will be obtained but my overall suspicion for finding an intracranial lesion is low.  I would like to get Dr. Clarice Pole opinion again especially as his clinical exam has changed (now with left leg weakness and spastic gait) since his myelogram and we have updated imaging of MRI thoracic spine.   PLAN/RECOMMENDATIONS:  1.  Check vitamin B12, copper, zinc, vitamin E 2.  MRI brain wo contrast 3.  NCS/EMG of the legs can be considered going forward  Return to clinic in 1  month.   The duration of this appointment visit was 60 minutes of face-to-face time with the patient.  Greater than 50% of this time was spent in counseling, explanation of diagnosis, planning of further management, and coordination of care.   Thank you for allowing me to participate in patient's care.  If I can answer any additional questions, I would be pleased to do so.     Sincerely,    Addalynn Kumari K. Posey Pronto, DO

## 2015-08-02 LAB — COPPER, SERUM: COPPER: 77 ug/dL (ref 70–175)

## 2015-08-04 ENCOUNTER — Telehealth: Payer: Self-pay | Admitting: Neurology

## 2015-08-04 NOTE — Telephone Encounter (Signed)
Appointments cancelled.

## 2015-08-04 NOTE — Telephone Encounter (Signed)
Called patient and informed him that I had spoken with Dr. Ellene Route regarding his new left leg weakness and gait difficulty.  MRI imaging was reviewed and does show effacement of the CSF at T11-T12 with cord flattening at this level.  Because symptoms are most likely related to thoracic myelopathy at this level, I will cancel his NCS/EMG and MRI brain. He will follow-up with Dr. Ellene Route.  Donika K. Posey Pronto, DO

## 2015-08-06 ENCOUNTER — Ambulatory Visit: Payer: PPO | Admitting: Neurology

## 2015-08-06 LAB — ZINC: Zinc: 65 ug/dL (ref 60–130)

## 2015-08-06 LAB — VITAMIN E
Gamma-Tocopherol (Vit E): 2.3 mg/L (ref <=4.3)
Vitamin E (Alpha Tocopherol): 12.4 mg/L (ref 5.7–19.9)

## 2015-08-09 ENCOUNTER — Telehealth: Payer: Self-pay | Admitting: Neurology

## 2015-08-09 NOTE — Telephone Encounter (Signed)
Pt wife [name] called and wants to talk to someone because dr eliser office has not called them she wants to know the status please [phone] or [phone]

## 2015-08-09 NOTE — Telephone Encounter (Signed)
Called patient's wife myself.  Since Friday, he has been having worsening low back pain, left leg weakness, and now complaining of bowel incontinence.  He is taking prednisone which was started by Dr. Ellene Route last week, but noticed no benefit.  I instructed his wife that [name] needs to go to the ER immediately or call EMS, as these are signs of cord compression.   Donika K. Posey Pronto, DO

## 2015-08-09 NOTE — Telephone Encounter (Signed)
I called [name] back and she said that they may not be able to wait until March 2 to see Dr. Ellene Route.  She said patient is dragging left leg, is having severe lower back pain and is losing control of his bowels.  Instructed her to take him to the ER but she does not want to since they did not do anything last time.  Any suggestions?

## 2015-08-10 ENCOUNTER — Telehealth: Payer: Self-pay | Admitting: *Deleted

## 2015-08-10 ENCOUNTER — Other Ambulatory Visit: Payer: Self-pay | Admitting: Neurological Surgery

## 2015-08-10 ENCOUNTER — Encounter: Payer: PPO | Admitting: Neurology

## 2015-08-10 ENCOUNTER — Encounter (HOSPITAL_COMMUNITY): Payer: Self-pay | Admitting: *Deleted

## 2015-08-10 MED ORDER — ONDANSETRON HCL 4 MG/2ML IJ SOLN: 4.0000 mg | Freq: Four times a day (QID) | INTRAMUSCULAR | Status: DC | PRN

## 2015-08-10 NOTE — Telephone Encounter (Signed)
Called patient to see if he got in to see Dr. Ellene Route sooner than planned.  Got VM.  Instructed patient to call if he needs anything.

## 2015-08-10 NOTE — Telephone Encounter (Signed)
Patient's wife called me back to let us know that they were currently at Dr. Clarice Pole office seeing the doctor on call.  They will be going in tomorrow for a laminectomy.  She was very thankful and glad that you found out what was wrong with the patient.  She will keep [name] updated.

## 2015-08-10 NOTE — Progress Notes (Signed)
   08/10/15 1854  OBSTRUCTIVE SLEEP APNEA  Have you ever been diagnosed with sleep apnea through a sleep study? No  Do you snore loudly (loud enough to be heard through closed doors)?  0  Do you often feel tired, fatigued, or sleepy during the daytime (such as falling asleep during driving or talking to someone)? 0  Has anyone observed you stop breathing during your sleep? 0  Do you have, or are you being treated for high blood pressure? 1  BMI more than 35 kg/m2? 1  Age > 50 (1-yes) 1  Neck circumference greater than:Male 16 inches or larger, Male 17inches or larger? 1 (18.5)  Male Gender (Yes=1) 1  Obstructive Sleep Apnea Score 5  Score 5 or greater  Results sent to PCP

## 2015-08-11 ENCOUNTER — Other Ambulatory Visit: Payer: PPO

## 2015-08-11 ENCOUNTER — Ambulatory Visit (HOSPITAL_COMMUNITY): Payer: PPO

## 2015-08-11 ENCOUNTER — Ambulatory Visit (HOSPITAL_COMMUNITY): Payer: PPO | Admitting: Certified Registered Nurse Anesthetist

## 2015-08-11 ENCOUNTER — Encounter (HOSPITAL_COMMUNITY)
Admission: RE | Disposition: A | Discharge status: Home / Self Care | Payer: Self-pay | Source: Ambulatory Visit | Attending: Neurological Surgery

## 2015-08-11 ENCOUNTER — Ambulatory Visit (HOSPITAL_COMMUNITY)
Admission: RE | Admit: 2015-08-11 | Discharge: 2015-08-12 | Disposition: A | Discharge status: Home / Self Care | Payer: PPO | Source: Ambulatory Visit | Attending: Neurological Surgery | Admitting: Neurological Surgery

## 2015-08-11 ENCOUNTER — Encounter (HOSPITAL_COMMUNITY): Payer: Self-pay | Admitting: General Practice

## 2015-08-11 DIAGNOSIS — Z888 Allergy status to other drugs, medicaments and biological substances status: Secondary | ICD-10-CM | POA: Diagnosis not present

## 2015-08-11 DIAGNOSIS — Z6839 Body mass index (BMI) 39.0-39.9, adult: Secondary | ICD-10-CM | POA: Diagnosis not present

## 2015-08-11 DIAGNOSIS — K219 Gastro-esophageal reflux disease without esophagitis: Secondary | ICD-10-CM | POA: Diagnosis not present

## 2015-08-11 DIAGNOSIS — G9589 Other specified diseases of spinal cord: Secondary | ICD-10-CM | POA: Insufficient documentation

## 2015-08-11 DIAGNOSIS — Z419 Encounter for procedure for purposes other than remedying health state, unspecified: Secondary | ICD-10-CM

## 2015-08-11 DIAGNOSIS — E785 Hyperlipidemia, unspecified: Secondary | ICD-10-CM | POA: Insufficient documentation

## 2015-08-11 DIAGNOSIS — M1990 Unspecified osteoarthritis, unspecified site: Secondary | ICD-10-CM | POA: Diagnosis not present

## 2015-08-11 DIAGNOSIS — Z87891 Personal history of nicotine dependence: Secondary | ICD-10-CM | POA: Insufficient documentation

## 2015-08-11 DIAGNOSIS — M4804 Spinal stenosis, thoracic region: Secondary | ICD-10-CM | POA: Diagnosis not present

## 2015-08-11 DIAGNOSIS — R0602 Shortness of breath: Secondary | ICD-10-CM | POA: Diagnosis not present

## 2015-08-11 DIAGNOSIS — M4714 Other spondylosis with myelopathy, thoracic region: Secondary | ICD-10-CM

## 2015-08-11 DIAGNOSIS — I10 Essential (primary) hypertension: Secondary | ICD-10-CM | POA: Diagnosis not present

## 2015-08-11 DIAGNOSIS — Z91048 Other nonmedicinal substance allergy status: Secondary | ICD-10-CM | POA: Insufficient documentation

## 2015-08-11 DIAGNOSIS — Z886 Allergy status to analgesic agent status: Secondary | ICD-10-CM | POA: Diagnosis not present

## 2015-08-11 DIAGNOSIS — I739 Peripheral vascular disease, unspecified: Secondary | ICD-10-CM | POA: Diagnosis not present

## 2015-08-11 HISTORY — DX: Other chronic pain: G89.29

## 2015-08-11 HISTORY — DX: Unspecified hemorrhoids: K64.9

## 2015-08-11 HISTORY — DX: Low back pain: M54.5

## 2015-08-11 HISTORY — DX: Reserved for inherently not codable concepts without codable children: IMO0001

## 2015-08-11 HISTORY — PX: THORACIC LAMINECTOMY: SHX96

## 2015-08-11 HISTORY — DX: Other seasonal allergic rhinitis: J30.2

## 2015-08-11 HISTORY — PX: LUMBAR LAMINECTOMY/DECOMPRESSION MICRODISCECTOMY: SHX5026

## 2015-08-11 LAB — CBC
HEMATOCRIT: 47.4 % (ref 39.0–52.0)
HEMOGLOBIN: 17.1 g/dL — AB (ref 13.0–17.0)
MCH: 34.1 pg — ABNORMAL HIGH (ref 26.0–34.0)
MCHC: 36.1 g/dL — ABNORMAL HIGH (ref 30.0–36.0)
MCV: 94.4 fL (ref 78.0–100.0)
Platelets: 218 10*3/uL (ref 150–400)
RBC: 5.02 MIL/uL (ref 4.22–5.81)
RDW: 12.8 % (ref 11.5–15.5)
WBC: 12.7 10*3/uL — AB (ref 4.0–10.5)

## 2015-08-11 LAB — BASIC METABOLIC PANEL
ANION GAP: 11 (ref 5–15)
BUN: 25 mg/dL — AB (ref 6–20)
CHLORIDE: 103 mmol/L (ref 101–111)
CO2: 25 mmol/L (ref 22–32)
Calcium: 9.1 mg/dL (ref 8.9–10.3)
Creatinine, Ser: 0.78 mg/dL (ref 0.61–1.24)
GFR calc Af Amer: >60 mL/min (ref >60)
GFR calc non Af Amer: >60 mL/min (ref >60)
Glucose, Bld: 112 mg/dL — ABNORMAL HIGH (ref 65–99)
POTASSIUM: 4 mmol/L (ref 3.5–5.1)
SODIUM: 139 mmol/L (ref 135–145)

## 2015-08-11 LAB — SURGICAL PCR SCREEN
MRSA, PCR: NEGATIVE
Staphylococcus aureus: POSITIVE — AB

## 2015-08-11 SURGERY — LUMBAR LAMINECTOMY/DECOMPRESSION MICRODISCECTOMY 1 LEVEL
Anesthesia: General | Site: Back

## 2015-08-11 MED ORDER — TRAMADOL HCL 50 MG PO TABS
50.0000 mg | ORAL_TABLET | Freq: Four times a day (QID) | ORAL | Status: DC | PRN
  Administered 2015-08-11 – 2015-08-12 (×2): 50 mg via ORAL
  Filled 2015-08-11 (×2): qty 1

## 2015-08-11 MED ORDER — PROPOFOL 10 MG/ML IV BOLUS
INTRAVENOUS | Status: DC | PRN
  Administered 2015-08-11: 200 mg via INTRAVENOUS

## 2015-08-11 MED ORDER — MORPHINE SULFATE (PF) 2 MG/ML IV SOLN: 1.0000 mg | INTRAVENOUS | Status: DC | PRN

## 2015-08-11 MED ORDER — PROPOFOL 10 MG/ML IV BOLUS
INTRAVENOUS | Status: AC
  Filled 2015-08-11: qty 20

## 2015-08-11 MED ORDER — ACETAMINOPHEN 650 MG RE SUPP: 650.0000 mg | RECTAL | Status: DC | PRN

## 2015-08-11 MED ORDER — MIDAZOLAM HCL 5 MG/5ML IJ SOLN
INTRAMUSCULAR | Status: DC | PRN
  Administered 2015-08-11: 2 mg via INTRAVENOUS

## 2015-08-11 MED ORDER — ONDANSETRON HCL 4 MG/2ML IJ SOLN: 4.0000 mg | Freq: Four times a day (QID) | INTRAMUSCULAR | Status: DC | PRN

## 2015-08-11 MED ORDER — DEXAMETHASONE 4 MG PO TABS
4.0000 mg | ORAL_TABLET | Freq: Four times a day (QID) | ORAL | Status: DC
  Administered 2015-08-11 – 2015-08-12 (×2): 4 mg via ORAL
  Filled 2015-08-11 (×2): qty 1

## 2015-08-11 MED ORDER — THROMBIN 5000 UNITS EX SOLR
CUTANEOUS | Status: DC | PRN
  Administered 2015-08-11 (×2): 5000 [IU] via TOPICAL

## 2015-08-11 MED ORDER — NEOSTIGMINE METHYLSULFATE 10 MG/10ML IV SOLN
INTRAVENOUS | Status: AC
  Filled 2015-08-11: qty 1

## 2015-08-11 MED ORDER — ARTIFICIAL TEARS OP OINT
TOPICAL_OINTMENT | OPHTHALMIC | Status: DC | PRN
  Administered 2015-08-11: 1 via OPHTHALMIC

## 2015-08-11 MED ORDER — SODIUM CHLORIDE 0.9% FLUSH: 3.0000 mL | INTRAVENOUS | Status: DC | PRN

## 2015-08-11 MED ORDER — MENTHOL 3 MG MT LOZG: 1.0000 | LOZENGE | OROMUCOSAL | Status: DC | PRN

## 2015-08-11 MED ORDER — OXYCODONE HCL 5 MG PO TABS: 5.0000 mg | ORAL_TABLET | Freq: Once | ORAL | Status: DC | PRN

## 2015-08-11 MED ORDER — PHENOL 1.4 % MT LIQD: 1.0000 | OROMUCOSAL | Status: DC | PRN

## 2015-08-11 MED ORDER — DEXAMETHASONE SODIUM PHOSPHATE 10 MG/ML IJ SOLN
INTRAMUSCULAR | Status: AC
  Filled 2015-08-11: qty 1

## 2015-08-11 MED ORDER — ONDANSETRON HCL 4 MG/2ML IJ SOLN
INTRAMUSCULAR | Status: AC
  Filled 2015-08-11: qty 2

## 2015-08-11 MED ORDER — EPHEDRINE SULFATE 50 MG/ML IJ SOLN
INTRAMUSCULAR | Status: AC
  Filled 2015-08-11: qty 1

## 2015-08-11 MED ORDER — OXYCODONE HCL 5 MG/5ML PO SOLN: 5.0000 mg | Freq: Once | ORAL | Status: DC | PRN

## 2015-08-11 MED ORDER — BUPIVACAINE HCL (PF) 0.25 % IJ SOLN
INTRAMUSCULAR | Status: DC | PRN
  Administered 2015-08-11: 2 mL

## 2015-08-11 MED ORDER — LIDOCAINE HCL (CARDIAC) 20 MG/ML IV SOLN
INTRAVENOUS | Status: DC | PRN
  Administered 2015-08-11: 60 mg via INTRAVENOUS

## 2015-08-11 MED ORDER — SODIUM CHLORIDE 0.9% FLUSH
3.0000 mL | Freq: Two times a day (BID) | INTRAVENOUS | Status: DC
  Administered 2015-08-11: 3 mL via INTRAVENOUS

## 2015-08-11 MED ORDER — LIDOCAINE HCL (CARDIAC) 20 MG/ML IV SOLN
INTRAVENOUS | Status: AC
  Filled 2015-08-11: qty 5

## 2015-08-11 MED ORDER — LACTATED RINGERS IV SOLN
INTRAVENOUS | Status: DC
  Administered 2015-08-11 (×3): via INTRAVENOUS

## 2015-08-11 MED ORDER — SODIUM CHLORIDE 0.9 % IV SOLN: 250.0000 mL | INTRAVENOUS | Status: DC

## 2015-08-11 MED ORDER — FENTANYL CITRATE (PF) 100 MCG/2ML IJ SOLN
INTRAMUSCULAR | Status: DC | PRN
  Administered 2015-08-11: 100 ug via INTRAVENOUS
  Administered 2015-08-11: 50 ug via INTRAVENOUS

## 2015-08-11 MED ORDER — PHENYLEPHRINE 40 MCG/ML (10ML) SYRINGE FOR IV PUSH (FOR BLOOD PRESSURE SUPPORT)
PREFILLED_SYRINGE | INTRAVENOUS | Status: AC
  Filled 2015-08-11: qty 10

## 2015-08-11 MED ORDER — ACETAMINOPHEN 325 MG PO TABS: 650.0000 mg | ORAL_TABLET | ORAL | Status: DC | PRN

## 2015-08-11 MED ORDER — SUCCINYLCHOLINE CHLORIDE 20 MG/ML IJ SOLN
INTRAMUSCULAR | Status: AC
  Filled 2015-08-11: qty 1

## 2015-08-11 MED ORDER — PHENYLEPHRINE HCL 10 MG/ML IJ SOLN
10.0000 mg | INTRAVENOUS | Status: DC | PRN
  Administered 2015-08-11: 20 ug/min via INTRAVENOUS

## 2015-08-11 MED ORDER — ROCURONIUM BROMIDE 100 MG/10ML IV SOLN
INTRAVENOUS | Status: DC | PRN
Start: 2015-08-11 — End: 2015-08-11
  Administered 2015-08-11: 50 mg via INTRAVENOUS

## 2015-08-11 MED ORDER — SODIUM CHLORIDE 0.9 % IR SOLN
Status: DC | PRN
  Administered 2015-08-11: 14:00:00

## 2015-08-11 MED ORDER — METHOCARBAMOL 1000 MG/10ML IJ SOLN
500.0000 mg | Freq: Four times a day (QID) | INTRAVENOUS | Status: DC | PRN
  Filled 2015-08-11: qty 5

## 2015-08-11 MED ORDER — SUGAMMADEX SODIUM 200 MG/2ML IV SOLN
INTRAVENOUS | Status: DC | PRN
  Administered 2015-08-11: 220.4 mg via INTRAVENOUS

## 2015-08-11 MED ORDER — ONDANSETRON HCL 4 MG/2ML IJ SOLN
4.0000 mg | INTRAMUSCULAR | Status: DC | PRN
  Filled 2015-08-11: qty 2

## 2015-08-11 MED ORDER — MUPIROCIN 2 % EX OINT
1.0000 "application " | TOPICAL_OINTMENT | Freq: Once | CUTANEOUS | Status: AC
  Administered 2015-08-11: 1 via TOPICAL
  Filled 2015-08-11: qty 22

## 2015-08-11 MED ORDER — LISINOPRIL 20 MG PO TABS
40.0000 mg | ORAL_TABLET | Freq: Every day | ORAL | Status: DC
  Administered 2015-08-11 – 2015-08-12 (×2): 40 mg via ORAL
  Filled 2015-08-11 (×2): qty 2

## 2015-08-11 MED ORDER — MIDAZOLAM HCL 2 MG/2ML IJ SOLN
INTRAMUSCULAR | Status: AC
  Filled 2015-08-11: qty 2

## 2015-08-11 MED ORDER — 0.9 % SODIUM CHLORIDE (POUR BTL) OPTIME
TOPICAL | Status: DC | PRN
  Administered 2015-08-11: 1000 mL

## 2015-08-11 MED ORDER — SUGAMMADEX SODIUM 200 MG/2ML IV SOLN
INTRAVENOUS | Status: AC
  Filled 2015-08-11: qty 2

## 2015-08-11 MED ORDER — FERROUS SULFATE 325 (65 FE) MG PO TABS
325.0000 mg | ORAL_TABLET | Freq: Every day | ORAL | Status: DC
  Administered 2015-08-11 – 2015-08-12 (×2): 325 mg via ORAL
  Filled 2015-08-11 (×2): qty 1

## 2015-08-11 MED ORDER — IBUPROFEN 200 MG PO TABS
400.0000 mg | ORAL_TABLET | ORAL | Status: DC | PRN
  Filled 2015-08-11: qty 2

## 2015-08-11 MED ORDER — HEMOSTATIC AGENTS (NO CHARGE) OPTIME
TOPICAL | Status: DC | PRN
  Administered 2015-08-11: 1 via TOPICAL

## 2015-08-11 MED ORDER — THROMBIN 5000 UNITS EX SOLR
OROMUCOSAL | Status: DC | PRN
  Administered 2015-08-11: 10 mL via TOPICAL

## 2015-08-11 MED ORDER — HYDROCHLOROTHIAZIDE 12.5 MG PO CAPS
12.5000 mg | ORAL_CAPSULE | Freq: Every day | ORAL | Status: DC
  Administered 2015-08-12: 12.5 mg via ORAL
  Filled 2015-08-11 (×2): qty 1

## 2015-08-11 MED ORDER — POTASSIUM CHLORIDE IN NACL 20-0.9 MEQ/L-% IV SOLN
INTRAVENOUS | Status: DC
  Administered 2015-08-11: 18:00:00 via INTRAVENOUS
  Filled 2015-08-11: qty 1000

## 2015-08-11 MED ORDER — CEFAZOLIN SODIUM 1-5 GM-% IV SOLN
1.0000 g | Freq: Three times a day (TID) | INTRAVENOUS | Status: AC
  Administered 2015-08-11 – 2015-08-12 (×2): 1 g via INTRAVENOUS
  Filled 2015-08-11 (×2): qty 50

## 2015-08-11 MED ORDER — OXYCODONE-ACETAMINOPHEN 5-325 MG PO TABS: 1.0000 | ORAL_TABLET | ORAL | Status: DC | PRN

## 2015-08-11 MED ORDER — ROCURONIUM BROMIDE 50 MG/5ML IV SOLN
INTRAVENOUS | Status: AC
  Filled 2015-08-11: qty 1

## 2015-08-11 MED ORDER — GLYCOPYRROLATE 0.2 MG/ML IJ SOLN
INTRAMUSCULAR | Status: AC
  Filled 2015-08-11: qty 4

## 2015-08-11 MED ORDER — HYDROMORPHONE HCL 1 MG/ML IJ SOLN
INTRAMUSCULAR | Status: AC
  Filled 2015-08-11: qty 1

## 2015-08-11 MED ORDER — METHOCARBAMOL 500 MG PO TABS
500.0000 mg | ORAL_TABLET | Freq: Four times a day (QID) | ORAL | Status: DC | PRN
Start: 2015-08-11 — End: 2015-08-12
  Administered 2015-08-11: 500 mg via ORAL
  Filled 2015-08-11: qty 1

## 2015-08-11 MED ORDER — DEXAMETHASONE SODIUM PHOSPHATE 10 MG/ML IJ SOLN
10.0000 mg | INTRAMUSCULAR | Status: AC
  Administered 2015-08-11: 10 mg via INTRAVENOUS
  Filled 2015-08-11: qty 1

## 2015-08-11 MED ORDER — PHENYLEPHRINE HCL 10 MG/ML IJ SOLN
INTRAMUSCULAR | Status: DC | PRN
  Administered 2015-08-11: 80 ug via INTRAVENOUS
  Administered 2015-08-11: 40 ug via INTRAVENOUS
  Administered 2015-08-11: 80 ug via INTRAVENOUS

## 2015-08-11 MED ORDER — HYDROMORPHONE HCL 1 MG/ML IJ SOLN
0.2500 mg | INTRAMUSCULAR | Status: DC | PRN
  Administered 2015-08-11: 0.5 mg via INTRAVENOUS

## 2015-08-11 MED ORDER — DEXAMETHASONE SODIUM PHOSPHATE 4 MG/ML IJ SOLN
4.0000 mg | Freq: Four times a day (QID) | INTRAMUSCULAR | Status: DC
  Administered 2015-08-11 – 2015-08-12 (×2): 4 mg via INTRAVENOUS
  Filled 2015-08-11 (×2): qty 1

## 2015-08-11 MED ORDER — ONDANSETRON HCL 4 MG/2ML IJ SOLN
INTRAMUSCULAR | Status: DC | PRN
Start: 2015-08-11 — End: 2015-08-11
  Administered 2015-08-11: 4 mg via INTRAVENOUS

## 2015-08-11 MED ORDER — CEFAZOLIN SODIUM-DEXTROSE 2-3 GM-% IV SOLR
2.0000 g | INTRAVENOUS | Status: AC
  Administered 2015-08-11: 2 g via INTRAVENOUS
  Filled 2015-08-11: qty 50

## 2015-08-11 MED ORDER — FENTANYL CITRATE (PF) 250 MCG/5ML IJ SOLN
INTRAMUSCULAR | Status: AC
Start: 2015-08-11 — End: 2015-08-11
  Filled 2015-08-11: qty 5

## 2015-08-11 SURGICAL SUPPLY — 50 items
ADH SKN CLS APL DERMABOND .7 (GAUZE/BANDAGES/DRESSINGS) ×1
APL SKNCLS STERI-STRIP NONHPOA (GAUZE/BANDAGES/DRESSINGS) ×1
BAG DECANTER FOR FLEXI CONT (MISCELLANEOUS) ×3 IMPLANT
BENZOIN TINCTURE PRP APPL 2/3 (GAUZE/BANDAGES/DRESSINGS) ×3 IMPLANT
BUR MATCHSTICK NEURO 3.0 LAGG (BURR) ×3 IMPLANT
CANISTER SUCT 3000ML PPV (MISCELLANEOUS) ×3 IMPLANT
CLOSURE WOUND 1/2 X4 (GAUZE/BANDAGES/DRESSINGS) ×1
DERMABOND ADVANCED (GAUZE/BANDAGES/DRESSINGS) ×2
DERMABOND ADVANCED .7 DNX12 (GAUZE/BANDAGES/DRESSINGS) IMPLANT
DRAPE LAPAROTOMY 100X72X124 (DRAPES) ×3 IMPLANT
DRAPE MICROSCOPE LEICA (MISCELLANEOUS) ×3 IMPLANT
DRAPE POUCH INSTRU U-SHP 10X18 (DRAPES) ×3 IMPLANT
DRAPE SURG 17X23 STRL (DRAPES) ×3 IMPLANT
DRSG TELFA 3X8 NADH (GAUZE/BANDAGES/DRESSINGS) ×3 IMPLANT
DURAPREP 26ML APPLICATOR (WOUND CARE) ×3 IMPLANT
ELECT REM PT RETURN 9FT ADLT (ELECTROSURGICAL) ×3
ELECTRODE REM PT RTRN 9FT ADLT (ELECTROSURGICAL) ×1 IMPLANT
EVACUATOR 1/8 PVC DRAIN (DRAIN) ×2 IMPLANT
GAUZE SPONGE 4X4 16PLY XRAY LF (GAUZE/BANDAGES/DRESSINGS) IMPLANT
GLOVE BIO SURGEON STRL SZ8 (GLOVE) ×11 IMPLANT
GLOVE INDICATOR 7.0 STRL GRN (GLOVE) ×2 IMPLANT
GLOVE INDICATOR 7.5 STRL GRN (GLOVE) ×2 IMPLANT
GLOVE INDICATOR 8.5 STRL (GLOVE) ×6 IMPLANT
GOWN STRL REUS W/ TWL LRG LVL3 (GOWN DISPOSABLE) IMPLANT
GOWN STRL REUS W/ TWL XL LVL3 (GOWN DISPOSABLE) ×1 IMPLANT
GOWN STRL REUS W/TWL 2XL LVL3 (GOWN DISPOSABLE) IMPLANT
GOWN STRL REUS W/TWL LRG LVL3 (GOWN DISPOSABLE)
GOWN STRL REUS W/TWL XL LVL3 (GOWN DISPOSABLE) ×3
HEMOSTAT POWDER KIT SURGIFOAM (HEMOSTASIS) ×3 IMPLANT
KIT BASIN OR (CUSTOM PROCEDURE TRAY) ×3 IMPLANT
KIT ROOM TURNOVER OR (KITS) ×3 IMPLANT
NDL HYPO 25X1 1.5 SAFETY (NEEDLE) ×1 IMPLANT
NDL SPNL 20GX3.5 QUINCKE YW (NEEDLE) IMPLANT
NEEDLE HYPO 25X1 1.5 SAFETY (NEEDLE) ×3 IMPLANT
NEEDLE SPNL 20GX3.5 QUINCKE YW (NEEDLE) ×3 IMPLANT
NS IRRIG 1000ML POUR BTL (IV SOLUTION) ×3 IMPLANT
PACK LAMINECTOMY NEURO (CUSTOM PROCEDURE TRAY) ×3 IMPLANT
PAD ARMBOARD 7.5X6 YLW CONV (MISCELLANEOUS) ×9 IMPLANT
PAD DRESSING TELFA 3X8 NADH (GAUZE/BANDAGES/DRESSINGS) IMPLANT
RUBBERBAND STERILE (MISCELLANEOUS) ×6 IMPLANT
SPONGE SURGIFOAM ABS GEL SZ50 (HEMOSTASIS) ×3 IMPLANT
STRIP CLOSURE SKIN 1/2X4 (GAUZE/BANDAGES/DRESSINGS) ×2 IMPLANT
SUT VIC AB 0 CT1 18XCR BRD8 (SUTURE) ×1 IMPLANT
SUT VIC AB 0 CT1 8-18 (SUTURE) ×3
SUT VIC AB 2-0 CP2 18 (SUTURE) ×3 IMPLANT
SUT VIC AB 3-0 SH 8-18 (SUTURE) ×5 IMPLANT
TAPE PAPER MEDFIX 1IN X 10YD (GAUZE/BANDAGES/DRESSINGS) ×2 IMPLANT
TOWEL OR 17X24 6PK STRL BLUE (TOWEL DISPOSABLE) ×3 IMPLANT
TOWEL OR 17X26 10 PK STRL BLUE (TOWEL DISPOSABLE) ×3 IMPLANT
WATER STERILE IRR 1000ML POUR (IV SOLUTION) ×3 IMPLANT

## 2015-08-11 NOTE — Op Note (Signed)
08/11/2015  3:20 PM  PATIENT:  Russell Gonzales  65 y.o. male  PRE-OPERATIVE DIAGNOSIS:   Severe thoracic spinal stenosis with myelopathy  POST-OPERATIVE DIAGNOSIS:   same  PROCEDURE:   Decompressive thoracic laminectomy, medial facetectomy and foraminotomy T11 and T12  SURGEON:  Sherley Bounds, MD  ASSISTANTS:  Dr. Saintclair Halsted  ANESTHESIA:   General  EBL: 25 ml  Total I/O In: 1500 [I.V.:1500] Out: 20 [Blood:20]  BLOOD ADMINISTERED:none  DRAINS:  Medium Hemovac   SPECIMEN:  No Specimen  INDICATION FOR PROCEDURE:  This patient presented with rapid progressive date instability and weakness in his left leg with spasticity and hyperreflexia. CT myelogram in December and recent MRI showed significant spinal stenosis at T11-12 signal change in the spinal cord. He underwent previous lumbar fusion from L2-S1. Because of his rapid progression recommended urgent decompression.  Patient understood the risks, benefits, and alternatives and potential outcomes and wished to proceed.  PROCEDURE DETAILS: The patient was taken to the operating room and after induction of adequate generalized endotracheal anesthesia, the patient was rolled into the prone position on the Wilson frame and all pressure points were padded. The thoracic and lumbar region was cleaned and then prepped with DuraPrep and draped in the usual sterile fashion. 5 cc of local anesthesia was injected and then a dorsal midline incision was made and carried down to the  thoracic fascia. The fascia was opened and the paraspinous musculature was taken down in a subperiosteal fashion to expose  T11 and T12.  Numbering system is based on his preoperative CT scan. Intraoperative x-ray confirmed my level, and then I used a combination of the high-speed drill and the Kerrison punches to perform a laminectomy, medial facetectomy, and foraminotomy at  T11 and T12 bilaterally.  I drilled the lamina down until it was eggshell thin.The underlying yellow  ligament was opened and removed in a piecemeal fashion to expose the underlying dura.  I gently decompressed the canal with a 2 mm Kerrison punch. There was severe stenosis at T11-12 on the left narrowing the lateral recess severely. There was significant cord compression. I undercut the lateral recess.  I then checked a final x-ray to see my superior and inferior margins. The dura was fully capacious. I felt like from visualization that I had good decompression of the central canal lateral recesses. I irrigated with saline solution containing bacitracin. Achieved hemostasis with bipolar cautery, lined the dura with Gelfoam,  Placed a medium Hemovac drain through a separate stab incisionand then closed the fascia with 0 Vicryl. I closed the subcutaneous tissues with 2-0 Vicryl and the subcuticular tissues with 3-0 Vicryl. The skin was then closed with  Dermabond. The drapes were removed, a sterile dressing was applied to the drain. The patient was awakened from general anesthesia and transferred to the recovery room in stable condition. At the end of the procedure all sponge, needle and instrument counts were correct.   PLAN OF CARE: Admit to inpatient   PATIENT DISPOSITION:  PACU - hemodynamically stable.   Delay start of Pharmacological VTE agent (>24hrs) due to surgical blood loss or risk of bleeding:  yes

## 2015-08-11 NOTE — Anesthesia Preprocedure Evaluation (Signed)
Anesthesia Evaluation  Patient identified by MRN, date of birth, ID band Patient awake    Reviewed: Allergy & Precautions, NPO status , Patient's Chart, lab work & pertinent test results  Airway Mallampati: II   Neck ROM: full    Dental   Pulmonary shortness of breath, former smoker,    breath sounds clear to auscultation       Cardiovascular hypertension, + Peripheral Vascular Disease   Rhythm:regular Rate:Normal     Neuro/Psych    GI/Hepatic GERD  ,  Endo/Other  Morbid obesity  Renal/GU      Musculoskeletal  (+) Arthritis ,   Abdominal   Peds  Hematology   Anesthesia Other Findings   Reproductive/Obstetrics                             Anesthesia Physical Anesthesia Plan  ASA: II  Anesthesia Plan: General   Post-op Pain Management:    Induction: Intravenous  Airway Management Planned: Oral ETT  Additional Equipment:   Intra-op Plan:   Post-operative Plan: Extubation in OR  Informed Consent: I have reviewed the patients History and Physical, chart, labs and discussed the procedure including the risks, benefits and alternatives for the proposed anesthesia with the patient or authorized representative who has indicated his/her understanding and acceptance.     Plan Discussed with: CRNA, Anesthesiologist and Surgeon  Anesthesia Plan Comments:         Anesthesia Quick Evaluation

## 2015-08-11 NOTE — Transfer of Care (Signed)
Immediate Anesthesia Transfer of Care Note  Patient: Russell Gonzales  Procedure(s) Performed: Procedure(s): Laminectomy - Thoracic eleven - Thoracic tweleve (N/A)  Patient Location: PACU  Anesthesia Type:General  Level of Consciousness: awake, alert  and oriented  Airway & Oxygen Therapy: Patient Spontanous Breathing and Patient connected to nasal cannula oxygen  Post-op Assessment: Report given to RN and Post -op Vital signs reviewed and stable  Post vital signs: Reviewed and stable  Last Vitals:  Filed Vitals:   08/11/15 1026  BP: 111/82  Pulse: 73  Temp: 123XX123 C    Complications: No apparent anesthesia complications

## 2015-08-11 NOTE — Anesthesia Procedure Notes (Signed)
Procedure Name: Intubation Date/Time: 08/11/2015 1:52 PM Performed by: Rejeana Brock L Pre-anesthesia Checklist: Patient identified, Timeout performed, Emergency Drugs available, Suction available and Patient being monitored Patient Re-evaluated:Patient Re-evaluated prior to inductionOxygen Delivery Method: Circle system utilized Preoxygenation: Pre-oxygenation with 100% oxygen Intubation Type: IV induction Ventilation: Mask ventilation without difficulty and Oral airway inserted - appropriate to patient size Laryngoscope Size: Mac and 4 Grade View: Grade I Tube type: Oral Number of attempts: 1 Airway Equipment and Method: Stylet Placement Confirmation: ETT inserted through vocal cords under direct vision,  breath sounds checked- equal and bilateral and positive ETCO2 Secured at: 23 cm Tube secured with: Tape Dental Injury: Teeth and Oropharynx as per pre-operative assessment

## 2015-08-11 NOTE — H&P (Signed)
Subjective: Patient is a 65 y.o. male admitted for leg weakness, progressive, with gait instability. Onset of symptoms was several weeks ago, rapidly worsening since that time.  The pain is rated severe, and is located at the back and radiates to anterior thighs. The pain is described as aching and occurs intermittently. The symptoms have been progressive. Symptoms are exacerbated by exercise. 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 Allergy     seasonal  . Arthritis   . GERD (gastroesophageal reflux disease)   . Hyperlipidemia   . Hypertension   . Shortness of breath dyspnea     With exertion  . Hemorrhoids     Past Surgical History  Procedure Laterality Date  . Cervical fusion  2009  . Back surgery  2009    Has rods and screws between 4 vertebrae  . Colonoscopy    . Polypectomy    . Prostate biopsy  2012  . Ankle fracture surgery Left 1972    has screws and plates  . Cyst removal leg Left 1975    behind knee    Prior to Admission medications   Medication Sig Start Date End Date Taking? Authorizing Provider  atorvastatin (LIPITOR) 20 MG tablet TAKE ONE TABLET BY MOUTH ONCE DAILY. Patient taking differently: Take 20 mg by mouth daily at 6 PM.  08/20/14  Yes [name], MD  Ferrous Sulfate (IRON) 325 (65 FE) MG TABS Take 325 mg by mouth daily.    Yes Historical Provider, MD  fish oil-omega-3 fatty acids 1000 MG capsule Take 1 g by mouth 2 (two) times daily.     Yes Historical Provider, MD  hydrochlorothiazide (MICROZIDE) 12.5 MG capsule Take 1 capsule (12.5 mg total) by mouth daily. 03/30/15  Yes [name], MD  ibuprofen (ADVIL,MOTRIN) 400 MG tablet Take 400 mg by mouth 2 (two) times daily as needed for mild pain.    Yes Historical Provider, MD  ketoconazole (NIZORAL) 2 % cream Apply 1 application topically daily as needed for irritation. Reported on 07/01/2015 09/21/10  Yes Historical Provider, MD  lisinopril (PRINIVIL,ZESTRIL)  40 MG tablet Take 1 tablet daily Patient taking differently: Take 40 mg by mouth daily.  03/30/15  Yes [name], MD  Multiple Vitamins-Minerals (MULTIVITAMIN ADULT PO) Take 1 tablet by mouth daily.    Yes Historical Provider, MD  predniSONE (STERAPRED UNI-PAK 21 TAB) 10 MG (21) TBPK tablet Take 10 mg by mouth daily.   Yes Historical Provider, MD  TURMERIC PO Take 1,000 mg by mouth daily.    Yes Historical Provider, MD   Allergies  Allergen Reactions  . Aspirin Hives  . Bc Fast Pain [Aspirin-Caffeine] Hives  . Tape Rash    Surgical Tape- 2009ish  Back surgery - broke out- rash    Social History  Substance Use Topics  . Smoking status: Former Smoker -- 30 years    Types: Cigars  . Smokeless tobacco: Never Used  . Alcohol Use: 12.6 oz/week    21 Standard drinks or equivalent per week     Comment: wiskey2 drinks a day- 08/10/15    Family History  Problem Relation Age of Onset  . Pancreatic cancer Mother   . Prostate cancer Father   . Colon cancer Neg Hx      Review of Systems  Positive ROS: neg  All other systems have been reviewed and were otherwise negative with the exception of those  mentioned in the HPI and as above.  Objective: Vital signs in last 24 hours: Temp:  [97.8 F (36.6 C)] 97.8 F (36.6 C) (03/01 1026) Pulse Rate:  [73] 73 (03/01 1026) BP: (111)/(82) 111/82 mmHg (03/01 1026) SpO2:  [96 %] 96 % (03/01 1026) Weight:  [110.224 kg (243 lb)] 110.224 kg (243 lb) (03/01 1026)  General Appearance: Alert, cooperative, no distress, appears stated age Head: Normocephalic, without obvious abnormality, atraumatic Eyes: PERRL, conjunctiva/corneas clear, EOM's intact    Neck: Supple, symmetrical, trachea midline Back: Symmetric, no curvature, ROM normal, no CVA tenderness Lungs:  respirations unlabored Heart: Regular rate and rhythm Abdomen: Soft, non-tender Extremities: Extremities normal, atraumatic, no cyanosis or edema Pulses: 2+ and symmetric all  extremities Skin: Skin color, texture, turgor normal, no rashes or lesions  NEUROLOGIC:   Mental status: Alert and oriented x4,  no aphasia, good attention span, fund of knowledge, and memory Motor Exam - weakness of left leg but is antigravity Sensory Exam - grossly normal Reflexes: 3+ Coordination - grossly normal Gait - spastic, especially in left leg Balance - not tested Cranial Nerves: I: smell Not tested  II: visual acuity  OS: nl    OD: nl  II: visual fields Full to confrontation  II: pupils Equal, round, reactive to light  III,VII: ptosis None  III,IV,VI: extraocular muscles  Full ROM  V: mastication Normal  V: facial light touch sensation  Normal  V,VII: corneal reflex  Present  VII: facial muscle function - upper  Normal  VII: facial muscle function - lower Normal  VIII: hearing Not tested  IX: soft palate elevation  Normal  IX,X: gag reflex Present  XI: trapezius strength  5/5  XI: sternocleidomastoid strength 5/5  XI: neck flexion strength  5/5  XII: tongue strength  Normal    Data Review Lab Results  Component Value Date   WBC 12.7* 08/11/2015   HGB 17.1* 08/11/2015   HCT 47.4 08/11/2015   MCV 94.4 08/11/2015   PLT 218 08/11/2015   Lab Results  Component Value Date   NA 139 08/11/2015   K 4.0 08/11/2015   CL 103 08/11/2015   CO2 25 08/11/2015   BUN 25* 08/11/2015   CREATININE 0.78 08/11/2015   GLUCOSE 112* 08/11/2015   No results found for: INR, PROTIME  Assessment/Plan: Patient admitted for thoracic laminectomy T11 and T12 for spinal cord decompression. Patient has failed a reasonable attempt at conservative therapy.  I explained the condition and procedure to the patient and answered any questions.  Patient wishes to proceed with procedure as planned. Understands risks/ benefits and typical outcomes of procedure.   [place],Kimon Loewen S 08/11/2015 1:09 PM

## 2015-08-12 DIAGNOSIS — M4804 Spinal stenosis, thoracic region: Secondary | ICD-10-CM | POA: Diagnosis not present

## 2015-08-12 MED ORDER — TRAMADOL HCL 50 MG PO TABS: 50.0000 mg | ORAL_TABLET | Freq: Four times a day (QID) | ORAL | Status: DC | PRN

## 2015-08-12 NOTE — Care Management Note (Signed)
Case Management Note  Patient Details  Name: Russell Gonzales MRN: [id number] Date of Birth: 1950/10/25  Subjective/Objective:                    Action/Plan: Patient discharging home today with [facility] services. CM met with the patient and provided him the choices of [place] with [employer]. He selected [place]. Tiffany with [facility] notified and accepted the referral. Jermaine with [facility] notified of the order for the rolling walker and will deliver the equipment to the room. Patient did not want the 3 in 1. Bedside RN updated.   Expected Discharge Date:                  Expected Discharge Plan:  [place]  In-House Referral:     Discharge planning Services  CM Consult  Post Acute Care Choice:  Durable Medical Equipment, Home Health Choice offered to:  Patient  DME Arranged:  Walker rolling DME Agency:  [place]:  PT [facility] Agency:  [place]  Status of Service:  Completed, signed off  Medicare Important Message Given:    Date Medicare IM Given:    Medicare IM give by:    Date Additional Medicare IM Given:    Additional Medicare Important Message give by:     If discussed at [address] of Stay Meetings, dates discussed:    Additional Comments:  Pollie Friar, RN 08/12/2015, 11:00 AM

## 2015-08-12 NOTE — Anesthesia Postprocedure Evaluation (Signed)
Anesthesia Post Note  Patient: Russell Gonzales  Procedure(s) Performed: Procedure(s) (LRB): Laminectomy - Thoracic eleven - Thoracic tweleve (N/A)  Patient location during evaluation: PACU Anesthesia Type: General Level of consciousness: awake and alert and patient cooperative Pain management: pain level controlled Vital Signs Assessment: post-procedure vital signs reviewed and stable Respiratory status: spontaneous breathing and respiratory function stable Cardiovascular status: stable Anesthetic complications: no    Last Vitals:  Filed Vitals:   08/12/15 0609 08/12/15 0955  BP: 129/73 130/64  Pulse: 67 91  Temp: 36.3 C 36.8 C  Resp: 18 18    Last Pain:  Filed Vitals:   08/12/15 0956  PainSc: [name]

## 2015-08-12 NOTE — Evaluation (Signed)
Occupational Therapy Evaluation Patient Details Name: Russell Gonzales MRN: [id number] DOB: February 26, 1951 Today's Date: 08/12/2015    History of Present Illness Patient is a 65 y.o. male admitted for leg weakness, progressive, with gait instability. Onset of symptoms was several weeks ago, rapidly worsening since that time. The pain is rated severe, and is located at the back and radiates to anterior thighs. 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  PMH positive for GERD, cervical fusion, L2-S1 fusion, HTN, HLD, L ankle fx/fusion.    Clinical Impression   Pt reports he was managing ADLs independently PTA. Currently pt is overall min guard-min assist for ADLs and functional mobility. Began ADL, back, and safety education with pt; he verbalized understanding. Pt planning to d/c home with intermittent supervision from his wife. Pt would benefit from continued skilled OT to address established goals.    Follow Up Recommendations  No OT follow up;Supervision - Intermittent    Equipment Recommendations  Tub/shower seat    Recommendations for Other Services       Precautions / Restrictions Precautions Precautions: Fall;Back Precaution Booklet Issued: No Precaution Comments: Reviewed precautions with pt. Restrictions Weight Bearing Restrictions: No      Mobility Bed Mobility Overal bed mobility: Needs Assistance Bed Mobility: Rolling;Sidelying to Sit;Sit to Sidelying Rolling: Supervision Sidelying to sit: Supervision     Sit to sidelying: Supervision General bed mobility comments: VCs for technique. Use of bed rail with HOB flat.  Transfers Overall transfer level: Needs assistance Equipment used: Rolling walker (2 wheeled) Transfers: Sit to/from Stand Sit to Stand: Min guard         General transfer comment: VCs for hand placement.    Balance Overall balance assessment: Needs assistance Sitting-balance support: Feet supported;No upper extremity supported Sitting  balance-Leahy Scale: Good     Standing balance support: No upper extremity supported;During functional activity Standing balance-Leahy Scale: Fair Standing balance comment: able to stand and pee and wash hands without UE support                            ADL Overall ADL's : Needs assistance/impaired Eating/Feeding: Set up;Sitting   Grooming: Min guard;Standing   Upper Body Bathing: Supervision/ safety;Sitting   Lower Body Bathing: Min guard;Sit to/from stand   Upper Body Dressing : Supervision/safety;Sitting Upper Body Dressing Details (indicate cue type and reason): to doff hospital gown Lower Body Dressing: Minimal assistance;Sit to/from stand Lower Body Dressing Details (indicate cue type and reason): Educated pt on compensatory strategies for LB ADLs. Pt reports wife can assist with dressing as needed. Toilet Transfer: Min guard;Ambulation;Regular Toilet;RW Toilet Transfer Details (indicate cue type and reason): Pt able to demo safe toilet transfer to regular height toilet with no use of grab bars. Toileting- Water quality scientist and Hygiene: Min guard;Sit to/from stand   Tub/ Shower Transfer: Min guard;Ambulation;Shower seat;Rolling walker;Grab Paediatric nurse Details (indicate cue type and reason): Educated pt on tub transfer technique. Pt with difficulty bringing L leg up high enough to get over side of tub. Educated pt on need for supervision for safety with tub transfers and use of shower chair; pt verbalized understanding. Functional mobility during ADLs: Min guard;Rolling walker General ADL Comments: No family present for OT eval. Educated pt on home safety, need for supervision with tub transfer and bathing, placing frequently used items at countertop height; pt verbalized understanding. Pt reports he notices increased fatigue from PT session earlier this AM.  Vision Vision Assessment?: No apparent visual deficits   Perception     Praxis       Pertinent Vitals/Pain Pain Assessment: 0-10 Pain Score: 4  Pain Location: back, surgical site Pain Descriptors / Indicators: Aching;Operative site guarding Pain Intervention(s): Limited activity within patient's tolerance;Monitored during session;Repositioned     Hand Dominance     Extremity/Trunk Assessment Upper Extremity Assessment Upper Extremity Assessment: Overall WFL for tasks assessed   Lower Extremity Assessment Lower Extremity Assessment: Defer to PT evaluation RLE Deficits / Details: AROM WFL, strength hip flexion 3/5, knee extension 4-/5, ankle DF 4/5 RLE Sensation: decreased light touch LLE Deficits / Details: AROM WFL except limited ankle DF due to previous fusion, strength hip flexion 3/5, knee extension 4-/5, ankle DF 4-/5 LLE Sensation: decreased light touch   Cervical / Trunk Assessment Cervical / Trunk Assessment: Lordotic;Other exceptions Cervical / Trunk Exceptions: stays flexed mainly at hips, but also some kyphoscoliosis in mid thoracic spine; encouraged back precautions with ambulation (not upright due to arching back when trying to stand erect due to flexed hips, but neutral spine)   Communication Communication Communication: No difficulties   Cognition Arousal/Alertness: Awake/alert Behavior During Therapy: WFL for tasks assessed/performed Overall Cognitive Status: Within Functional Limits for tasks assessed                     General Comments       Exercises       Shoulder Instructions      Home Living Family/patient expects to be discharged to:: Private residence Living Arrangements: Spouse/significant other Available Help at Discharge: Family;Available PRN/intermittently Type of Home: Mobile home Home Access: Stairs to enter Entrance Stairs-Number of Steps: 3 Entrance Stairs-Rails: Left Home Layout: One level     Bathroom Shower/Tub: Tub/shower unit Shower/tub characteristics: Curtain Biochemist, clinical: Handicapped  height Bathroom Accessibility: No   Home Equipment: Environmental consultant - 4 wheels;Cane - single point;Grab bars - tub/shower (used a chair in the shower)          Prior Functioning/Environment Level of Independence: Independent with assistive device(s)        Comments: increased difficutly ambulating just prior to admission with dragging L leg    OT Diagnosis: Generalized weakness;Acute pain   OT Problem List: Decreased strength;Decreased activity tolerance;Impaired balance (sitting and/or standing);Decreased safety awareness;Decreased knowledge of use of DME or AE;Decreased knowledge of precautions;Pain   OT Treatment/Interventions: Self-care/ADL training;Energy conservation;DME and/or AE instruction;Therapeutic activities;Patient/family education;Balance training    OT Goals(Current goals can be found in the care plan section) Acute Rehab OT Goals Patient Stated Goal: go home and be independent OT Goal Formulation: With patient Time For Goal Achievement: 08/26/15 Potential to Achieve Goals: Good ADL Goals Pt Will Perform Grooming: with modified independence;standing Pt Will Perform Upper Body Bathing: with modified independence;sitting Pt Will Perform Lower Body Bathing: with modified independence;sit to/from stand Pt Will Transfer to Toilet: with modified independence;ambulating Pt Will Perform Toileting - Clothing Manipulation and hygiene: with modified independence;sit to/from stand Pt Will Perform Tub/Shower Transfer: Tub transfer;with modified independence;shower seat;ambulating;rolling walker  OT Frequency: Min 2X/week   Barriers to D/C: Decreased caregiver support  wife works during the day       Co-evaluation              End of Session Equipment Utilized During Treatment: Gait belt;Rolling walker  Activity Tolerance: Patient tolerated treatment well Patient left: in bed;with call bell/phone within reach;Other (comment) (CM present)   Time: [id number] OT Time  Calculation (  min): 20 min Charges:  OT General Charges $OT Visit: 1 Procedure OT Evaluation $OT Eval Moderate Complexity: 1 Procedure G-Codes: OT G-codes **NOT FOR INPATIENT CLASS** Functional Assessment Tool Used: Clinical judgement Functional Limitation: Self care Self Care Current Status [id number]): At least 1 percent but less than 20 percent impaired, limited or restricted Self Care Goal Status [id number]): At least 1 percent but less than 20 percent impaired, limited or restricted   Binnie Kand M.S., OTR/L Pager: [phone]  08/12/2015, 10:44 AM

## 2015-08-12 NOTE — Progress Notes (Signed)
Discharge orders received, Pt for discharge home today with home health PT . IV d/c'd. D/c instructions and RX given with verbalized understanding. Family at bedside to assist patient with discharge. Staff bought pt downstairs via wheelchair.08/12/15 1335

## 2015-08-12 NOTE — Discharge Summary (Signed)
Physician Discharge Summary  Patient ID: Russell Gonzales MRN: [id number] DOB/AGE: [birth year] 64 y.o.  Admit date: 08/11/2015 Discharge date: 08/12/2015  Admission Diagnoses: thoracic myelopathy   Discharge Diagnoses: same   Discharged Condition: good  Hospital Course: The patient was admitted on 08/11/2015 and taken to the operating room where the patient underwent thoracic laminectomy. The patient tolerated the procedure well and was taken to the recovery room and then to the floor in stable condition. The hospital course was routine. There were no complications. The wound remained clean dry and intact. Pt had appropriate back soreness. No complaints of leg pain or new N/T/W. He was walking better than pre-op. The patient remained afebrile with stable vital signs, and tolerated a regular diet. The patient continued to increase activities, and pain was well controlled with oral pain medications.   Consults: None  Significant Diagnostic Studies:  Results for orders placed or performed during the hospital encounter of 08/11/15  Surgical pcr screen  Result Value Ref Range   MRSA, PCR NEGATIVE NEGATIVE   Staphylococcus aureus POSITIVE (A) NEGATIVE  Basic metabolic panel  Result Value Ref Range   Sodium 139 135 - 145 mmol/L   Potassium 4.0 3.5 - 5.1 mmol/L   Chloride 103 101 - 111 mmol/L   CO2 25 22 - 32 mmol/L   Glucose, Bld 112 (H) 65 - 99 mg/dL   BUN 25 (H) 6 - 20 mg/dL   Creatinine, Ser 0.78 0.61 - 1.24 mg/dL   Calcium 9.1 8.9 - 10.3 mg/dL   GFR calc non Af Amer >60 >60 mL/min   GFR calc Af Amer >60 >60 mL/min   Anion gap 11 5 - 15  CBC  Result Value Ref Range   WBC 12.7 (H) 4.0 - 10.5 K/uL   RBC 5.02 4.22 - 5.81 MIL/uL   Hemoglobin 17.1 (H) 13.0 - 17.0 g/dL   HCT 47.4 39.0 - 52.0 %   MCV 94.4 78.0 - 100.0 fL   MCH 34.1 (H) 26.0 - 34.0 pg   MCHC 36.1 (H) 30.0 - 36.0 g/dL   RDW 12.8 11.5 - 15.5 %   Platelets 218 150 - 400 K/uL    Chest 2 View  08/11/2015  CLINICAL DATA:   Hypertension.  Myelopathy. EXAM: CHEST  2 VIEW COMPARISON:  March 06, 2012 FINDINGS: There is no edema or consolidation. Heart size and pulmonary vascularity are normal. No adenopathy is apparent. Mild prominence in the right hilar region is stable and felt to be due to accentuation in this area due to the adjacent scoliosis and lordosis. There is mid thoracic dextroscoliosis with thoracolumbar levoscoliosis. There is postoperative change in the visualized upper lumbar region. There is also postoperative change in the lower cervical region. There is thoracic lordosis. IMPRESSION: No edema or consolidation. Scoliosis with thoracic lordosis. No change in cardiac silhouette and right hilar region appearance. No adenopathy is felt to be present on this study. Electronically Signed   By: Lowella Grip III M.D.   On: 08/11/2015 11:00   Dg Thoracolumabar Spine  08/11/2015  CLINICAL DATA:  Thoracic laminectomy EXAM: THORACOLUMBAR SPINE 1V COMPARISON:  None. FINDINGS: A needle is noted in the posterior soft tissues at the T11-T12 level. Orthopedic hardware is noted throughout the lumbar spine. A compression deformity of L3 is noted. Subsequent film show surgical instruments posterior to the T11-T12 interspace IMPRESSION: Intraoperative localization for laminectomy at T11-12 Electronically Signed   By: Inez Catalina M.D.   On: 08/11/2015 15:44  Mr Cervical Spine Wo Contrast  07/23/2015  CLINICAL DATA:  Worsening back pain with upper leg weakness. Evaluate for spinal stenosis or nerve root compression or nerve root compression. No fever or infectious symptoms. EXAM: MRI CERVICAL, THORACIC AND LUMBAR SPINE WITHOUT CONTRAST TECHNIQUE: Multiplanar and multiecho pulse sequences of the cervical, thoracic and lumbar spine were obtained without intravenous contrast. COMPARISON:  Total spine myelogram 05/27/2015. FINDINGS: MR CERVICAL SPINE FINDINGS Patient is status post C5-6 ACDF. The alignment is stable with mild  reversal of the usual cervical lordosis. There is no focal angulation or listhesis. There is no evidence of acute fracture. The spinal canal is adequately patent at all levels. The cervical cord is normal in signal and caliber. There is stable mild facet hypertrophy without significant resulting foraminal compromise. MR THORACIC SPINE FINDINGS This portion of the study is limited by motion and a moderate convex right scoliosis. The lateral alignment is normal. There is no evidence of acute fracture or worrisome osseous lesion. The proximal thoracic cord appears normal in signal and caliber. There are no significant paraspinal abnormalities. There is a probable epidermal inclusion cyst in the subcutaneous fat posteriorly in the mid thoracic region. No significant disc space findings are present within the upper thoracic spine. There is asymmetric left-sided facet hypertrophy at T8-9 and T9-10, contributing to some foraminal narrowing, but no cord deformity. T10-11: There is advanced asymmetric facet hypertrophy on the left with bony overgrowth and an effusion. There is moderate left foraminal narrowing. There is also mild mass effect on the cord. T11-12: There is advanced asymmetric facet hypertrophy on the left with a superimposed left foraminal disc osteophyte complex, causing left foraminal narrowing and probable left T11 nerve root encroachment. The CSF surrounding the cord is effaced with mild cord flattening, similar to previous myelogram. There is possible mild cord hyperintensity at this disc space level, best seen on image 51 of series 25. T12-L1: Bilateral facet hypertrophy with asymmetric disc bulging on the left contributing to mild left foraminal narrowing. No cord deformity. MR LUMBAR SPINE FINDINGS There is a convex left lumbar scoliosis status post pedicle screw and rod fusion from L2 through S2. S1 is transitional and nearly fully lumbarized. The hardware appears unchanged. Chronic deformity of the  L4 vertebral body appears unchanged. There is no evidence of fracture or acute paraspinal abnormality. L1-2: Disc height is maintained. Stable bilateral facet hypertrophy. No significant spinal stenosis or nerve root encroachment. L2-3: Status post posterolateral fusion. No spinal stenosis or nerve root encroachment. L3-4: Status post laminectomy and PLIF. No significant spinal stenosis or nerve root encroachment. L4-5: The spinal canal appears adequately decompressed by the previous laminectomy. Posterior osteophytes contribute to mild residual foraminal narrowing which appears unchanged. L5-S1: The spinal canal and neural foramina are adequately decompressed by the previous laminectomies. Mild residual posterior spurring. S1-2:  Stable postsurgical changes.  No significant spinal stenosis. IMPRESSION: 1. No definite acute findings are identified within the spine status post C5-6 ACDF and lumbar fusion from L2 through S2. 2. No significant spinal stenosis within the cervical or upper thoracic spine. 3. The CSF surrounding the cord is chronically effaced at T11-12 with cord flattening, similar to previous myelogram. There is possible mild cord hyperintensity at this level which could reflect myelomalacia. 4. Stable findings in the lumbar spine with multilevel spondylosis, but no recurrent high-grade spinal stenosis or foraminal compromise. Electronically Signed   By: Richardean Sale M.D.   On: 07/23/2015 17:37   Mr Thoracic Spine Wo Contrast  07/23/2015  CLINICAL DATA:  Worsening back pain with upper leg weakness. Evaluate for spinal stenosis or nerve root compression or nerve root compression. No fever or infectious symptoms. EXAM: MRI CERVICAL, THORACIC AND LUMBAR SPINE WITHOUT CONTRAST TECHNIQUE: Multiplanar and multiecho pulse sequences of the cervical, thoracic and lumbar spine were obtained without intravenous contrast. COMPARISON:  Total spine myelogram 05/27/2015. FINDINGS: MR CERVICAL SPINE FINDINGS  Patient is status post C5-6 ACDF. The alignment is stable with mild reversal of the usual cervical lordosis. There is no focal angulation or listhesis. There is no evidence of acute fracture. The spinal canal is adequately patent at all levels. The cervical cord is normal in signal and caliber. There is stable mild facet hypertrophy without significant resulting foraminal compromise. MR THORACIC SPINE FINDINGS This portion of the study is limited by motion and a moderate convex right scoliosis. The lateral alignment is normal. There is no evidence of acute fracture or worrisome osseous lesion. The proximal thoracic cord appears normal in signal and caliber. There are no significant paraspinal abnormalities. There is a probable epidermal inclusion cyst in the subcutaneous fat posteriorly in the mid thoracic region. No significant disc space findings are present within the upper thoracic spine. There is asymmetric left-sided facet hypertrophy at T8-9 and T9-10, contributing to some foraminal narrowing, but no cord deformity. T10-11: There is advanced asymmetric facet hypertrophy on the left with bony overgrowth and an effusion. There is moderate left foraminal narrowing. There is also mild mass effect on the cord. T11-12: There is advanced asymmetric facet hypertrophy on the left with a superimposed left foraminal disc osteophyte complex, causing left foraminal narrowing and probable left T11 nerve root encroachment. The CSF surrounding the cord is effaced with mild cord flattening, similar to previous myelogram. There is possible mild cord hyperintensity at this disc space level, best seen on image 51 of series 25. T12-L1: Bilateral facet hypertrophy with asymmetric disc bulging on the left contributing to mild left foraminal narrowing. No cord deformity. MR LUMBAR SPINE FINDINGS There is a convex left lumbar scoliosis status post pedicle screw and rod fusion from L2 through S2. S1 is transitional and nearly fully  lumbarized. The hardware appears unchanged. Chronic deformity of the L4 vertebral body appears unchanged. There is no evidence of fracture or acute paraspinal abnormality. L1-2: Disc height is maintained. Stable bilateral facet hypertrophy. No significant spinal stenosis or nerve root encroachment. L2-3: Status post posterolateral fusion. No spinal stenosis or nerve root encroachment. L3-4: Status post laminectomy and PLIF. No significant spinal stenosis or nerve root encroachment. L4-5: The spinal canal appears adequately decompressed by the previous laminectomy. Posterior osteophytes contribute to mild residual foraminal narrowing which appears unchanged. L5-S1: The spinal canal and neural foramina are adequately decompressed by the previous laminectomies. Mild residual posterior spurring. S1-2:  Stable postsurgical changes.  No significant spinal stenosis. IMPRESSION: 1. No definite acute findings are identified within the spine status post C5-6 ACDF and lumbar fusion from L2 through S2. 2. No significant spinal stenosis within the cervical or upper thoracic spine. 3. The CSF surrounding the cord is chronically effaced at T11-12 with cord flattening, similar to previous myelogram. There is possible mild cord hyperintensity at this level which could reflect myelomalacia. 4. Stable findings in the lumbar spine with multilevel spondylosis, but no recurrent high-grade spinal stenosis or foraminal compromise. Electronically Signed   By: Richardean Sale M.D.   On: 07/23/2015 17:37   Mr Lumbar Spine Wo Contrast  07/23/2015  CLINICAL DATA:  Worsening  back pain with upper leg weakness. Evaluate for spinal stenosis or nerve root compression or nerve root compression. No fever or infectious symptoms. EXAM: MRI CERVICAL, THORACIC AND LUMBAR SPINE WITHOUT CONTRAST TECHNIQUE: Multiplanar and multiecho pulse sequences of the cervical, thoracic and lumbar spine were obtained without intravenous contrast. COMPARISON:  Total  spine myelogram 05/27/2015. FINDINGS: MR CERVICAL SPINE FINDINGS Patient is status post C5-6 ACDF. The alignment is stable with mild reversal of the usual cervical lordosis. There is no focal angulation or listhesis. There is no evidence of acute fracture. The spinal canal is adequately patent at all levels. The cervical cord is normal in signal and caliber. There is stable mild facet hypertrophy without significant resulting foraminal compromise. MR THORACIC SPINE FINDINGS This portion of the study is limited by motion and a moderate convex right scoliosis. The lateral alignment is normal. There is no evidence of acute fracture or worrisome osseous lesion. The proximal thoracic cord appears normal in signal and caliber. There are no significant paraspinal abnormalities. There is a probable epidermal inclusion cyst in the subcutaneous fat posteriorly in the mid thoracic region. No significant disc space findings are present within the upper thoracic spine. There is asymmetric left-sided facet hypertrophy at T8-9 and T9-10, contributing to some foraminal narrowing, but no cord deformity. T10-11: There is advanced asymmetric facet hypertrophy on the left with bony overgrowth and an effusion. There is moderate left foraminal narrowing. There is also mild mass effect on the cord. T11-12: There is advanced asymmetric facet hypertrophy on the left with a superimposed left foraminal disc osteophyte complex, causing left foraminal narrowing and probable left T11 nerve root encroachment. The CSF surrounding the cord is effaced with mild cord flattening, similar to previous myelogram. There is possible mild cord hyperintensity at this disc space level, best seen on image 51 of series 25. T12-L1: Bilateral facet hypertrophy with asymmetric disc bulging on the left contributing to mild left foraminal narrowing. No cord deformity. MR LUMBAR SPINE FINDINGS There is a convex left lumbar scoliosis status post pedicle screw and rod  fusion from L2 through S2. S1 is transitional and nearly fully lumbarized. The hardware appears unchanged. Chronic deformity of the L4 vertebral body appears unchanged. There is no evidence of fracture or acute paraspinal abnormality. L1-2: Disc height is maintained. Stable bilateral facet hypertrophy. No significant spinal stenosis or nerve root encroachment. L2-3: Status post posterolateral fusion. No spinal stenosis or nerve root encroachment. L3-4: Status post laminectomy and PLIF. No significant spinal stenosis or nerve root encroachment. L4-5: The spinal canal appears adequately decompressed by the previous laminectomy. Posterior osteophytes contribute to mild residual foraminal narrowing which appears unchanged. L5-S1: The spinal canal and neural foramina are adequately decompressed by the previous laminectomies. Mild residual posterior spurring. S1-2:  Stable postsurgical changes.  No significant spinal stenosis. IMPRESSION: 1. No definite acute findings are identified within the spine status post C5-6 ACDF and lumbar fusion from L2 through S2. 2. No significant spinal stenosis within the cervical or upper thoracic spine. 3. The CSF surrounding the cord is chronically effaced at T11-12 with cord flattening, similar to previous myelogram. There is possible mild cord hyperintensity at this level which could reflect myelomalacia. 4. Stable findings in the lumbar spine with multilevel spondylosis, but no recurrent high-grade spinal stenosis or foraminal compromise. Electronically Signed   By: Richardean Sale M.D.   On: 07/23/2015 17:37    Antibiotics:  Anti-infectives    Start     Dose/Rate Route Frequency Ordered Stop  08/11/15 2200  ceFAZolin (ANCEF) IVPB 1 g/50 mL premix     1 g 100 mL/hr over 30 Minutes Intravenous Every 8 hours 08/11/15 1730 08/12/15 0552   08/11/15 1415  bacitracin 50,000 Units in sodium chloride irrigation 0.9 % 500 mL irrigation  Status:  Discontinued       As needed 08/11/15  1416 08/11/15 1521   08/11/15 1215  ceFAZolin (ANCEF) IVPB 2 g/50 mL premix     2 g 100 mL/hr over 30 Minutes Intravenous On call to O.R. 08/11/15 1041 08/11/15 1354      Discharge Exam: Blood pressure 129/73, pulse 67, temperature 97.4 F (36.3 C), temperature source Oral, resp. rate 18, height 5' 5.5" (1.664 m), weight 110.224 kg (243 lb), SpO2 90 %. Neurologic: Motor: Improved: mld weakness L hip flexor 4/5 Reflexes: 2+ and symmetric increased LLE Gait: Abnormal incision ok  Discharge Medications:     Medication List    TAKE these medications        atorvastatin 20 MG tablet  Commonly known as:  LIPITOR  TAKE ONE TABLET BY MOUTH ONCE DAILY.     fish oil-omega-3 fatty acids 1000 MG capsule  Take 1 g by mouth 2 (two) times daily.     hydrochlorothiazide 12.5 MG capsule  Commonly known as:  MICROZIDE  Take 1 capsule (12.5 mg total) by mouth daily.     ibuprofen 400 MG tablet  Commonly known as:  ADVIL,MOTRIN  Take 400 mg by mouth 2 (two) times daily as needed for mild pain.     Iron 325 (65 Fe) MG Tabs  Take 325 mg by mouth daily.     ketoconazole 2 % cream  Commonly known as:  NIZORAL  Apply 1 application topically daily as needed for irritation. Reported on 07/01/2015     lisinopril 40 MG tablet  Commonly known as:  PRINIVIL,ZESTRIL  Take 1 tablet daily     MULTIVITAMIN ADULT PO  Take 1 tablet by mouth daily.     predniSONE 10 MG (21) Tbpk tablet  Commonly known as:  STERAPRED UNI-PAK 21 TAB  Take 10 mg by mouth daily.     traMADol 50 MG tablet  Commonly known as:  ULTRAM  Take 1 tablet (50 mg total) by mouth every 6 (six) hours as needed for moderate pain.     TURMERIC PO  Take 1,000 mg by mouth daily.        Disposition: home with [facility] PT   Final Dx: thoracic laminectomy      Discharge Instructions    Call MD for:  difficulty breathing, headache or visual disturbances    Complete by:  As directed      Call MD for:  persistant nausea and  vomiting    Complete by:  As directed      Call MD for:  redness, tenderness, or signs of infection (pain, swelling, redness, odor or green/yellow discharge around incision site)    Complete by:  As directed      Call MD for:  severe uncontrolled pain    Complete by:  As directed      Call MD for:  temperature >100.4    Complete by:  As directed      Diet - low sodium heart healthy    Complete by:  As directed      Discharge instructions    Complete by:  As directed   No strenuous activity, no heavy lfting     Increase activity  slowly    Complete by:  As directed      No wound care    Complete by:  As directed            Follow-up Information    Follow up with Eustace Moore, MD In 2 weeks.   Specialty:  Neurosurgery   Contact information:   [address] [place] [address] [phone]        Signed: EFFIE, KOCHANSKI 08/12/2015, 9:45 AM

## 2015-08-12 NOTE — Evaluation (Signed)
Physical Therapy Evaluation Patient Details Name: Russell Gonzales MRN: [id number] DOB: 01/27/1951 Today's Date: 08/12/2015   History of Present Illness  Patient is a 65 y.o. male admitted for leg weakness, progressive, with gait instability. Onset of symptoms was several weeks ago, rapidly worsening since that time. The pain is rated severe, and is located at the back and radiates to anterior thighs. 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  PMH positive for GERD, cervical fusion, L2-S1 fusion, HTN, HLD, L ankle fx/fusion.   Clinical Impression  Patient presents with decreased mobility due to deficits listed in PT problem list.  He demonstrates LE weakness and decreased sensation which places him at risk to fall, but has improved strength since surgery and uses walker to decrease fall risk.  Feel safe for d/c home today with intermittent support from wife.  Will need HHPT at d/c and recommend transition to outpatient PT when cleared by surgeon.      Follow Up Recommendations Home health PT    Equipment Recommendations  Rolling walker with 5" wheels    Recommendations for Other Services       Precautions / Restrictions Precautions Precautions: Fall;Back Precaution Booklet Issued: No      Mobility  Bed Mobility Overal bed mobility: Needs Assistance Bed Mobility: Sit to Sidelying         Sit to sidelying: Supervision General bed mobility comments: cues for technique  Transfers Overall transfer level: Needs assistance Equipment used: Rolling walker (2 wheeled) Transfers: Sit to/from Stand Sit to Stand: Supervision         General transfer comment: cues for hand placement  Ambulation/Gait Ambulation/Gait assistance: Supervision;Min guard Ambulation Distance (Feet): 220 Feet Assistive device: Rolling walker (2 wheeled) Gait Pattern/deviations: Step-through pattern;Decreased stride length;Decreased dorsiflexion - left;Trunk flexed     General Gait Details:  demonstrated safety with turns with improved proximity to walker due to pt staying flexed and getting farther away with risk for dropping due to knees buckling; initially cues for upright posture with walker adjusted for height, but then allowed flexion due to arching back with attempt to straighten up  Stairs Stairs: Yes Stairs assistance: Min guard Stair Management: One rail Left;Alternating pattern;Step to pattern;Forwards Number of Stairs: 9 (3 steps x 3 reps) General stair comments: cues for sequence esp to lower with step to pattern for safety  Wheelchair Mobility    Modified Rankin (Stroke Patients Only)       Balance Overall balance assessment: Needs assistance         Standing balance support: No upper extremity supported Standing balance-Leahy Scale: Fair Standing balance comment: able to stand and pee and wash hands without UE support                             Pertinent Vitals/Pain Pain Assessment: 0-10 Pain Score: 4  Pain Location: Back at surgical site Pain Descriptors / Indicators: Sore;Tightness Pain Intervention(s): Monitored during session;Repositioned    Home Living Family/patient expects to be discharged to:: Private residence   Available Help at Discharge: Family;Available PRN/intermittently Type of Home: Mobile home Home Access: Stairs to enter Entrance Stairs-Rails: Left Entrance Stairs-Number of Steps: 3 Home Layout: One level Home Equipment: Walker - 4 wheels;Cane - single point      Prior Function Level of Independence: Independent with assistive device(s)         Comments: increased difficutly ambulating just prior to admission with dragging L leg  Hand Dominance        Extremity/Trunk Assessment   Upper Extremity Assessment: Overall WFL for tasks assessed           Lower Extremity Assessment: RLE deficits/detail;LLE deficits/detail RLE Deficits / Details: AROM WFL, strength hip flexion 3/5, knee extension  4-/5, ankle DF 4/5 LLE Deficits / Details: AROM WFL except limited ankle DF due to previous fusion, strength hip flexion 3/5, knee extension 4-/5, ankle DF 4-/5  Cervical / Trunk Assessment: Lordotic;Other exceptions  Communication   Communication: No difficulties  Cognition Arousal/Alertness: Awake/alert Behavior During Therapy: WFL for tasks assessed/performed Overall Cognitive Status: Within Functional Limits for tasks assessed                      General Comments      Exercises        Assessment/Plan    PT Assessment All further PT needs can be met in the next venue of care  PT Diagnosis Generalized weakness;Abnormality of gait   PT Problem List Decreased strength;Decreased activity tolerance;Decreased knowledge of use of DME;Decreased safety awareness;Pain;Decreased knowledge of precautions;Decreased balance;Decreased mobility  PT Treatment Interventions     PT Goals (Current goals can be found in the Care Plan section) Acute Rehab PT Goals PT Goal Formulation: All assessment and education complete, DC therapy    Frequency     Barriers to discharge        Co-evaluation               End of Session Equipment Utilized During Treatment: Gait belt Activity Tolerance: Patient tolerated treatment well Patient left: in bed;with call bell/phone within reach      Functional Assessment Tool Used: Clinical Judgement Functional Limitation: Mobility: Walking and moving around Mobility: Walking and Moving Around Current Status (C9449): At least 20 percent but less than 40 percent impaired, limited or restricted Mobility: Walking and Moving Around Goal Status [phone]): At least 20 percent but less than 40 percent impaired, limited or restricted Mobility: Walking and Moving Around Discharge Status [phone]): At least 20 percent but less than 40 percent impaired, limited or restricted    Time: 0920-0950 PT Time Calculation (min) (ACUTE ONLY): 30 min   Charges:    PT Evaluation $PT Eval Moderate Complexity: 1 Procedure PT Treatments $Gait Training: 8-22 mins   PT G Codes:   PT G-Codes **NOT FOR INPATIENT CLASS** Functional Assessment Tool Used: Clinical Judgement Functional Limitation: Mobility: Walking and moving around Mobility: Walking and Moving Around Current Status (K5993): At least 20 percent but less than 40 percent impaired, limited or restricted Mobility: Walking and Moving Around Goal Status [phone]): At least 20 percent but less than 40 percent impaired, limited or restricted Mobility: Walking and Moving Around Discharge Status [phone]): At least 20 percent but less than 40 percent impaired, limited or restricted    Russell Gonzales 08/12/2015, 10:23 AM  Magda Kiel, [place] 08/12/2015

## 2015-08-13 ENCOUNTER — Encounter (HOSPITAL_COMMUNITY): Payer: Self-pay | Admitting: Neurological Surgery

## 2015-09-01 ENCOUNTER — Telehealth: Payer: Self-pay | Admitting: Neurology

## 2015-09-01 NOTE — Telephone Encounter (Signed)
Please advise 

## 2015-09-01 NOTE — Telephone Encounter (Signed)
Pt needs to know if he is to keep this appt coming up to see dr patel please call [phone]

## 2015-09-01 NOTE — Telephone Encounter (Signed)
Appointment cancelled per Dr. Posey Pronto.  Patient notified.

## 2015-09-01 NOTE — Telephone Encounter (Signed)
No need to follow-up with me unless any new neurological issues.  He will be seeing neurosurgery in follow-up.

## 2015-09-06 ENCOUNTER — Ambulatory Visit: Payer: PPO | Admitting: Neurology

## 2015-09-13 ENCOUNTER — Other Ambulatory Visit: Payer: Self-pay | Admitting: Emergency Medicine

## 2015-10-06 ENCOUNTER — Encounter: Payer: Self-pay | Admitting: Physical Therapy

## 2015-10-06 ENCOUNTER — Ambulatory Visit: Payer: PPO | Attending: Neurological Surgery | Admitting: Physical Therapy

## 2015-10-06 DIAGNOSIS — M545 Low back pain, unspecified: Secondary | ICD-10-CM

## 2015-10-06 DIAGNOSIS — R262 Difficulty in walking, not elsewhere classified: Secondary | ICD-10-CM | POA: Diagnosis present

## 2015-10-06 NOTE — Therapy (Signed)
[address] [place], [place], [address] Phone: [phone]   Fax:  [phone]  Physical Therapy Evaluation  Patient Details  Name: Russell Gonzales MRN: [id number] Date of Birth: 03/16/1951 Referring Provider: Sherley Bounds  Encounter Date: 10/06/2015      PT End of Session - 10/06/15 1124    Visit Number 1   Date for PT Re-Evaluation 12/06/15   PT Start Time 1055   PT Stop Time 1150   PT Time Calculation (min) 55 min   Activity Tolerance Patient tolerated treatment well   Behavior During Therapy [facility] for tasks assessed/performed      Past Medical History  Diagnosis Date  . Seasonal allergies   . Arthritis     "severe in my back" (08/11/2015)  . GERD (gastroesophageal reflux disease)   . Hyperlipidemia   . Hypertension   . Shortness of breath dyspnea     With exertion  . Hemorrhoids   . Chronic lower back pain     Past Surgical History  Procedure Laterality Date  . Anterior cervical decomp/discectomy fusion  05/2008  . Back surgery    . Colonoscopy w/ biopsies and polypectomy    . Polypectomy  2016  . Prostate biopsy  2012  . Ankle fracture surgery Left 1972    "it was crushed; has screws and plates"  . Fracture surgery    . Knee arthroscopy Left 1970s    "removed cyst"  . Posterior lumbar fusion  04/2008    "L1-S1; have rods and screws between 4 vertebrae"  . Thoracic laminectomy  08/11/2015    Decompressive thoracic laminectomy, medial facetectomy and foraminotomy T11 and T12  . Lumbar laminectomy/decompression microdiscectomy N/A 08/11/2015    Procedure: Laminectomy - Thoracic eleven - Thoracic tweleve;  Surgeon: Eustace Moore, MD;  Location: [place];  Service: Neurosurgery;  Laterality: N/A;    There were no vitals filed for this visit.       Subjective Assessment - 10/06/15 1103    Subjective Patient has a history of back pain and surgery, one in 2009, then the most recent August 11, 2015 with a laminectomy of T11-12 and T12-L1.  He was fused in 2009 from L1-S1.  He reports that he had home PT and was having to use a walker up until about 2 weeks ago.     Patient Stated Goals Walk better   Currently in Pain? Yes   Pain Score 3    Pain Location Back   Pain Orientation Left;Lower   Pain Descriptors / Indicators Aching   Pain Type Chronic pain   Pain Onset More than a month ago   Pain Frequency Constant   Aggravating Factors  sitting   Pain Relieving Factors rest, change position            [facility] PT Assessment - 10/06/15 0001    Assessment   Medical Diagnosis S/P laminectomy, difficulty walking   Referring Provider Sherley Bounds   Onset Date/Surgical Date 08/11/15   Prior Therapy at home   Precautions   Precautions None   Precaution Comments limit lifting and bending   Balance Screen   Has the patient fallen in the past 6 months Yes   How many times? 3   Has the patient had a decrease in activity level because of a fear of falling?  No   Is the patient reluctant to leave their home because of a fear of  falling?  No   Home Environment   Additional Comments lives in mobile home while home is being built, 3 steps into the home   Prior Function   Level of Independence Independent   Vocation Part time employment   Vocation Requirements some lifting and travelling   Leisure no exercise   Posture/Postural Control   Posture Comments fwd head, rounded shoulders   ROM / Strength   AROM / PROM / Strength AROM;Strength   AROM   Overall AROM Comments lumbar ROM is decreased 75% for all motions except decreased 100% for extension.   Strength   Overall Strength Comments Left Hip 4-/5, right hip 4/5, left ankle 4-/5, right ankle 4+/5, right knee 4+/5, left knee 4-/5   Flexibility   Soft Tissue Assessment /Muscle Length --  some tightness in the HS and piriformis mms   Palpation   Palpation comment reports mild decreased sensation to touch moslty left thigh and  lower leg, large knot in the rleft thoraco lumbar area, but reports that this is long standing., he has mild tenderness in the left lower back   Ambulation/Gait   Gait Comments uses a SPC, slow gait, first few steps are slow with significant limp on the left side, he reports that he does not trust the leg and that he feels it will buckle on him, reports some decreased feeling int he right plantar foot                   [facility] Adult PT Treatment/Exercise - 10/06/15 0001    Exercises   Exercises Lumbar   Lumbar Exercises: Aerobic   Tread Mill NuStep level5 x 5 minutes   Lumbar Exercises: Machines for Strengthening   Cybex Knee Extension 5# 2x10   Cybex Knee Flexion 25# 2x10   Other Lumbar Machine Exercise 15# lats and seated rows                  PT Short Term Goals - 10/06/15 1132    PT SHORT TERM GOAL #1   Title will be independent in intiial HEP   Time 3   Period Weeks   Status New           PT Long Term Goals - 10/06/15 1132    PT LONG TERM GOAL #1   Title decrease pain 50%   Time 8   Period Weeks   Status New   PT LONG TERM GOAL #2   Title understand proper posture and body mechanics   Time 8   Period Weeks   Status New   PT LONG TERM GOAL #3   Title walk 500 feet without difficulty   Time 8   Period Weeks   Status New   PT LONG TERM GOAL #4   Title increase left LE strength to 4/5   Time 8   Period Weeks   Status New               Plan - 10/06/15 1125    Clinical Impression Statement Patient with a recent laminectomy on 08/11/15, with a multi level fusion in 2009, he reports that recently (prior to surgery) he was having decreased sensation and legs giving way with difficulty walking.  He is very limited in ROM due to past fusion, he is weak in the LE's with the left being weaker.  Uses a SPC for gait but is unsteady especially the first few steps.   Rehab Potential Good   PT  Frequency 2x / week   PT Duration 8 weeks   PT  Treatment/Interventions ADLs/Self Care Home Management;Electrical Stimulation;Moist Heat;Therapeutic exercise;Therapeutic activities;Neuromuscular re-education;Patient/family education;Manual techniques;Passive range of motion   PT Next Visit Plan Slowly add exercises and balance activities   Consulted and Agree with Plan of Care Patient      Patient will benefit from skilled therapeutic intervention in order to improve the following deficits and impairments:  Abnormal gait, Cardiopulmonary status limiting activity, Decreased activity tolerance, Decreased mobility, Decreased endurance, Decreased range of motion, Decreased strength, Difficulty walking, Increased muscle spasms, Impaired flexibility, Postural dysfunction, Improper body mechanics, Pain  Visit Diagnosis: Bilateral low back pain without sciatica - Plan: PT plan of care cert/re-cert  Difficulty in walking, not elsewhere classified - Plan: PT plan of care cert/re-cert      G-Codes - [id number]    Functional Assessment Tool Used foto 65% limitation   Functional Limitation Mobility: Walking and moving around   Mobility: Walking and Moving Around Current Status [phone]) At least 60 percent but less than 80 percent impaired, limited or restricted   Mobility: Walking and Moving Around Goal Status [phone]) At least 40 percent but less than 60 percent impaired, limited or restricted       Problem List Patient Active Problem List   Diagnosis Date Noted  . Thoracic stenosis 08/11/2015  . 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 Other and unspecified hyperlipidemia 03/11/2013  . UNSPECIFIED ESSENTIAL HYPERTENSION 09/01/2009  . DIZZINESS 09/01/2009  . CHEST PAIN UNSPECIFIED 09/01/2009    Sumner Boast., PT 10/06/2015, 11:38 AM  [address] [place], [place], [address] Phone: [phone]   Fax:  [phone]  Name: ARLING MISKIN MRN:  [id number] Date of Birth: 08-Oct-1950

## 2015-10-12 ENCOUNTER — Ambulatory Visit: Payer: PPO | Attending: Neurological Surgery | Admitting: Physical Therapy

## 2015-10-12 ENCOUNTER — Encounter: Payer: Self-pay | Admitting: Physical Therapy

## 2015-10-12 DIAGNOSIS — M545 Low back pain, unspecified: Secondary | ICD-10-CM

## 2015-10-12 DIAGNOSIS — R262 Difficulty in walking, not elsewhere classified: Secondary | ICD-10-CM | POA: Insufficient documentation

## 2015-10-12 NOTE — Therapy (Signed)
[address] [place], [place], [address] Phone: [phone]   Fax:  [phone]  Physical Therapy Treatment  Patient Details  Name: Russell Gonzales MRN: [id number] Date of Birth: 14-Aug-1950 Referring Provider: Sherley Bounds  Encounter Date: 10/12/2015      PT End of Session - 10/12/15 0926    Visit Number 2   Date for PT Re-Evaluation 12/06/15   PT Start Time 0845   PT Stop Time 0927   PT Time Calculation (min) 42 min   Activity Tolerance Patient tolerated treatment well   Behavior During Therapy [facility] for tasks assessed/performed      Past Medical History  Diagnosis Date  . Seasonal allergies   . Arthritis     "severe in my back" (08/11/2015)  . GERD (gastroesophageal reflux disease)   . Hyperlipidemia   . Hypertension   . Shortness of breath dyspnea     With exertion  . Hemorrhoids   . Chronic lower back pain     Past Surgical History  Procedure Laterality Date  . Anterior cervical decomp/discectomy fusion  05/2008  . Back surgery    . Colonoscopy w/ biopsies and polypectomy    . Polypectomy  2016  . Prostate biopsy  2012  . Ankle fracture surgery Left 1972    "it was crushed; has screws and plates"  . Fracture surgery    . Knee arthroscopy Left 1970s    "removed cyst"  . Posterior lumbar fusion  04/2008    "L1-S1; have rods and screws between 4 vertebrae"  . Thoracic laminectomy  08/11/2015    Decompressive thoracic laminectomy, medial facetectomy and foraminotomy T11 and T12  . Lumbar laminectomy/decompression microdiscectomy N/A 08/11/2015    Procedure: Laminectomy - Thoracic eleven - Thoracic tweleve;  Surgeon: Eustace Moore, MD;  Location: [place];  Service: Neurosurgery;  Laterality: N/A;    There were no vitals filed for this visit.      Subjective Assessment - 10/12/15 0840    Subjective "Just some soreness in my back that always there, been like that for years since the first  surgery"    Currently in Pain? No/denies   Pain Score 0-No pain  no pain just soreness                         [facility] Adult PT Treatment/Exercise - 10/12/15 0001    High Level Balance   High Level Balance Activities Side stepping   High Level Balance Comments Standing ball toss on flor and airex    Lumbar Exercises: Aerobic   Tread Mill NuStep level 5 x 6 minutes   Lumbar Exercises: Machines for Strengthening   Cybex Knee Extension #15 2x15    Cybex Knee Flexion 25# 2x15   Leg Press #30 2x15; LLE only #20 x10    Other Lumbar Machine Exercise #25 seated rows 2x15   Lumbar Exercises: Standing   Row 15 reps;Theraband  x2   Theraband Level (Row) Level 3 (Green)   Other Standing Lumbar Exercises Standing straight arm pull downs #25 2x10   Lumbar Exercises: Seated   Sit to Stand 10 reps  x10 with UE, x10 without UE                   PT Short Term Goals - 10/06/15 1132    PT SHORT TERM GOAL #1   Title will be independent in intiial  HEP   Time 3   Period Weeks   Status New           PT Long Term Goals - 10/06/15 1132    PT LONG TERM GOAL #1   Title decrease pain 50%   Time 8   Period Weeks   Status New   PT LONG TERM GOAL #2   Title understand proper posture and body mechanics   Time 8   Period Weeks   Status New   PT LONG TERM GOAL #3   Title walk 500 feet without difficulty   Time 8   Period Weeks   Status New   PT LONG TERM GOAL #4   Title increase left LE strength to 4/5   Time 8   Period Weeks   Status New               Plan - 10/12/15 [id number]    Clinical Impression Statement Pt progressed to machine level interventions as well. No issues with standing ball toss, pt does fatigue quickly with exercises and requires multiple rest breaks. Most difficult with standing march due to fatigue and LE weakness. Pt reports that's his RLE seems to give out more but the LLE is weaker.   Rehab Potential Good   PT Frequency 2x / week    PT Duration 8 weeks   PT Treatment/Interventions ADLs/Self Care Home Management;Electrical Stimulation;Moist Heat;Therapeutic exercise;Therapeutic activities;Neuromuscular re-education;Patient/family education;Manual techniques;Passive range of motion   PT Next Visit Plan \\exercises  and balance activities      Patient will benefit from skilled therapeutic intervention in order to improve the following deficits and impairments:  Abnormal gait, Cardiopulmonary status limiting activity, Decreased activity tolerance, Decreased mobility, Decreased endurance, Decreased range of motion, Decreased strength, Difficulty walking, Increased muscle spasms, Impaired flexibility, Postural dysfunction, Improper body mechanics, Pain  Visit Diagnosis: Bilateral low back pain without sciatica  Difficulty in walking, not elsewhere classified     Problem List Patient Active Problem List   Diagnosis Date Noted  . Thoracic stenosis 08/11/2015  . 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 Other and unspecified hyperlipidemia 03/11/2013  . UNSPECIFIED ESSENTIAL HYPERTENSION 09/01/2009  . DIZZINESS 09/01/2009  . CHEST PAIN UNSPECIFIED 09/01/2009    Scot Jun, PTA  10/12/2015, 9:29 AM  [address] [place], [place], [address] Phone: [phone]   Fax:  [phone]  Name: RAWSON RUPINSKI MRN: [id number] Date of Birth: 1951-04-10

## 2015-10-14 ENCOUNTER — Encounter: Payer: Self-pay | Admitting: Physical Therapy

## 2015-10-14 ENCOUNTER — Ambulatory Visit: Payer: PPO | Admitting: Physical Therapy

## 2015-10-14 DIAGNOSIS — M545 Low back pain, unspecified: Secondary | ICD-10-CM

## 2015-10-14 DIAGNOSIS — R262 Difficulty in walking, not elsewhere classified: Secondary | ICD-10-CM

## 2015-10-14 NOTE — Therapy (Signed)
[name], [place], [address] Phone: [phone]   Fax:  [phone]  Physical Therapy Treatment  Patient Details  Name: Russell Gonzales MRN: [id number] Date of Birth: 1950-09-26 Referring Provider: Sherley Bounds  Encounter Date: 10/14/2015      PT End of Session - 10/14/15 1143    Visit Number 3   Date for PT Re-Evaluation 12/06/15   PT Start Time 1100   PT Stop Time 1143   PT Time Calculation (min) 43 min   Activity Tolerance Patient tolerated treatment well   Behavior During Therapy [facility] for tasks assessed/performed      Past Medical History  Diagnosis Date  . Seasonal allergies   . Arthritis     "severe in my back" (08/11/2015)  . GERD (gastroesophageal reflux disease)   . Hyperlipidemia   . Hypertension   . Shortness of breath dyspnea     With exertion  . Hemorrhoids   . Chronic lower back pain     Past Surgical History  Procedure Laterality Date  . Anterior cervical decomp/discectomy fusion  05/2008  . Back surgery    . Colonoscopy w/ biopsies and polypectomy    . Polypectomy  2016  . Prostate biopsy  2012  . Ankle fracture surgery Left 1972    "it was crushed; has screws and plates"  . Fracture surgery    . Knee arthroscopy Left 1970s    "removed cyst"  . Posterior lumbar fusion  04/2008    "L1-S1; have rods and screws between 4 vertebrae"  . Thoracic laminectomy  08/11/2015    Decompressive thoracic laminectomy, medial facetectomy and foraminotomy T11 and T12  . Lumbar laminectomy/decompression microdiscectomy N/A 08/11/2015    Procedure: Laminectomy - Thoracic eleven - Thoracic tweleve;  Surgeon: Eustace Moore, MD;  Location: [facility];  Service: Neurosurgery;  Laterality: N/A;    There were no vitals filed for this visit.      Subjective Assessment - 10/14/15 1056    Subjective "Im all right, My back was a little sore this morning but I didn't sleep well, I got up and  took a muscle relaxer to go back to sleep"   Currently in Pain? Yes   Pain Score 4    Pain Location Back   Pain Orientation Lower   Pain Descriptors / Indicators Sore                         [facility] Adult PT Treatment/Exercise - 10/14/15 0001    Ambulation/Gait   Stairs Yes   Stairs Assistance 5: Supervision   Stair Management Technique One rail Right;Alternating pattern   Number of Stairs 12   Height of Stairs 6   Gait Comments slow pace going down   Lumbar Exercises: Aerobic   Tread Mill NuStep level 3 x 7 minutes   Lumbar Exercises: Machines for Strengthening   Cybex Knee Extension #15 2x15    Cybex Knee Flexion 35# 2x15   Leg Press #30 2x15; LLE only #20 2x10; #40 2x20    Other Lumbar Machine Exercise #35 seated rows & lats 2x15   Lumbar Exercises: Standing   Other Standing Lumbar Exercises Forward step ups x10 each    Other Standing Lumbar Exercises Hip abd & ext #2 2x10                  PT Short Term Goals - 10/06/15  [place] #1   Title will be independent in intiial HEP   Time 3   Period Weeks   Status New           PT Long Term Goals - 10/06/15 1132    PT LONG TERM GOAL #1   Title decrease pain 50%   Time 8   Period Weeks   Status New   PT LONG TERM GOAL #2   Title understand proper posture and body mechanics   Time 8   Period Weeks   Status New   PT LONG TERM GOAL #3   Title walk 500 feet without difficulty   Time 8   Period Weeks   Status New   PT LONG TERM GOAL #4   Title increase left LE strength to 4/5   Time 8   Period Weeks   Status New               Plan - 10/14/15 1143    Clinical Impression Statement Pt able to completed all exercises this date but requires frequent rest breaks. Pt does demo some mild instability when walking around clinic between interventions. Pt with heavy breathing but reports that he know he is out of shape.   Rehab Potential Good   PT Frequency 2x / week   PT  Duration 8 weeks   PT Treatment/Interventions ADLs/Self Care Home Management;Electrical Stimulation;Moist Heat;Therapeutic exercise;Therapeutic activities;Neuromuscular re-education;Patient/family education;Manual techniques;Passive range of motion   PT Next Visit Plan exercises and balance activities      Patient will benefit from skilled therapeutic intervention in order to improve the following deficits and impairments:  Abnormal gait, Cardiopulmonary status limiting activity, Decreased activity tolerance, Decreased mobility, Decreased endurance, Decreased range of motion, Decreased strength, Difficulty walking, Increased muscle spasms, Impaired flexibility, Postural dysfunction, Improper body mechanics, Pain  Visit Diagnosis: Bilateral low back pain without sciatica  Difficulty in walking, not elsewhere classified     Problem List Patient Active Problem List   Diagnosis Date Noted  . Thoracic stenosis 08/11/2015  . 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 Other and unspecified hyperlipidemia 03/11/2013  . UNSPECIFIED ESSENTIAL HYPERTENSION 09/01/2009  . DIZZINESS 09/01/2009  . CHEST PAIN UNSPECIFIED 09/01/2009    Scot Jun, PTA  10/14/2015, 11:47 AM  [place], [place], [address] Phone: [phone]   Fax:  [phone]  Name: Russell Gonzales MRN: [id number] Date of Birth: 27-Oct-1950

## 2015-10-18 ENCOUNTER — Encounter: Payer: Self-pay | Admitting: Physical Therapy

## 2015-10-18 ENCOUNTER — Ambulatory Visit: Payer: PPO | Admitting: Physical Therapy

## 2015-10-18 DIAGNOSIS — M545 Low back pain, unspecified: Secondary | ICD-10-CM

## 2015-10-18 DIAGNOSIS — R262 Difficulty in walking, not elsewhere classified: Secondary | ICD-10-CM

## 2015-10-18 NOTE — Therapy (Signed)
[place] [address] [place], [place], [address] Phone: [phone]   Fax:  [phone]  Physical Therapy Treatment  Patient Details  Name: Russell Gonzales MRN: [id number] Date of Birth: 20-Dec-1950 Referring Provider: Sherley Bounds  Encounter Date: 10/18/2015      PT End of Session - 10/18/15 1011    Visit Number 4   Date for PT Re-Evaluation 12/06/15   PT Start Time 0930   PT Stop Time 3220   PT Time Calculation (min) 44 min   Activity Tolerance Patient tolerated treatment well   Behavior During Therapy [facility] for tasks assessed/performed      Past Medical History  Diagnosis Date  . Seasonal allergies   . Arthritis     "severe in my back" (08/11/2015)  . GERD (gastroesophageal reflux disease)   . Hyperlipidemia   . Hypertension   . Shortness of breath dyspnea     With exertion  . Hemorrhoids   . Chronic lower back pain     Past Surgical History  Procedure Laterality Date  . Anterior cervical decomp/discectomy fusion  05/2008  . Back surgery    . Colonoscopy w/ biopsies and polypectomy    . Polypectomy  2016  . Prostate biopsy  2012  . Ankle fracture surgery Left 1972    "it was crushed; has screws and plates"  . Fracture surgery    . Knee arthroscopy Left 1970s    "removed cyst"  . Posterior lumbar fusion  04/2008    "L1-S1; have rods and screws between 4 vertebrae"  . Thoracic laminectomy  08/11/2015    Decompressive thoracic laminectomy, medial facetectomy and foraminotomy T11 and T12  . Lumbar laminectomy/decompression microdiscectomy N/A 08/11/2015    Procedure: Laminectomy - Thoracic eleven - Thoracic tweleve;  Surgeon: Eustace Moore, MD;  Location: [place] [facility];  Service: Neurosurgery;  Laterality: N/A;    There were no vitals filed for this visit.      Subjective Assessment - 10/18/15 0931    Subjective "A little sore in my low back, I may have over did it yesterday with yard work."   Currently in Pain? Yes   Pain Score 3    Pain Location Back   Pain Orientation Lower                         [facility] Adult PT Treatment/Exercise - 10/18/15 0001    Lumbar Exercises: Stretches   Passive Hamstring Stretch 4 reps;10 seconds   Piriformis Stretch 3 reps;10 seconds   Lumbar Exercises: Aerobic   Tread Mill NuStep level 3 x 7 minutes   Lumbar Exercises: Machines for Strengthening   Leg Press #30 2x15; LLE only #20 2x10; Heel raises #60 x20   Other Lumbar Machine Exercise #45 seated rows & lats 2x15   Lumbar Exercises: Standing   Other Standing Lumbar Exercises Lateral step ups X10 6 in box; Straight arm pull down #35 2x15      Other Standing Lumbar Exercises Hip ext #5 2x10   Lumbar Exercises: Supine   Bridge 10 reps;1 second  x2   Straight Leg Raise 10 reps;2 seconds                  PT Short Term Goals - 10/06/15 1132    PT SHORT TERM GOAL #1   Title will be independent in intiial HEP   Time 3   Period Weeks  Status New           PT Long Term Goals - 10/18/15 1010    PT LONG TERM GOAL #2   Title understand proper posture and body mechanics   Status Partially Met               Plan - 10/18/15 1012    Clinical Impression Statement Again pt continues to fatigue with exercises, again multiple rest breaks. Continues with mild instability when ambulating around clinic. Pt reports that he feels like he can do a little more. No issues completing exercises.   Rehab Potential Good   PT Frequency 2x / week   PT Duration 8 weeks   PT Treatment/Interventions ADLs/Self Care Home Management;Electrical Stimulation;Moist Heat;Therapeutic exercise;Therapeutic activities;Neuromuscular re-education;Patient/family education;Manual techniques;Passive range of motion   PT Next Visit Plan Lumbar ROM      Patient will benefit from skilled therapeutic intervention in order to improve the following deficits and impairments:  Abnormal gait,  Cardiopulmonary status limiting activity, Decreased activity tolerance, Decreased mobility, Decreased endurance, Decreased range of motion, Decreased strength, Difficulty walking, Increased muscle spasms, Impaired flexibility, Postural dysfunction, Improper body mechanics, Pain  Visit Diagnosis: Bilateral low back pain without sciatica  Difficulty in walking, not elsewhere classified     Problem List Patient Active Problem List   Diagnosis Date Noted  . Thoracic stenosis 08/11/2015  . 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 Other and unspecified hyperlipidemia 03/11/2013  . UNSPECIFIED ESSENTIAL HYPERTENSION 09/01/2009  . DIZZINESS 09/01/2009  . CHEST PAIN UNSPECIFIED 09/01/2009    Scot Jun, PTA  10/18/2015, 10:14 AM  [place], [place], [address] Phone: [phone]   Fax:  [phone]  Name: Russell Gonzales MRN: [id number] Date of Birth: Dec 22, 1950

## 2015-10-21 ENCOUNTER — Encounter: Payer: Self-pay | Admitting: Physical Therapy

## 2015-10-21 ENCOUNTER — Ambulatory Visit: Payer: PPO | Admitting: Physical Therapy

## 2015-10-21 DIAGNOSIS — R262 Difficulty in walking, not elsewhere classified: Secondary | ICD-10-CM

## 2015-10-21 DIAGNOSIS — M545 Low back pain, unspecified: Secondary | ICD-10-CM

## 2015-10-21 NOTE — Therapy (Signed)
[place], [place], [address] Phone: [phone]   Fax:  [phone]  Physical Therapy Treatment  Patient Details  Name: Russell Gonzales MRN: [id number] Date of Birth: [birth year] Referring Provider: Sherley Bounds  Encounter Date: 10/21/2015      PT End of Session - 10/21/15 0839    Visit Number 5   Date for PT Re-Evaluation 12/06/15   PT Start Time 0800   PT Stop Time 0841   PT Time Calculation (min) 41 min   Activity Tolerance Patient tolerated treatment well   Behavior During Therapy [facility] for tasks assessed/performed      Past Medical History  Diagnosis Date  . Seasonal allergies   . Arthritis     "severe in my back" (08/11/2015)  . GERD (gastroesophageal reflux disease)   . Hyperlipidemia   . Hypertension   . Shortness of breath dyspnea     With exertion  . Hemorrhoids   . Chronic lower back pain     Past Surgical History  Procedure Laterality Date  . Anterior cervical decomp/discectomy fusion  05/2008  . Back surgery    . Colonoscopy w/ biopsies and polypectomy    . Polypectomy  2016  . Prostate biopsy  2012  . Ankle fracture surgery Left 1972    "it was crushed; has screws and plates"  . Fracture surgery    . Knee arthroscopy Left 1970s    "removed cyst"  . Posterior lumbar fusion  04/2008    "L1-S1; have rods and screws between 4 vertebrae"  . Thoracic laminectomy  08/11/2015    Decompressive thoracic laminectomy, medial facetectomy and foraminotomy T11 and T12  . Lumbar laminectomy/decompression microdiscectomy N/A 08/11/2015    Procedure: Laminectomy - Thoracic eleven - Thoracic tweleve;  Surgeon: Eustace Moore, MD;  Location: [place] [facility];  Service: Neurosurgery;  Laterality: N/A;    There were no vitals filed for this visit.      Subjective Assessment - 10/21/15 0800    Subjective "A little sore this morning, but I always am first thing in the morning."   Currently in  Pain? Yes   Pain Score 3    Pain Location Back   Pain Orientation Left;Lower                         [facility] Adult PT Treatment/Exercise - 10/21/15 0001    High Level Balance   High Level Balance Comments Stepping over objects, single and double step in between. Side steps over objects.    Lumbar Exercises: Aerobic   Tread Mill NuStep level 3 x 7 minutes   Lumbar Exercises: Machines for Strengthening   Cybex Knee Extension #20 2x15    Cybex Knee Flexion 45# 2x15   Other Lumbar Machine Exercise #45 seated rows & lats 2x15   Lumbar Exercises: Standing   Other Standing Lumbar Exercises Lateral step ups X10 6 in box; Straight arm pull down #45 2x15; Functional Box carry #5 3f x2       Lumbar Exercises: Seated   Sit to Stand 10 reps  x2 with blue ball                   PT Short Term Goals - 10/06/15 1132    PT SHORT TERM GOAL #1   Title will be independent in intiial HEP   Time 3   Period Weeks   Status  New           PT Long Term Goals - 10/18/15 1010    PT LONG TERM GOAL #2   Title understand proper posture and body mechanics   Status Partially Met               Plan - 10/21/15 0839    Clinical Impression Statement Tolerated new balance intervention well, some LOB side stepping over objects. Some hesitation with lateral step ups due to balance. Continues to fatigue quickly.   Rehab Potential Good   PT Frequency 2x / week   PT Duration 8 weeks   PT Treatment/Interventions ADLs/Self Care Home Management;Electrical Stimulation;Moist Heat;Therapeutic exercise;Therapeutic activities;Neuromuscular re-education;Patient/family education;Manual techniques;Passive range of motion   PT Next Visit Plan Lumbar ROM, PT reports that he will be out if the state next [place].      Patient will benefit from skilled therapeutic intervention in order to improve the following deficits and impairments:  Abnormal gait, Cardiopulmonary status limiting activity,  Decreased activity tolerance, Decreased mobility, Decreased endurance, Decreased range of motion, Decreased strength, Difficulty walking, Increased muscle spasms, Impaired flexibility, Postural dysfunction, Improper body mechanics, Pain  Visit Diagnosis: Bilateral low back pain without sciatica  Difficulty in walking, not elsewhere classified     Problem List Patient Active Problem List   Diagnosis Date Noted  . Thoracic stenosis 08/11/2015  . 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 Other and unspecified hyperlipidemia 03/11/2013  . UNSPECIFIED ESSENTIAL HYPERTENSION 09/01/2009  . DIZZINESS 09/01/2009  . CHEST PAIN UNSPECIFIED 09/01/2009    Scot Jun, PTA  10/21/2015, 8:41 AM  [place], [place], [address] Phone: [phone]   Fax:  [phone]  Name: Russell Gonzales MRN: [id number] Date of Birth: 14-Aug-1950

## 2015-11-11 ENCOUNTER — Encounter: Payer: Self-pay | Admitting: Physical Therapy

## 2015-11-11 ENCOUNTER — Ambulatory Visit: Payer: PPO | Attending: Neurological Surgery | Admitting: Physical Therapy

## 2015-11-11 DIAGNOSIS — M545 Low back pain, unspecified: Secondary | ICD-10-CM

## 2015-11-11 DIAGNOSIS — R262 Difficulty in walking, not elsewhere classified: Secondary | ICD-10-CM | POA: Diagnosis present

## 2015-11-11 NOTE — Therapy (Signed)
[place], [place], [address] Phone: [phone]   Fax:  [phone]  Physical Therapy Treatment  Patient Details  Name: MINA CARLISI MRN: [id number] Date of Birth: 02/29/1951 Referring Provider: Sherley Bounds  Encounter Date: 11/11/2015      PT End of Session - 11/11/15 0927    Visit Number 6   Date for PT Re-Evaluation 12/06/15   PT Start Time 0845   PT Stop Time 0929   PT Time Calculation (min) 44 min   Activity Tolerance Patient tolerated treatment well   Behavior During Therapy [facility] for tasks assessed/performed      Past Medical History  Diagnosis Date  . Seasonal allergies   . Arthritis     "severe in my back" (08/11/2015)  . GERD (gastroesophageal reflux disease)   . Hyperlipidemia   . Hypertension   . Shortness of breath dyspnea     With exertion  . Hemorrhoids   . Chronic lower back pain     Past Surgical History  Procedure Laterality Date  . Anterior cervical decomp/discectomy fusion  05/2008  . Back surgery    . Colonoscopy w/ biopsies and polypectomy    . Polypectomy  2016  . Prostate biopsy  2012  . Ankle fracture surgery Left 1972    "it was crushed; has screws and plates"  . Fracture surgery    . Knee arthroscopy Left 1970s    "removed cyst"  . Posterior lumbar fusion  04/2008    "L1-S1; have rods and screws between 4 vertebrae"  . Thoracic laminectomy  08/11/2015    Decompressive thoracic laminectomy, medial facetectomy and foraminotomy T11 and T12  . Lumbar laminectomy/decompression microdiscectomy N/A 08/11/2015    Procedure: Laminectomy - Thoracic eleven - Thoracic tweleve;  Surgeon: Eustace Moore, MD;  Location: [place];  Service: Neurosurgery;  Laterality: N/A;    There were no vitals filed for this visit.      Subjective Assessment - 11/11/15 0847    Subjective "All right I don't seem to be getting any better"    Currently in Pain? Yes   Pain Score  3    Pain Location Back   Pain Orientation Lower   Pain Descriptors / Indicators Sore            [facility] PT Assessment - 11/11/15 0001    Strength   Overall Strength Comments Left Hip 4-/5, right hip 4+/5, left ankle 4/5, right ankle 5/5, right knee 5/5, left knee 4/5                     [facility] Adult PT Treatment/Exercise - 11/11/15 0001    High Level Balance   High Level Balance Comments Negociated over objects, one and 2 feet between.   Lumbar Exercises: Aerobic   Tread Mill NuStep level 3 x 7 minutes   Lumbar Exercises: Machines for Strengthening   Cybex Knee Flexion 45# 2x15   Leg Press #30 2x10; LLE only #20 2x15; Heel raises #60 x20   Other Lumbar Machine Exercise #45 seated rows & lats 2x15   Lumbar Exercises: Seated   Sit to Stand 10 reps  x2                  PT Short Term Goals - 10/06/15 1132    PT SHORT TERM GOAL #1   Title will be independent in intiial HEP   Time 3  Period Weeks   Status New           PT Long Term Goals - 10/18/15 1010    PT LONG TERM GOAL #2   Title understand proper posture and body mechanics   Status Partially Met               Plan - 11/11/15 0932    Clinical Impression Statement Pt return to therapy after three weeks. Pt report that he does not feel like that he is getting better and he may have picked up a few pounds. Pt reports to physical activity during his time away from therapy. Pt completed all exercises well this date but does fatigue quickly. Pt did reports a sharp pain in the L side of his back  on leg press using LLE only. Pt reported pain in the second set, but pain went away when the load was taken off..    Rehab Potential Good   PT Frequency 2x / week   PT Duration 8 weeks   PT Treatment/Interventions ADLs/Self Care Home Management;Electrical Stimulation;Moist Heat;Therapeutic exercise;Therapeutic activities;Neuromuscular re-education;Patient/family education;Manual techniques;Passive  range of motion   PT Next Visit Plan assess tx, lumbar ROM, functional endurance.      Patient will benefit from skilled therapeutic intervention in order to improve the following deficits and impairments:  Abnormal gait, Cardiopulmonary status limiting activity, Decreased activity tolerance, Decreased mobility, Decreased endurance, Decreased range of motion, Decreased strength, Difficulty walking, Increased muscle spasms, Impaired flexibility, Postural dysfunction, Improper body mechanics, Pain  Visit Diagnosis: Difficulty in walking, not elsewhere classified  Bilateral low back pain without sciatica     Problem List Patient Active Problem List   Diagnosis Date Noted  . Thoracic stenosis 08/11/2015  . 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 Other and unspecified hyperlipidemia 03/11/2013  . UNSPECIFIED ESSENTIAL HYPERTENSION 09/01/2009  . DIZZINESS 09/01/2009  . CHEST PAIN UNSPECIFIED 09/01/2009    Scot Jun, PTA  11/11/2015, 9:36 AM  [place], [place], [address] Phone: [phone]   Fax:  [phone]  Name: MERRIL NAGY MRN: [id number] Date of Birth: 1950/09/05

## 2015-11-15 ENCOUNTER — Encounter: Payer: Self-pay | Admitting: Physical Therapy

## 2015-11-15 ENCOUNTER — Ambulatory Visit: Payer: PPO | Admitting: Physical Therapy

## 2015-11-15 DIAGNOSIS — R262 Difficulty in walking, not elsewhere classified: Secondary | ICD-10-CM

## 2015-11-15 DIAGNOSIS — M545 Low back pain, unspecified: Secondary | ICD-10-CM

## 2015-11-15 NOTE — Therapy (Signed)
[place], [place], [address] Phone: [phone]   Fax:  [phone]  Physical Therapy Treatment  Patient Details  Name: Russell Gonzales MRN: [id number] Date of Birth: Sep 02, 1950 Referring Provider: Sherley Bounds  Encounter Date: 11/15/2015      PT End of Session - 11/15/15 0927    Visit Number 7   Date for PT Re-Evaluation 12/06/15   PT Start Time 0845   PT Stop Time 0929   PT Time Calculation (min) 44 min   Activity Tolerance Patient tolerated treatment well   Behavior During Therapy [facility] for tasks assessed/performed      Past Medical History  Diagnosis Date  . Seasonal allergies   . Arthritis     "severe in my back" (08/11/2015)  . GERD (gastroesophageal reflux disease)   . Hyperlipidemia   . Hypertension   . Shortness of breath dyspnea     With exertion  . Hemorrhoids   . Chronic lower back pain     Past Surgical History  Procedure Laterality Date  . Anterior cervical decomp/discectomy fusion  05/2008  . Back surgery    . Colonoscopy w/ biopsies and polypectomy    . Polypectomy  2016  . Prostate biopsy  2012  . Ankle fracture surgery Left 1972    "it was crushed; has screws and plates"  . Fracture surgery    . Knee arthroscopy Left 1970s    "removed cyst"  . Posterior lumbar fusion  04/2008    "L1-S1; have rods and screws between 4 vertebrae"  . Thoracic laminectomy  08/11/2015    Decompressive thoracic laminectomy, medial facetectomy and foraminotomy T11 and T12  . Lumbar laminectomy/decompression microdiscectomy N/A 08/11/2015    Procedure: Laminectomy - Thoracic eleven - Thoracic tweleve;  Surgeon: Eustace Moore, MD;  Location: [place] [facility];  Service: Neurosurgery;  Laterality: N/A;    There were no vitals filed for this visit.      Subjective Assessment - 11/15/15 0847    Subjective "A little bit sore latley, didnt sleep well last night"   Currently in Pain? Yes   Pain  Score 3    Pain Location Back   Pain Orientation Left;Lower                         [facility] Adult PT Treatment/Exercise - 11/15/15 0001    Lumbar Exercises: Aerobic   Elliptical I-5 R3 x2 minutes   Tread Mill NuStep level 3 x 7 minutes   Lumbar Exercises: Machines for Strengthening   Leg Press #30 2x15; LLE only #20 x10; Heel raises #60 2x15   Other Lumbar Machine Exercise #45 seated rows & lats 2x15   Lumbar Exercises: Standing   Other Standing Lumbar Exercises Lateral step ups X10 6 in box; Straight arm pull down #35 2x15   Other Standing Lumbar Exercises dead lifts #5 2x10; overhead extensions with blue ball 2x10    Lumbar Exercises: Seated   Sit to Stand 10 reps  #5, x10                   PT Short Term Goals - 10/06/15 1132    PT SHORT TERM GOAL #1   Title will be independent in intiial HEP   Time 3   Period Weeks   Status New           PT Long Term Goals - 10/18/15 1010  PT LONG TERM GOAL #2   Title understand proper posture and body mechanics   Status Partially Met               Plan - 11/15/15 0927    Clinical Impression Statement Pt with some mild gait instability when ambulating around clinic. Pt does report some mild low back pain and soreness. Completed all additional interventions well, no movements cause increased pain.  Increases time needed for frequent seated rest breaks.   Rehab Potential Good   PT Frequency 2x / week   PT Duration 8 weeks   PT Treatment/Interventions ADLs/Self Care Home Management;Electrical Stimulation;Moist Heat;Therapeutic exercise;Therapeutic activities;Neuromuscular re-education;Patient/family education;Manual techniques;Passive range of motion   PT Next Visit Plan  lumbar ROM, functional endurance.      Patient will benefit from skilled therapeutic intervention in order to improve the following deficits and impairments:  Abnormal gait, Cardiopulmonary status limiting activity, Decreased  activity tolerance, Decreased mobility, Decreased endurance, Decreased range of motion, Decreased strength, Difficulty walking, Increased muscle spasms, Impaired flexibility, Postural dysfunction, Improper body mechanics, Pain  Visit Diagnosis: Difficulty in walking, not elsewhere classified  Bilateral low back pain without sciatica     Problem List Patient Active Problem List   Diagnosis Date Noted  . Thoracic stenosis 08/11/2015  . 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 Other and unspecified hyperlipidemia 03/11/2013  . UNSPECIFIED ESSENTIAL HYPERTENSION 09/01/2009  . DIZZINESS 09/01/2009  . CHEST PAIN UNSPECIFIED 09/01/2009    Scot Jun, PTA  11/15/2015, 9:29 AM  [place], [place], [address] Phone: [phone]   Fax:  [phone]  Name: Russell Gonzales MRN: [id number] Date of Birth: 08-12-1950

## 2015-11-18 ENCOUNTER — Ambulatory Visit: Payer: PPO | Admitting: Physical Therapy

## 2015-11-18 ENCOUNTER — Encounter: Payer: Self-pay | Admitting: Physical Therapy

## 2015-11-18 DIAGNOSIS — M545 Low back pain, unspecified: Secondary | ICD-10-CM

## 2015-11-18 DIAGNOSIS — R262 Difficulty in walking, not elsewhere classified: Secondary | ICD-10-CM

## 2015-11-18 NOTE — Therapy (Signed)
[address] [place], [place], [address] Phone: [phone]   Fax:  [phone]  Physical Therapy Treatment  Patient Details  Name: Russell Gonzales MRN: [id number] Date of Birth: 08-Aug-1950 Referring Provider: Sherley Bounds  Encounter Date: 11/18/2015      PT End of Session - 11/18/15 1555    Visit Number 8   Date for PT Re-Evaluation 12/06/15   PT Start Time 0626   PT Stop Time 1630   PT Time Calculation (min) 60 min      Past Medical History  Diagnosis Date  . Seasonal allergies   . Arthritis     "severe in my back" (08/11/2015)  . GERD (gastroesophageal reflux disease)   . Hyperlipidemia   . Hypertension   . Shortness of breath dyspnea     With exertion  . Hemorrhoids   . Chronic lower back pain     Past Surgical History  Procedure Laterality Date  . Anterior cervical decomp/discectomy fusion  05/2008  . Back surgery    . Colonoscopy w/ biopsies and polypectomy    . Polypectomy  2016  . Prostate biopsy  2012  . Ankle fracture surgery Left 1972    "it was crushed; has screws and plates"  . Fracture surgery    . Knee arthroscopy Left 1970s    "removed cyst"  . Posterior lumbar fusion  04/2008    "L1-S1; have rods and screws between 4 vertebrae"  . Thoracic laminectomy  08/11/2015    Decompressive thoracic laminectomy, medial facetectomy and foraminotomy T11 and T12  . Lumbar laminectomy/decompression microdiscectomy N/A 08/11/2015    Procedure: Laminectomy - Thoracic eleven - Thoracic tweleve;  Surgeon: Eustace Moore, MD;  Location: [facility];  Service: Neurosurgery;  Laterality: N/A;    There were no vitals filed for this visit.      Subjective Assessment - 11/18/15 1533    Subjective give out in the afternoons   Currently in Pain? Yes   Pain Score 3    Pain Location Back   Pain Orientation Left;Lower                         [facility] Adult PT Treatment/Exercise -  11/18/15 0001    Lumbar Exercises: Aerobic   Tread Mill Nustep L 4 7 min   Lumbar Exercises: Machines for Strengthening   Cybex Knee Extension #20 2x15    Cybex Knee Flexion 45# 2x15   Leg Press #40 2x15; LLE only #20 2 x10; Heel raises #60 2x15  increased pain with Left SL press   Other Lumbar Machine Exercise #45 seated rows & lats 2x15   Modalities   Modalities Moist Heat;Electrical Stimulation   Moist Heat Therapy   Number Minutes Moist Heat 15 Minutes   Moist Heat Location Lumbar Spine   Electrical Stimulation   Electrical Stimulation Location LB   Electrical Stimulation Action IFC   Electrical Stimulation Goals Pain                PT Education - 11/18/15 1550    Education provided Yes   Education Details gym safety ,machines,weights and progression. Discussed water exercise.   Person(s) Educated Patient   Methods Explanation;Demonstration;Handout   Comprehension Verbalized understanding          PT Short Term Goals - 10/06/15 1132    PT SHORT TERM GOAL #1   Title will be independent in  intiial HEP   Time 3   Period Weeks   Status New           PT Long Term Goals - 10/18/15 1010    PT LONG TERM GOAL #2   Title understand proper posture and body mechanics   Status Partially Met               Plan - 11/18/15 1556    Clinical Impression Statement educated on gym retun and water ex. trail with estima dn heat for increased pain on left side   PT Next Visit Plan transition to gym      Patient will benefit from skilled therapeutic intervention in order to improve the following deficits and impairments:  Abnormal gait, Cardiopulmonary status limiting activity, Decreased activity tolerance, Decreased mobility, Decreased endurance, Decreased range of motion, Decreased strength, Difficulty walking, Increased muscle spasms, Impaired flexibility, Postural dysfunction, Improper body mechanics, Pain  Visit Diagnosis: Difficulty in walking, not  elsewhere classified  Bilateral low back pain without sciatica     Problem List Patient Active Problem List   Diagnosis Date Noted  . Thoracic stenosis 08/11/2015  . 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 Other and unspecified hyperlipidemia 03/11/2013  . UNSPECIFIED ESSENTIAL HYPERTENSION 09/01/2009  . DIZZINESS 09/01/2009  . CHEST PAIN UNSPECIFIED 09/01/2009    PAYSEUR,ANGIE PTA 11/18/2015, 3:58 PM  [address] [place], [place], [address] Phone: [phone]   Fax:  [phone]  Name: Russell Gonzales MRN: [id number] Date of Birth: 1950/12/29

## 2015-12-27 ENCOUNTER — Other Ambulatory Visit: Payer: Self-pay | Admitting: Neurological Surgery

## 2015-12-27 ENCOUNTER — Other Ambulatory Visit (HOSPITAL_COMMUNITY): Payer: Self-pay | Admitting: Neurological Surgery

## 2015-12-27 DIAGNOSIS — G959 Disease of spinal cord, unspecified: Secondary | ICD-10-CM

## 2015-12-28 ENCOUNTER — Ambulatory Visit (HOSPITAL_COMMUNITY): Payer: PPO

## 2016-01-09 ENCOUNTER — Encounter (HOSPITAL_COMMUNITY): Payer: Self-pay

## 2016-01-09 ENCOUNTER — Inpatient Hospital Stay (HOSPITAL_COMMUNITY)
Admission: EM | Admit: 2016-01-09 | Discharge: 2016-01-12 | DRG: 552 | Disposition: A | Discharge status: Home / Self Care | Payer: PPO | Attending: Neurological Surgery | Admitting: Neurological Surgery

## 2016-01-09 DIAGNOSIS — G959 Disease of spinal cord, unspecified: Secondary | ICD-10-CM | POA: Diagnosis present

## 2016-01-09 DIAGNOSIS — Z981 Arthrodesis status: Secondary | ICD-10-CM

## 2016-01-09 DIAGNOSIS — I10 Essential (primary) hypertension: Secondary | ICD-10-CM | POA: Diagnosis present

## 2016-01-09 DIAGNOSIS — E785 Hyperlipidemia, unspecified: Secondary | ICD-10-CM | POA: Diagnosis present

## 2016-01-09 DIAGNOSIS — M4804 Spinal stenosis, thoracic region: Secondary | ICD-10-CM | POA: Diagnosis present

## 2016-01-09 DIAGNOSIS — Z86010 Personal history of colonic polyps: Secondary | ICD-10-CM

## 2016-01-09 DIAGNOSIS — K219 Gastro-esophageal reflux disease without esophagitis: Secondary | ICD-10-CM | POA: Diagnosis present

## 2016-01-09 DIAGNOSIS — M545 Low back pain: Secondary | ICD-10-CM | POA: Diagnosis present

## 2016-01-09 DIAGNOSIS — M419 Scoliosis, unspecified: Secondary | ICD-10-CM | POA: Diagnosis present

## 2016-01-09 DIAGNOSIS — Z87891 Personal history of nicotine dependence: Secondary | ICD-10-CM

## 2016-01-09 DIAGNOSIS — M549 Dorsalgia, unspecified: Secondary | ICD-10-CM | POA: Diagnosis present

## 2016-01-09 MED ORDER — DEXAMETHASONE SODIUM PHOSPHATE 10 MG/ML IJ SOLN
10.0000 mg | Freq: Once | INTRAMUSCULAR | Status: DC
  Filled 2016-01-09: qty 1

## 2016-01-09 MED ORDER — DEXAMETHASONE SODIUM PHOSPHATE 10 MG/ML IJ SOLN
10.0000 mg | Freq: Once | INTRAMUSCULAR | Status: AC
  Administered 2016-01-09: 10 mg via INTRAVENOUS

## 2016-01-09 MED ORDER — HYDROMORPHONE HCL 1 MG/ML IJ SOLN
1.0000 mg | INTRAMUSCULAR | Status: AC | PRN
  Administered 2016-01-10 (×2): 1 mg via INTRAVENOUS
  Filled 2016-01-09 (×2): qty 1

## 2016-01-09 MED ORDER — ONDANSETRON HCL 4 MG/2ML IJ SOLN
4.0000 mg | Freq: Once | INTRAMUSCULAR | Status: DC
  Filled 2016-01-09 (×2): qty 2

## 2016-01-09 MED ORDER — DEXTROSE-NACL 5-0.45 % IV SOLN
INTRAVENOUS | Status: AC
  Administered 2016-01-10: via INTRAVENOUS

## 2016-01-09 MED ORDER — DIAZEPAM 5 MG PO TABS: 5.0000 mg | ORAL_TABLET | Freq: Once | ORAL | Status: DC

## 2016-01-09 MED ORDER — MORPHINE SULFATE (PF) 4 MG/ML IV SOLN
4.0000 mg | INTRAVENOUS | Status: AC | PRN
Start: 2016-01-09 — End: 2016-01-10
  Administered 2016-01-10 (×3): 4 mg via INTRAVENOUS
  Filled 2016-01-09 (×3): qty 1

## 2016-01-09 MED ORDER — FENTANYL CITRATE (PF) 100 MCG/2ML IJ SOLN
75.0000 ug | Freq: Once | INTRAMUSCULAR | Status: AC
  Administered 2016-01-09: 75 ug via INTRAVENOUS
  Filled 2016-01-09: qty 2

## 2016-01-09 MED ORDER — DIAZEPAM 5 MG/ML IJ SOLN
5.0000 mg | Freq: Once | INTRAMUSCULAR | Status: AC
  Administered 2016-01-09: 5 mg via INTRAVENOUS
  Filled 2016-01-09: qty 2

## 2016-01-09 NOTE — ED Triage Notes (Signed)
Pt complaining of low back pain and leg numbness. Pt with recent T11-12 laminectomy by Ronnald Ramp, MD. States painful to stand/walk.

## 2016-01-09 NOTE — ED Provider Notes (Signed)
[facility] DEPT Provider Note   CSN: [id number] Arrival date & time: 01/09/16  [id number]  First Provider Contact:  First MD Initiated Contact with Patient 01/09/16 2014        History   Chief Complaint Chief Complaint  Patient presents with  . Back Pain  . Leg Pain  . Numbness    HPI Russell Gonzales is a 65 y.o. male.  HPI   Mr. Desautel is a 65 year old male history of thoracic stenosis with chronic low back pain with multiple surgeries (most recent thoracic laminectomy March 2017), he presents to the emergency room with complaint of acutely worsening low back pain over the past 3-4 days with associated bilateral leg and thigh paresthesias, numbness, weakness and difficulty ambulating, worse in the left leg than the right. He was staying in [place] in his trailer and he was unable to ambulate around without holding onto the furniture and countertops.  He is taking his prescribed tramadol without much relief. He called his neurosurgeon who scheduled him next Wednesday for myelogram.  He was unable to drive himself home so his wife when he got up from [place].  He states he is unable to bear weight because of the pain and numbness. He called the neurosurgeons again today who told to come into the ER to be worked up.  He denies any urinary retention, difficulty with stream, urinary or bladder incontinence, saddle anesthesia, fevers, chills, sweats.   Past Medical History:  Diagnosis Date  . Arthritis    "severe in my back" (08/11/2015)  . Chronic lower back pain   . GERD (gastroesophageal reflux disease)   . Hemorrhoids   . Hyperlipidemia   . Hypertension   . Seasonal allergies   . Shortness of breath dyspnea    With exertion    Patient Active Problem List   Diagnosis Date Noted  . Thoracic stenosis 08/11/2015  . 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 Other and unspecified hyperlipidemia 03/11/2013  . UNSPECIFIED ESSENTIAL HYPERTENSION 09/01/2009  . DIZZINESS 09/01/2009   . CHEST PAIN UNSPECIFIED 09/01/2009    Past Surgical History:  Procedure Laterality Date  . ANKLE FRACTURE SURGERY Left 1972   "it was crushed; has screws and plates"  . ANTERIOR CERVICAL DECOMP/DISCECTOMY FUSION  05/2008  . BACK SURGERY    . COLONOSCOPY W/ BIOPSIES AND POLYPECTOMY    . FRACTURE SURGERY    . KNEE ARTHROSCOPY Left 1970s   "removed cyst"  . LUMBAR LAMINECTOMY/DECOMPRESSION MICRODISCECTOMY N/A 08/11/2015   Procedure: Laminectomy - Thoracic eleven - Thoracic tweleve;  Surgeon: Eustace Moore, MD;  Location: [facility];  Service: Neurosurgery;  Laterality: N/A;  . POLYPECTOMY  2016  . POSTERIOR LUMBAR FUSION  04/2008   "L1-S1; have rods and screws between 4 vertebrae"  . PROSTATE BIOPSY  2012  . THORACIC LAMINECTOMY  08/11/2015   Decompressive thoracic laminectomy, medial facetectomy and foraminotomy T11 and T12       Home Medications    Prior to Admission medications   Medication Sig Start Date End Date Taking? Authorizing Provider  atorvastatin (LIPITOR) 20 MG tablet TAKE ONE TABLET BY MOUTH ONCE DAILY Patient taking differently: TAKE ONE TABLET (20 mg) BY MOUTH every evening 09/15/15  Yes [name], MD  fish oil-omega-3 fatty acids 1000 MG capsule Take 1 g by mouth 2 (two) times daily.     Yes Historical Provider, MD  lisinopril (PRINIVIL,ZESTRIL) 40 MG tablet Take 1 tablet daily Patient taking differently: Take 40 mg  by mouth every evening.  03/30/15  Yes [name], MD  Ferrous Sulfate (IRON) 325 (65 FE) MG TABS Take 325 mg by mouth daily.     Historical Provider, MD  hydrochlorothiazide (MICROZIDE) 12.5 MG capsule Take 1 capsule (12.5 mg total) by mouth daily. 03/30/15   [name], MD  ibuprofen (ADVIL,MOTRIN) 400 MG tablet Take 400 mg by mouth 2 (two) times daily as needed for mild pain.     Historical Provider, MD  ketoconazole (NIZORAL) 2 % cream Apply 1 application topically daily as needed for irritation. Reported on 07/01/2015 09/21/10   Historical  Provider, MD  Multiple Vitamins-Minerals (MULTIVITAMIN ADULT PO) Take 1 tablet by mouth daily.     Historical Provider, MD  predniSONE (STERAPRED UNI-PAK 21 TAB) 10 MG (21) TBPK tablet Take 10 mg by mouth daily.    Historical Provider, MD  traMADol (ULTRAM) 50 MG tablet Take 1 tablet (50 mg total) by mouth every 6 (six) hours as needed for moderate pain. 08/12/15   Eustace Moore, MD  TURMERIC PO Take 1,000 mg by mouth daily.     Historical Provider, MD    Family History Family History  Problem Relation Age of Onset  . Pancreatic cancer Mother   . Prostate cancer Father   . Colon cancer Neg Hx     Social History Social History  Substance Use Topics  . Smoking status: Former Smoker    Packs/day: 1.00    Years: 15.00    Types: Cigars  . Smokeless tobacco: Never Used     Comment: "quit smoking in the late 1980s"  . Alcohol use 21.0 oz/week    14 Shots of liquor, 21 Standard drinks or equivalent per week     Comment: 08/11/2015 whisky 2 drinks a day     Allergies   Aspirin; Bc fast pain [aspirin-caffeine]; and Tape   Review of Systems Review of Systems  All other systems reviewed and are negative.    Physical Exam Updated Vital Signs BP 145/85   Pulse 70   Temp 98.6 F (37 C) (Oral)   Resp 18   SpO2 93%   Physical Exam  Constitutional: He is oriented to person, place, and time. He appears well-developed and well-nourished. No distress.  HENT:  Head: Normocephalic and atraumatic.  Nose: Nose normal.  Mouth/Throat: Oropharynx is clear and moist.  Eyes: Conjunctivae and EOM are normal. Pupils are equal, round, and reactive to light. Right eye exhibits no discharge. Left eye exhibits no discharge.  Neck: Normal range of motion. Neck supple. No JVD present.  Cardiovascular: Normal rate, regular rhythm, normal heart sounds and intact distal pulses.  Exam reveals no gallop and no friction rub.   No murmur heard. Pulses:      Dorsalis pedis pulses are 2+ on the right side,  and 2+ on the left side.       Posterior tibial pulses are 2+ on the right side, and 2+ on the left side.  Pulmonary/Chest: Effort normal and breath sounds normal. No respiratory distress. He has no wheezes. He has no rales. He exhibits no tenderness.  Abdominal: Soft. Bowel sounds are normal. He exhibits no distension and no mass. There is no tenderness. There is no rebound and no guarding.  Musculoskeletal: Normal range of motion.  Neurological: He is alert and oriented to person, place, and time. He displays no atrophy. A sensory deficit is present. No cranial nerve deficit. He exhibits normal muscle tone. Gait abnormal. Coordination  normal.  Decreased sensation to sharp dull Normal dorsiflexion, plantarflexion, able to lift both legs up off bed  Skin: Skin is warm. Capillary refill takes less than 2 seconds. He is not diaphoretic.  Psychiatric: He has a normal mood and affect. Thought content normal.  Nursing note and vitals reviewed.    ED Treatments / Results  Labs (all labs ordered are listed, but only abnormal results are displayed) Labs Reviewed - No data to display  EKG  EKG Interpretation None       Radiology No results found.  Procedures Procedures (including critical care time)  Medications Ordered in ED Medications  HYDROmorphone (DILAUDID) injection 1 mg (not administered)  fentaNYL (SUBLIMAZE) injection 75 mcg (75 mcg Intravenous Given 01/09/16 2138)  diazepam (VALIUM) injection 5 mg (5 mg Intravenous Given 01/09/16 2138)  dexamethasone (DECADRON) injection 10 mg (10 mg Intravenous Given 01/09/16 2142)    Initial Impression / Assessment and Plan / ED Course  I have reviewed the triage vital signs and the nursing notes.  Pertinent labs & imaging results that were available during my care of the patient were reviewed by me and considered in my medical decision making (see chart for details).  Clinical Course   Pt with complicated spinal hx, has worsening pain  and numbness, was told by Neurosurgeon, Dr. Cyndy Freeze to come to the ER. Seen in a shared visit with Dr. Jeneen Rinks, who consulted Dr. Cyndy Freeze, who requested admission and they will see him in the morning and bump up his CT myelogram.  We will admit for pain management and pending neurosurgery eval.  Please see Dr. Drue Stager documentation for further details.  Final Clinical Impressions(s) / ED Diagnoses   Final diagnoses:  Myelopathy [facility])    New Prescriptions New Prescriptions   No medications on file     Laurell Roof 01/09/16 2339    Tanna Furry, MD 01/21/16 1627

## 2016-01-09 NOTE — Progress Notes (Signed)
Arrived from Ed. Alert and oriented. C/o of  lower back pain 7/10. Oriented to room. Family at bedside.

## 2016-01-09 NOTE — ED Provider Notes (Signed)
Patient seen and evaluated. He has a significant history of previous low lumbar fusion. 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 Was done April of this year with Dr. Ronnald Ramp. He recovered incompletely and states he "plateaued" however he regained essentially normal gait. Had intermittent pain. Last week he has had recurrence and progressive pain in his back really to the left leg and now has an abnormal gait with weakness and bilateral anterior thigh pain. He has not been incontinent. He had a bowel movement 2 days ago and urinated today.  On exam he does not have footdrop. He is able to plantarflex and dorsiflex. He can flex and extend the knee and hip although he is limited by pain. Left leg is 5 out of 5 to the Achilles and knee jerk. Right leg is 4 out of 5. He is not spastic he does not have clonus. He has normal rectal tone. On bedside ultrasound is not retaining urine.  Discussed the case with Dr. Maryan Puls. He recommends admission for symptom control. Patient will undergo CT myelogram tomorrow morning.  She has progression of symptoms but does not have symptoms of acute illogical compromise but has progression of symptoms requiring further investigation and symptom control.   Tanna Furry, MD 01/09/16 [phone]

## 2016-01-10 ENCOUNTER — Inpatient Hospital Stay (HOSPITAL_COMMUNITY): Payer: PPO

## 2016-01-10 LAB — CBC WITH DIFFERENTIAL/PLATELET
BASOS ABS: 0 10*3/uL (ref 0.0–0.1)
BASOS PCT: 0 %
Eosinophils Absolute: 0 10*3/uL (ref 0.0–0.7)
Eosinophils Relative: 1 %
HEMATOCRIT: 42.7 % (ref 39.0–52.0)
HEMOGLOBIN: 14.8 g/dL (ref 13.0–17.0)
Lymphocytes Relative: 16 %
Lymphs Abs: 0.9 10*3/uL (ref 0.7–4.0)
MCH: 32.8 pg (ref 26.0–34.0)
MCHC: 34.7 g/dL (ref 30.0–36.0)
MCV: 94.7 fL (ref 78.0–100.0)
MONOS PCT: 1 %
Monocytes Absolute: 0.1 10*3/uL (ref 0.1–1.0)
Neutro Abs: 4.8 10*3/uL (ref 1.7–7.7)
Neutrophils Relative %: 82 %
Platelets: 178 10*3/uL (ref 150–400)
RBC: 4.51 MIL/uL (ref 4.22–5.81)
RDW: 12.4 % (ref 11.5–15.5)
WBC: 5.8 10*3/uL (ref 4.0–10.5)

## 2016-01-10 LAB — CG4 I-STAT (LACTIC ACID): LACTIC ACID, VENOUS: 1.23 mmol/L (ref 0.5–1.9)

## 2016-01-10 LAB — PROTIME-INR
INR: 0.94
Prothrombin Time: 12.6 seconds (ref 11.4–15.2)

## 2016-01-10 LAB — BASIC METABOLIC PANEL
ANION GAP: 7 (ref 5–15)
BUN: 17 mg/dL (ref 6–20)
CHLORIDE: 100 mmol/L — AB (ref 101–111)
CO2: 30 mmol/L (ref 22–32)
Calcium: 9.2 mg/dL (ref 8.9–10.3)
Creatinine, Ser: 0.95 mg/dL (ref 0.61–1.24)
GFR calc non Af Amer: >60 mL/min (ref >60)
Glucose, Bld: 254 mg/dL — ABNORMAL HIGH (ref 65–99)
Potassium: 4.2 mmol/L (ref 3.5–5.1)
Sodium: 137 mmol/L (ref 135–145)

## 2016-01-10 LAB — SEDIMENTATION RATE: SED RATE: 5 mm/h (ref 0–16)

## 2016-01-10 MED ORDER — PREDNISONE 10 MG PO TABS
10.0000 mg | ORAL_TABLET | Freq: Every day | ORAL | Status: DC
  Administered 2016-01-10 – 2016-01-11 (×2): 10 mg via ORAL
  Filled 2016-01-10: qty 1
  Filled 2016-01-10 (×2): qty 2
  Filled 2016-01-10: qty 1

## 2016-01-10 MED ORDER — ATORVASTATIN CALCIUM 10 MG PO TABS
20.0000 mg | ORAL_TABLET | Freq: Every day | ORAL | Status: DC
  Administered 2016-01-10 – 2016-01-12 (×3): 20 mg via ORAL
  Filled 2016-01-10 (×3): qty 2

## 2016-01-10 MED ORDER — GABAPENTIN 300 MG PO CAPS
300.0000 mg | ORAL_CAPSULE | Freq: Three times a day (TID) | ORAL | Status: DC
  Administered 2016-01-10 – 2016-01-12 (×7): 300 mg via ORAL
  Filled 2016-01-10 (×7): qty 1

## 2016-01-10 MED ORDER — IOHEXOL 300 MG/ML  SOLN
10.0000 mL | Freq: Once | INTRAMUSCULAR | Status: AC | PRN
  Administered 2016-01-10: 10 mL via INTRATHECAL

## 2016-01-10 MED ORDER — FERROUS SULFATE 325 (65 FE) MG PO TABS
325.0000 mg | ORAL_TABLET | Freq: Every day | ORAL | Status: DC
  Administered 2016-01-10 – 2016-01-12 (×3): 325 mg via ORAL
  Filled 2016-01-10 (×3): qty 1

## 2016-01-10 MED ORDER — LIDOCAINE HCL 1 % IJ SOLN
INTRAMUSCULAR | Status: AC
  Administered 2016-01-10: 10 mL via INTRAMUSCULAR
  Filled 2016-01-10: qty 10

## 2016-01-10 MED ORDER — LIDOCAINE HCL (PF) 1 % IJ SOLN: 10.0000 mL | Freq: Once | INTRAMUSCULAR | Status: DC

## 2016-01-10 MED ORDER — OMEGA-3-ACID ETHYL ESTERS 1 G PO CAPS
1.0000 g | ORAL_CAPSULE | Freq: Every day | ORAL | Status: DC
  Administered 2016-01-10 – 2016-01-12 (×3): 1 g via ORAL
  Filled 2016-01-10 (×3): qty 1

## 2016-01-10 MED ORDER — HYDROCHLOROTHIAZIDE 12.5 MG PO CAPS
12.5000 mg | ORAL_CAPSULE | Freq: Every day | ORAL | Status: DC
Start: 2016-01-10 — End: 2016-01-12
  Administered 2016-01-10 – 2016-01-12 (×2): 12.5 mg via ORAL
  Filled 2016-01-10 (×3): qty 1

## 2016-01-10 MED ORDER — LISINOPRIL 20 MG PO TABS
40.0000 mg | ORAL_TABLET | Freq: Every evening | ORAL | Status: DC
  Administered 2016-01-10 – 2016-01-11 (×2): 40 mg via ORAL
  Filled 2016-01-10 (×2): qty 2

## 2016-01-10 MED ORDER — DEXAMETHASONE 4 MG PO TABS
4.0000 mg | ORAL_TABLET | Freq: Three times a day (TID) | ORAL | Status: DC
  Administered 2016-01-10 – 2016-01-12 (×5): 4 mg via ORAL
  Filled 2016-01-10 (×5): qty 1

## 2016-01-10 MED ORDER — TRAMADOL HCL 50 MG PO TABS
50.0000 mg | ORAL_TABLET | Freq: Four times a day (QID) | ORAL | Status: DC | PRN
  Administered 2016-01-10 – 2016-01-11 (×5): 50 mg via ORAL
  Filled 2016-01-10 (×6): qty 1

## 2016-01-10 MED ORDER — SODIUM CHLORIDE 0.9 % IV SOLN
500.0000 mL | INTRAVENOUS | Status: DC
  Administered 2016-01-10: 500 mL via INTRAVENOUS

## 2016-01-10 MED ORDER — TRAMADOL HCL 50 MG PO TABS: 50.0000 mg | ORAL_TABLET | Freq: Four times a day (QID) | ORAL | Status: DC | PRN

## 2016-01-10 NOTE — Progress Notes (Signed)
Pt back to room.

## 2016-01-10 NOTE — Progress Notes (Signed)
Inpatient Diabetes Program Recommendations  AACE/ADA: New Consensus Statement on Inpatient Glycemic Control (2015)  Target Ranges:  Prepandial:   less than 140 mg/dL      Peak postprandial:   less than 180 mg/dL (1-2 hours)      Critically ill patients:  140 - 180 mg/dL   Lab Results  Component Value Date   HGBA1C 6.2 07/01/2015    Review of Glycemic Control:  Results for BOSTIN, VITIELLO (MRN [id number]) as of 01/10/2016 13:39  Ref. Range 01/10/2016 00:25  Glucose Latest Ref Range: 65 - 99 mg/dL 254 (H)   Inpatient Diabetes Program Recommendations:    No history of diabetes, however lab glucose elevated.  May consider CBG's tid with meals and HS with Novolog moderate correction while in the hospital.  May also consider checking A1C to determine pre hospitalization glycemic control.  Thanks, Adah Perl, RN, BC-ADM Inpatient Diabetes Coordinator Pager [phone] (8a-5p)

## 2016-01-10 NOTE — Procedures (Signed)
Uncomplicated thoracolumbar myelogram via low lumbar laminectomy defect.  No immediate complications.    JWatts MD

## 2016-01-10 NOTE — Care Management Note (Signed)
Case Management Note  Patient Details  Name: Russell Gonzales MRN: [id number] Date of Birth: 16-Mar-1951  Subjective/Objective:  Pt in with myelopathy. He is from home with his spouse.                   Action/Plan: Continued medical work up. CM following for discharge needs.   Expected Discharge Date:                  Expected Discharge Plan:     In-House Referral:     Discharge planning Services     Post Acute Care Choice:    Choice offered to:     DME Arranged:    DME Agency:     HH Arranged:    HH Agency:     Status of Service:  In process, will continue to follow  If discussed at Long Length of Stay Meetings, dates discussed:    Additional Comments:  Pollie Friar, RN 01/10/2016, 3:48 PM

## 2016-01-10 NOTE — Progress Notes (Signed)
Pt off the floor to test.

## 2016-01-10 NOTE — H&P (Signed)
CC:  Chief Complaint  Patient presents with  . Back Pain  . Leg Pain  . Numbness    HPI: Russell Gonzales is a 65 y.o. male with progressive dysesthesias and weakness in lower extremities.  Began 10-14 days ago but dramatically worsened over the past week to the point that he can no longer ambulate independently.  Is an established patient of Dr. Clarice Pole but underwent urgent thoracic laminectomy several months ago by Dr. Ronnald Ramp due to rapidly progressive thoracic myelopathy.  His symptoms now are similar to his symptoms at that time.  PMH: Past Medical History:  Diagnosis Date  . Arthritis    "severe in my back" (08/11/2015)  . Chronic lower back pain   . GERD (gastroesophageal reflux disease)   . Hemorrhoids   . Hyperlipidemia   . Hypertension   . Seasonal allergies   . Shortness of breath dyspnea    With exertion    PSH: Past Surgical History:  Procedure Laterality Date  . ANKLE FRACTURE SURGERY Left 1972   "it was crushed; has screws and plates"  . ANTERIOR CERVICAL DECOMP/DISCECTOMY FUSION  05/2008  . BACK SURGERY    . COLONOSCOPY W/ BIOPSIES AND POLYPECTOMY    . FRACTURE SURGERY    . KNEE ARTHROSCOPY Left 1970s   "removed cyst"  . LUMBAR LAMINECTOMY/DECOMPRESSION MICRODISCECTOMY N/A 08/11/2015   Procedure: Laminectomy - Thoracic eleven - Thoracic tweleve;  Surgeon: Eustace Moore, MD;  Location: [facility];  Service: Neurosurgery;  Laterality: N/A;  . POLYPECTOMY  2016  . POSTERIOR LUMBAR FUSION  04/2008   "L1-S1; have rods and screws between 4 vertebrae"  . PROSTATE BIOPSY  2012  . THORACIC LAMINECTOMY  08/11/2015   Decompressive thoracic laminectomy, medial facetectomy and foraminotomy T11 and T12    SH: Social History  Substance Use Topics  . Smoking status: Former Smoker    Packs/day: 1.00    Years: 15.00    Types: Cigars  . Smokeless tobacco: Never Used     Comment: "quit smoking in the late 1980s"  . Alcohol use 21.0 oz/week    14 Shots of liquor, 21  Standard drinks or equivalent per week     Comment: 08/11/2015 whisky 2 drinks a day    MEDS: Prior to Admission medications   Medication Sig Start Date End Date Taking? Authorizing Provider  atorvastatin (LIPITOR) 20 MG tablet TAKE ONE TABLET BY MOUTH ONCE DAILY Patient taking differently: TAKE ONE TABLET (20 mg) BY MOUTH every evening 09/15/15  Yes [name], MD  fish oil-omega-3 fatty acids 1000 MG capsule Take 1 g by mouth 2 (two) times daily.     Yes Historical Provider, MD  lisinopril (PRINIVIL,ZESTRIL) 40 MG tablet Take 1 tablet daily Patient taking differently: Take 40 mg by mouth every evening.  03/30/15  Yes [name], MD  Ferrous Sulfate (IRON) 325 (65 FE) MG TABS Take 325 mg by mouth daily.     Historical Provider, MD  hydrochlorothiazide (MICROZIDE) 12.5 MG capsule Take 1 capsule (12.5 mg total) by mouth daily. 03/30/15   [name], MD  ibuprofen (ADVIL,MOTRIN) 400 MG tablet Take 400 mg by mouth 2 (two) times daily as needed for mild pain.     Historical Provider, MD  ketoconazole (NIZORAL) 2 % cream Apply 1 application topically daily as needed for irritation. Reported on 07/01/2015 09/21/10   Historical Provider, MD  Multiple Vitamins-Minerals (MULTIVITAMIN ADULT PO) Take 1 tablet by mouth daily.  Historical Provider, MD  predniSONE (STERAPRED UNI-PAK 21 TAB) 10 MG (21) TBPK tablet Take 10 mg by mouth daily.    Historical Provider, MD  traMADol (ULTRAM) 50 MG tablet Take 1 tablet (50 mg total) by mouth every 6 (six) hours as needed for moderate pain. 08/12/15   Eustace Moore, MD  TURMERIC PO Take 1,000 mg by mouth daily.     Historical Provider, MD    ALLERGY: Allergies  Allergen Reactions  . Aspirin Hives  . Bc Fast Pain [Aspirin-Caffeine] Hives  . Tape Rash    Surgical Tape- 2009ish  Back surgery - broke out- rash    ROS: ROS  Burning pain in thighs.  Dull sensation in lower extremities.  Unable to ambulate.  NEUROLOGIC EXAM: Awake, alert, oriented Memory  and concentration grossly intact Speech fluent, appropriate Motor exam: Upper Extremities Deltoid Bicep Tricep Grip  Right 5/5 5/5 5/5 5/5  Left 5/5 5/5 5/5 5/5   Lower Extremity IP Quad PF DF EHL  Right 5/5 5/5 5/5 5/5 5/5  Left 5/5 5/5 5/5 5/5 5/5   Sensation present but diminished BLE  IMAGING: No new imaging  IMPRESSION: - 65 y.o. male with signs and symptoms of thoracic myelopathy.  PLAN: - Thoracic and lumbar myelogram with post myelogram CT ordered - Neurontin for dysesthesia - NPO for now

## 2016-01-10 NOTE — Progress Notes (Signed)
Called and spoke with Elsner about if pt was to remain NPO. instructed to keep pt NPO at this time.

## 2016-01-10 NOTE — Progress Notes (Signed)
Patient ID: Russell Gonzales, male   DOB: 1950/06/21, 65 y.o.   MRN: [id number] Vital signs are stable Motor function demonstrates some spasticity and weakness graded 4 out of 5 Received some Decadron this morning Myelogram demonstrates that there is not much change from his study in 2016 Nonetheless he does have areas of stenosis from T9-L1 These will ultimately require surgical decompression Not certain though that it is emergent I like to see his reaction to the Decadron Check his progress tomorrow He may have a diet tonight

## 2016-01-10 NOTE — Care Management Important Message (Signed)
Important Message  Patient Details  Name: Russell Gonzales MRN: [id number] Date of Birth: 1950-10-10   Medicare Important Message Given:  Yes    Loann Quill 01/10/2016, 3:32 PM

## 2016-01-11 MED ORDER — BISACODYL 10 MG RE SUPP
10.0000 mg | Freq: Once | RECTAL | Status: AC
  Administered 2016-01-11: 10 mg via RECTAL
  Filled 2016-01-11: qty 1

## 2016-01-11 NOTE — Progress Notes (Signed)
Pt walked estimated 150 with walker, unable to walk without walker, pt reports prior to this back flare up pt able to walk without any assistive device. Pt very spastic in walking movements, but no trips or stumbles while walking.  Pt afraid he will lose his balance if he walks without a walker.   Pt says to not order him a walker, because he has one at home.

## 2016-01-11 NOTE — Progress Notes (Signed)
Pt asking for bed alarm to be turned off, so that he is able to reposition in bed without alarm going off. Pt has not attempted to get out of bed without staff present, uses urinal at bedside. Wearing yellow socks. rn agreed to turn bed alarm off.

## 2016-01-11 NOTE — Progress Notes (Signed)
Patient ID: Russell Gonzales, male   DOB: 1950/10/23, 65 y.o.   MRN: [id number] Vital signs are stable Motor function appears good in lower extremities Patient has not been out of bed yet Wishes to ambulate We'll see if his neurologic status is improved may hold off on surgical intervention

## 2016-01-12 ENCOUNTER — Ambulatory Visit (HOSPITAL_COMMUNITY): Admission: RE | Admit: 2016-01-12 | Payer: PPO | Source: Ambulatory Visit

## 2016-01-12 ENCOUNTER — Other Ambulatory Visit (HOSPITAL_COMMUNITY): Payer: PPO

## 2016-01-12 ENCOUNTER — Other Ambulatory Visit: Payer: Self-pay | Admitting: Neurological Surgery

## 2016-01-12 ENCOUNTER — Ambulatory Visit (HOSPITAL_COMMUNITY): Payer: PPO

## 2016-01-12 MED ORDER — DEXAMETHASONE 4 MG PO TABS: 4.0000 mg | ORAL_TABLET | Freq: Two times a day (BID) | ORAL | 2 refills | Status: DC

## 2016-01-12 MED ORDER — TRAMADOL HCL 50 MG PO TABS: 50.0000 mg | ORAL_TABLET | Freq: Four times a day (QID) | ORAL | 3 refills | Status: DC | PRN

## 2016-01-12 MED ORDER — HYDROCODONE-ACETAMINOPHEN 5-325 MG PO TABS: 1.0000 | ORAL_TABLET | Freq: Four times a day (QID) | ORAL | 0 refills | Status: DC | PRN

## 2016-01-12 NOTE — Care Management Note (Signed)
Case Management Note  Patient Details  Name: Russell Gonzales MRN: [id number] Date of Birth: Oct 31, 1950  Subjective/Objective:                    Action/Plan: Pt discharging home with self care. Per RN notes patient has a walker at home. No further needs per CM.   Expected Discharge Date:                  Expected Discharge Plan:  Home/Self Care  In-House Referral:     Discharge planning Services     Post Acute Care Choice:    Choice offered to:     DME Arranged:    DME Agency:     HH Arranged:    [place] Agency:     Status of Service:  Completed, signed off  If discussed at [employer] of Stay Meetings, dates discussed:    Additional Comments:  Pollie Friar, RN 01/12/2016, 10:23 AM

## 2016-01-12 NOTE — Discharge Summary (Signed)
Physician Discharge Summary  Patient ID: Russell Gonzales MRN: [id number] DOB/AGE: 07-28-1950 65 y.o.  Admit date: 01/09/2016 Discharge date: 01/23/2016  Admission Diagnoses:Thoracic myelopathy, paraparesis  Discharge Diagnoses: Thoracic myelopathy, paraparesis Active Problems:   Myelopathy ([place])   Back pain   Discharged Condition: fair  Hospital Course: Patient was admitted with a several day history of progressive weakness of his lower extremities and significant increase in back pain. The patient has a well-known history of severe degenerative changes in the thoracic and lumbar spines. His previously undergone a decompression and fusion from L2-S2 in his lumbar spine and had thoracic myelopathy documented on myelogram 2016 requiring urgent surgery in November of this past year. He had improved in terms of his lower extremity function but then started to suffer deterioration recently. Repeat myelogram was performed and this demonstrates no significant change in the anatomic nature of his thoracic kyphoscoliosis and degeneration above his L2 to sacrum fusion. He will however ultimately require further surgical decompression and stabilization from T8-L2. This will be planned on a separate admission.  Consults: None  Significant Diagnostic Studies: Myelogram  Treatments: High-dose steroids  Discharge Exam: Blood pressure 136/71, pulse 65, temperature 98.9 F (37.2 C), temperature source Oral, resp. rate 20, height 5\' 6"  (1.676 m), weight 110 kg (242 lb 8.1 oz), SpO2 98 %. Alert and oriented however the patient has marked weakness with spasticity in both lower extremities. He's able to walk with the use of a walker. Proximal leg strength is diminished though to 4 minus out of 5.  Disposition: 01-Home or Self Care  Discharge Instructions    Call MD for:  severe uncontrolled pain    Complete by:  As directed   Call MD for:  temperature >100.4    Complete by:  As directed   Diet - low  sodium heart healthy    Complete by:  As directed   Increase activity slowly    Complete by:  As directed       Medication List    STOP taking these medications   ibuprofen 400 MG tablet Commonly known as:  ADVIL,MOTRIN     TAKE these medications   atorvastatin 20 MG tablet Commonly known as:  LIPITOR TAKE ONE TABLET BY MOUTH ONCE DAILY What changed:  See the new instructions.   dexamethasone 4 MG tablet Commonly known as:  DECADRON Take 1 tablet (4 mg total) by mouth 2 (two) times daily.   fish oil-omega-3 fatty acids 1000 MG capsule Take 1 g by mouth 2 (two) times daily.   hydrochlorothiazide 12.5 MG capsule Commonly known as:  MICROZIDE Take 1 capsule (12.5 mg total) by mouth daily.   HYDROcodone-acetaminophen 5-325 MG tablet Commonly known as:  NORCO Take 1 tablet by mouth every 6 (six) hours as needed for severe pain.   ketoconazole 2 % cream Commonly known as:  NIZORAL Apply 1 application topically daily as needed for irritation. Reported on 07/01/2015   lisinopril 40 MG tablet Commonly known as:  PRINIVIL,ZESTRIL Take 1 tablet daily What changed:  how much to take  how to take this  when to take this  additional instructions   MULTIVITAMIN ADULT PO Take 1 tablet by mouth daily.   traMADol 50 MG tablet Commonly known as:  ULTRAM Take 1 tablet (50 mg total) by mouth every 6 (six) hours as needed for moderate pain.        SignedEarleen Newport 01/23/2016, 6:28 PM

## 2016-01-12 NOTE — Progress Notes (Signed)
Pt discharged per MD order. Transportation home not available until after 1330. Pt aware welcome to stay until ride arrives. Wendee Copp

## 2016-01-12 NOTE — Progress Notes (Signed)
Pt discharged. Discharge information and prescriptions given. IV discontinued. Spouse present to transport patient home. Pt will be leaving unit via wheelchair with volunteer services. Wendee Copp

## 2016-01-17 ENCOUNTER — Encounter (HOSPITAL_COMMUNITY): Payer: Self-pay | Admitting: *Deleted

## 2016-01-17 NOTE — Progress Notes (Signed)
Pt denies cardiac history, chest pain or sob. 

## 2016-01-18 ENCOUNTER — Encounter (HOSPITAL_COMMUNITY)
Admission: RE | Disposition: A | Discharge status: Rehabilitation Facility | Payer: Self-pay | Source: Ambulatory Visit | Attending: Neurological Surgery

## 2016-01-18 ENCOUNTER — Inpatient Hospital Stay (HOSPITAL_COMMUNITY): Payer: PPO

## 2016-01-18 ENCOUNTER — Inpatient Hospital Stay (HOSPITAL_COMMUNITY)
Admission: RE | Admit: 2016-01-18 | Discharge: 2016-01-28 | DRG: 456 | Disposition: A | Discharge status: Rehabilitation Facility | Payer: PPO | Source: Ambulatory Visit | Attending: Neurological Surgery | Admitting: Neurological Surgery

## 2016-01-18 ENCOUNTER — Inpatient Hospital Stay (HOSPITAL_COMMUNITY): Payer: PPO | Admitting: Certified Registered Nurse Anesthetist

## 2016-01-18 ENCOUNTER — Encounter (HOSPITAL_COMMUNITY): Payer: Self-pay | Admitting: *Deleted

## 2016-01-18 DIAGNOSIS — R579 Shock, unspecified: Secondary | ICD-10-CM | POA: Diagnosis not present

## 2016-01-18 DIAGNOSIS — Z6839 Body mass index (BMI) 39.0-39.9, adult: Secondary | ICD-10-CM | POA: Diagnosis not present

## 2016-01-18 DIAGNOSIS — R5381 Other malaise: Secondary | ICD-10-CM | POA: Diagnosis not present

## 2016-01-18 DIAGNOSIS — M4715 Other spondylosis with myelopathy, thoracolumbar region: Secondary | ICD-10-CM | POA: Diagnosis present

## 2016-01-18 DIAGNOSIS — N179 Acute kidney failure, unspecified: Secondary | ICD-10-CM | POA: Diagnosis not present

## 2016-01-18 DIAGNOSIS — E2740 Unspecified adrenocortical insufficiency: Secondary | ICD-10-CM | POA: Diagnosis not present

## 2016-01-18 DIAGNOSIS — R34 Anuria and oliguria: Secondary | ICD-10-CM | POA: Diagnosis not present

## 2016-01-18 DIAGNOSIS — Z01818 Encounter for other preprocedural examination: Secondary | ICD-10-CM | POA: Diagnosis not present

## 2016-01-18 DIAGNOSIS — D62 Acute posthemorrhagic anemia: Secondary | ICD-10-CM | POA: Diagnosis not present

## 2016-01-18 DIAGNOSIS — M4714 Other spondylosis with myelopathy, thoracic region: Secondary | ICD-10-CM | POA: Diagnosis not present

## 2016-01-18 DIAGNOSIS — N170 Acute kidney failure with tubular necrosis: Secondary | ICD-10-CM | POA: Diagnosis not present

## 2016-01-18 DIAGNOSIS — Z8042 Family history of malignant neoplasm of prostate: Secondary | ICD-10-CM | POA: Diagnosis not present

## 2016-01-18 DIAGNOSIS — Z981 Arthrodesis status: Secondary | ICD-10-CM

## 2016-01-18 DIAGNOSIS — K219 Gastro-esophageal reflux disease without esophagitis: Secondary | ICD-10-CM | POA: Diagnosis present

## 2016-01-18 DIAGNOSIS — E785 Hyperlipidemia, unspecified: Secondary | ICD-10-CM | POA: Diagnosis present

## 2016-01-18 DIAGNOSIS — J9690 Respiratory failure, unspecified, unspecified whether with hypoxia or hypercapnia: Secondary | ICD-10-CM

## 2016-01-18 DIAGNOSIS — Z800 Family history of malignant neoplasm of digestive organs: Secondary | ICD-10-CM | POA: Diagnosis not present

## 2016-01-18 DIAGNOSIS — Z7952 Long term (current) use of systemic steroids: Secondary | ICD-10-CM

## 2016-01-18 DIAGNOSIS — M4805 Spinal stenosis, thoracolumbar region: Secondary | ICD-10-CM | POA: Diagnosis present

## 2016-01-18 DIAGNOSIS — J9600 Acute respiratory failure, unspecified whether with hypoxia or hypercapnia: Secondary | ICD-10-CM | POA: Diagnosis not present

## 2016-01-18 DIAGNOSIS — E876 Hypokalemia: Secondary | ICD-10-CM | POA: Diagnosis not present

## 2016-01-18 DIAGNOSIS — Z4659 Encounter for fitting and adjustment of other gastrointestinal appliance and device: Secondary | ICD-10-CM

## 2016-01-18 DIAGNOSIS — E871 Hypo-osmolality and hyponatremia: Secondary | ICD-10-CM | POA: Diagnosis present

## 2016-01-18 DIAGNOSIS — K7200 Acute and subacute hepatic failure without coma: Secondary | ICD-10-CM | POA: Diagnosis not present

## 2016-01-18 DIAGNOSIS — E875 Hyperkalemia: Secondary | ICD-10-CM | POA: Diagnosis present

## 2016-01-18 DIAGNOSIS — A419 Sepsis, unspecified organism: Secondary | ICD-10-CM | POA: Diagnosis not present

## 2016-01-18 DIAGNOSIS — K592 Neurogenic bowel, not elsewhere classified: Secondary | ICD-10-CM | POA: Diagnosis not present

## 2016-01-18 DIAGNOSIS — K760 Fatty (change of) liver, not elsewhere classified: Secondary | ICD-10-CM | POA: Diagnosis present

## 2016-01-18 DIAGNOSIS — M4185 Other forms of scoliosis, thoracolumbar region: Principal | ICD-10-CM | POA: Diagnosis present

## 2016-01-18 DIAGNOSIS — I10 Essential (primary) hypertension: Secondary | ICD-10-CM | POA: Diagnosis present

## 2016-01-18 DIAGNOSIS — T8119XA Other postprocedural shock, initial encounter: Secondary | ICD-10-CM | POA: Diagnosis not present

## 2016-01-18 DIAGNOSIS — R319 Hematuria, unspecified: Secondary | ICD-10-CM | POA: Diagnosis not present

## 2016-01-18 DIAGNOSIS — Z79899 Other long term (current) drug therapy: Secondary | ICD-10-CM

## 2016-01-18 DIAGNOSIS — Z87891 Personal history of nicotine dependence: Secondary | ICD-10-CM

## 2016-01-18 DIAGNOSIS — D696 Thrombocytopenia, unspecified: Secondary | ICD-10-CM | POA: Diagnosis not present

## 2016-01-18 DIAGNOSIS — E872 Acidosis: Secondary | ICD-10-CM | POA: Diagnosis not present

## 2016-01-18 DIAGNOSIS — G8918 Other acute postprocedural pain: Secondary | ICD-10-CM | POA: Diagnosis not present

## 2016-01-18 DIAGNOSIS — G8220 Paraplegia, unspecified: Secondary | ICD-10-CM | POA: Diagnosis not present

## 2016-01-18 DIAGNOSIS — Z452 Encounter for adjustment and management of vascular access device: Secondary | ICD-10-CM

## 2016-01-18 DIAGNOSIS — Y92234 Operating room of hospital as the place of occurrence of the external cause: Secondary | ICD-10-CM | POA: Diagnosis not present

## 2016-01-18 DIAGNOSIS — Z87898 Personal history of other specified conditions: Secondary | ICD-10-CM

## 2016-01-18 DIAGNOSIS — Z419 Encounter for procedure for purposes other than remedying health state, unspecified: Secondary | ICD-10-CM

## 2016-01-18 DIAGNOSIS — K5903 Drug induced constipation: Secondary | ICD-10-CM | POA: Diagnosis not present

## 2016-01-18 DIAGNOSIS — T361X5A Adverse effect of cephalosporins and other beta-lactam antibiotics, initial encounter: Secondary | ICD-10-CM | POA: Diagnosis not present

## 2016-01-18 DIAGNOSIS — E119 Type 2 diabetes mellitus without complications: Secondary | ICD-10-CM | POA: Diagnosis present

## 2016-01-18 DIAGNOSIS — L270 Generalized skin eruption due to drugs and medicaments taken internally: Secondary | ICD-10-CM | POA: Diagnosis not present

## 2016-01-18 DIAGNOSIS — E669 Obesity, unspecified: Secondary | ICD-10-CM | POA: Diagnosis present

## 2016-01-18 DIAGNOSIS — M4804 Spinal stenosis, thoracic region: Secondary | ICD-10-CM | POA: Diagnosis not present

## 2016-01-18 DIAGNOSIS — M549 Dorsalgia, unspecified: Secondary | ICD-10-CM | POA: Diagnosis present

## 2016-01-18 DIAGNOSIS — T40605A Adverse effect of unspecified narcotics, initial encounter: Secondary | ICD-10-CM | POA: Diagnosis not present

## 2016-01-18 HISTORY — DX: Fatty (change of) liver, not elsewhere classified: K76.0

## 2016-01-18 HISTORY — PX: POSTERIOR LUMBAR FUSION 4 LEVEL: SHX6037

## 2016-01-18 HISTORY — DX: Iron deficiency: E61.1

## 2016-01-18 LAB — POCT I-STAT 7, (LYTES, BLD GAS, ICA,H+H)
ACID-BASE DEFICIT: 3 mmol/L — AB (ref 0.0–2.0)
Acid-base deficit: 3 mmol/L — ABNORMAL HIGH (ref 0.0–2.0)
BICARBONATE: 22.2 meq/L (ref 20.0–24.0)
Bicarbonate: 22.1 mEq/L (ref 20.0–24.0)
Calcium, Ion: 1.19 mmol/L (ref 1.12–1.23)
Calcium, Ion: 1.04 mmol/L — ABNORMAL LOW (ref 1.12–1.23)
HCT: 35 % — ABNORMAL LOW (ref 39.0–52.0)
HEMATOCRIT: 38 % — AB (ref 39.0–52.0)
HEMOGLOBIN: 12.9 g/dL — AB (ref 13.0–17.0)
HEMOGLOBIN: 11.9 g/dL — AB (ref 13.0–17.0)
O2 SAT: 100 %
O2 SAT: 100 %
PCO2 ART: 38.6 mmHg (ref 35.0–45.0)
PCO2 ART: 40 mmHg (ref 35.0–45.0)
PH ART: 7.367 (ref 7.350–7.450)
PO2 ART: 191 mmHg — AB (ref 80.0–100.0)
POTASSIUM: 5.5 mmol/L — AB (ref 3.5–5.1)
Potassium: 5.6 mmol/L — ABNORMAL HIGH (ref 3.5–5.1)
Sodium: 132 mmol/L — ABNORMAL LOW (ref 135–145)
Sodium: 130 mmol/L — ABNORMAL LOW (ref 135–145)
TCO2: 23 mmol/L (ref 0–100)
TCO2: 23 mmol/L (ref 0–100)
pH, Arterial: 7.352 (ref 7.350–7.450)
pO2, Arterial: 193 mmHg — ABNORMAL HIGH (ref 80.0–100.0)

## 2016-01-18 LAB — BLOOD GAS, ARTERIAL
Acid-base deficit: 7.3 mmol/L — ABNORMAL HIGH (ref 0.0–2.0)
Bicarbonate: 17.8 mEq/L — ABNORMAL LOW (ref 20.0–24.0)
Drawn by: 460661
FIO2: 0.4
LHR: 12 {breaths}/min
O2 Saturation: 98.2 %
PATIENT TEMPERATURE: 98.6
PEEP/CPAP: 5 cmH2O
PO2 ART: 132 mmHg — AB (ref 80.0–100.0)
TCO2: 18.9 mmol/L (ref 0–100)
VT: 580 mL
pCO2 arterial: 36.7 mmHg (ref 35.0–45.0)
pH, Arterial: 7.306 — ABNORMAL LOW (ref 7.350–7.450)

## 2016-01-18 LAB — POCT I-STAT 4, (NA,K, GLUC, HGB,HCT)
Glucose, Bld: 230 mg/dL — ABNORMAL HIGH (ref 65–99)
HEMATOCRIT: 42 % (ref 39.0–52.0)
HEMOGLOBIN: 14.3 g/dL (ref 13.0–17.0)
Potassium: 5.4 mmol/L — ABNORMAL HIGH (ref 3.5–5.1)
SODIUM: 133 mmol/L — AB (ref 135–145)

## 2016-01-18 LAB — BASIC METABOLIC PANEL
Anion gap: 11 (ref 5–15)
BUN: 27 mg/dL — ABNORMAL HIGH (ref 6–20)
CHLORIDE: 100 mmol/L — AB (ref 101–111)
CO2: 18 mmol/L — AB (ref 22–32)
Calcium: 8 mg/dL — ABNORMAL LOW (ref 8.9–10.3)
Creatinine, Ser: 1.29 mg/dL — ABNORMAL HIGH (ref 0.61–1.24)
GFR calc non Af Amer: 57 mL/min — ABNORMAL LOW (ref >60)
Glucose, Bld: 348 mg/dL — ABNORMAL HIGH (ref 65–99)
POTASSIUM: 5.4 mmol/L — AB (ref 3.5–5.1)
SODIUM: 129 mmol/L — AB (ref 135–145)

## 2016-01-18 LAB — DIC (DISSEMINATED INTRAVASCULAR COAGULATION)PANEL
Fibrinogen: 161 mg/dL — ABNORMAL LOW (ref 210–475)
Platelets: 137 10*3/uL — ABNORMAL LOW (ref 150–400)
Prothrombin Time: 17 seconds — ABNORMAL HIGH (ref 11.4–15.2)
Smear Review: NONE SEEN

## 2016-01-18 LAB — COMPREHENSIVE METABOLIC PANEL
ALK PHOS: 66 U/L (ref 38–126)
ALT: 79 U/L — AB (ref 17–63)
AST: 27 U/L (ref 15–41)
Albumin: 3.7 g/dL (ref 3.5–5.0)
Anion gap: 8 (ref 5–15)
BUN: 22 mg/dL — ABNORMAL HIGH (ref 6–20)
CALCIUM: 9 mg/dL (ref 8.9–10.3)
CO2: 24 mmol/L (ref 22–32)
CREATININE: 0.89 mg/dL (ref 0.61–1.24)
Chloride: 99 mmol/L — ABNORMAL LOW (ref 101–111)
GFR calc non Af Amer: >60 mL/min (ref >60)
GLUCOSE: 151 mg/dL — AB (ref 65–99)
Potassium: 5.3 mmol/L — ABNORMAL HIGH (ref 3.5–5.1)
SODIUM: 131 mmol/L — AB (ref 135–145)
Total Bilirubin: 1.1 mg/dL (ref 0.3–1.2)
Total Protein: 6.4 g/dL — ABNORMAL LOW (ref 6.5–8.1)

## 2016-01-18 LAB — CBC
HEMATOCRIT: 36.3 % — AB (ref 39.0–52.0)
HEMOGLOBIN: 12.4 g/dL — AB (ref 13.0–17.0)
MCH: 32.4 pg (ref 26.0–34.0)
MCHC: 34.2 g/dL (ref 30.0–36.0)
MCV: 94.8 fL (ref 78.0–100.0)
Platelets: 134 10*3/uL — ABNORMAL LOW (ref 150–400)
RBC: 3.83 MIL/uL — AB (ref 4.22–5.81)
RDW: 12.8 % (ref 11.5–15.5)
WBC: 23.3 10*3/uL — ABNORMAL HIGH (ref 4.0–10.5)

## 2016-01-18 LAB — DIC (DISSEMINATED INTRAVASCULAR COAGULATION) PANEL
APTT: 28 s (ref 24–36)
D DIMER QUANT: 13.2 ug{FEU}/mL — AB (ref 0.00–0.50)
INR: 1.37

## 2016-01-18 LAB — SURGICAL PCR SCREEN
MRSA, PCR: NEGATIVE
STAPHYLOCOCCUS AUREUS: NEGATIVE

## 2016-01-18 LAB — MRSA PCR SCREENING: MRSA by PCR: NEGATIVE

## 2016-01-18 LAB — PROTIME-INR
INR: 1.47
PROTHROMBIN TIME: 17.9 s — AB (ref 11.4–15.2)

## 2016-01-18 LAB — PREPARE RBC (CROSSMATCH)

## 2016-01-18 SURGERY — POSTERIOR LUMBAR FUSION 4 LEVEL
Anesthesia: General | Site: Back

## 2016-01-18 MED ORDER — SENNA 8.6 MG PO TABS
1.0000 | ORAL_TABLET | Freq: Two times a day (BID) | ORAL | Status: DC
  Administered 2016-01-19 – 2016-01-28 (×18): 8.6 mg via ORAL
  Filled 2016-01-18 (×17): qty 1

## 2016-01-18 MED ORDER — MUPIROCIN 2 % EX OINT
1.0000 "application " | TOPICAL_OINTMENT | Freq: Once | CUTANEOUS | Status: AC
  Administered 2016-01-18: 1 via TOPICAL

## 2016-01-18 MED ORDER — LACTATED RINGERS IV SOLN
INTRAVENOUS | Status: DC | PRN
  Administered 2016-01-18 (×3): via INTRAVENOUS

## 2016-01-18 MED ORDER — VANCOMYCIN HCL 1000 MG IV SOLR
INTRAVENOUS | Status: DC | PRN
  Administered 2016-01-18: 1000 mg via TOPICAL

## 2016-01-18 MED ORDER — CALCIUM CHLORIDE 10 % IV SOLN
INTRAVENOUS | Status: DC | PRN
  Administered 2016-01-18: 1 g via INTRAVENOUS

## 2016-01-18 MED ORDER — PROPOFOL 500 MG/50ML IV EMUL
INTRAVENOUS | Status: DC | PRN
  Administered 2016-01-18: 50 ug/kg/min via INTRAVENOUS

## 2016-01-18 MED ORDER — CALCIUM CHLORIDE 10 % IV SOLN
INTRAVENOUS | Status: AC
  Filled 2016-01-18: qty 10

## 2016-01-18 MED ORDER — FLEET ENEMA 7-19 GM/118ML RE ENEM
1.0000 | ENEMA | Freq: Once | RECTAL | Status: AC | PRN
  Administered 2016-01-27: 1 via RECTAL
  Filled 2016-01-18: qty 1

## 2016-01-18 MED ORDER — SODIUM CHLORIDE 0.9 % IV SOLN: Freq: Once | INTRAVENOUS | Status: DC

## 2016-01-18 MED ORDER — MORPHINE SULFATE (PF) 2 MG/ML IV SOLN: 1.0000 mg | INTRAVENOUS | Status: DC | PRN

## 2016-01-18 MED ORDER — VANCOMYCIN HCL 1000 MG IV SOLR
INTRAVENOUS | Status: AC
  Filled 2016-01-18: qty 1000

## 2016-01-18 MED ORDER — THROMBIN 5000 UNITS EX SOLR
CUTANEOUS | Status: DC | PRN
  Administered 2016-01-18 (×2): via TOPICAL

## 2016-01-18 MED ORDER — ROCURONIUM BROMIDE 100 MG/10ML IV SOLN
INTRAVENOUS | Status: DC | PRN
  Administered 2016-01-18: 10 mg via INTRAVENOUS
  Administered 2016-01-18: 20 mg via INTRAVENOUS
  Administered 2016-01-18: 50 mg via INTRAVENOUS
  Administered 2016-01-18 (×2): 20 mg via INTRAVENOUS
  Administered 2016-01-18: 10 mg via INTRAVENOUS
  Administered 2016-01-18 (×2): 20 mg via INTRAVENOUS
  Administered 2016-01-18 (×2): 10 mg via INTRAVENOUS
  Administered 2016-01-18: 30 mg via INTRAVENOUS

## 2016-01-18 MED ORDER — FAMOTIDINE IN NACL 20-0.9 MG/50ML-% IV SOLN
20.0000 mg | Freq: Two times a day (BID) | INTRAVENOUS | Status: DC
  Administered 2016-01-18 – 2016-01-20 (×4): 20 mg via INTRAVENOUS
  Filled 2016-01-18 (×4): qty 50

## 2016-01-18 MED ORDER — LIDOCAINE 2% (20 MG/ML) 5 ML SYRINGE
INTRAMUSCULAR | Status: AC
  Filled 2016-01-18: qty 10

## 2016-01-18 MED ORDER — FENTANYL CITRATE (PF) 250 MCG/5ML IJ SOLN
INTRAMUSCULAR | Status: AC
  Filled 2016-01-18: qty 5

## 2016-01-18 MED ORDER — ANTISEPTIC ORAL RINSE SOLUTION (CORINZ)
7.0000 mL | Freq: Four times a day (QID) | OROMUCOSAL | Status: DC
  Administered 2016-01-19 – 2016-01-20 (×6): 7 mL via OROMUCOSAL

## 2016-01-18 MED ORDER — PHENYLEPHRINE HCL 10 MG/ML IJ SOLN
30.0000 ug/min | INTRAVENOUS | Status: DC
  Administered 2016-01-18: 60 ug/min via INTRAVENOUS
  Filled 2016-01-18 (×3): qty 1

## 2016-01-18 MED ORDER — LISINOPRIL 20 MG PO TABS: 40.0000 mg | ORAL_TABLET | Freq: Every evening | ORAL | Status: DC

## 2016-01-18 MED ORDER — METHOCARBAMOL 500 MG PO TABS: 500.0000 mg | ORAL_TABLET | Freq: Four times a day (QID) | ORAL | Status: DC | PRN

## 2016-01-18 MED ORDER — MIDAZOLAM HCL 2 MG/2ML IJ SOLN
INTRAMUSCULAR | Status: AC
  Filled 2016-01-18: qty 2

## 2016-01-18 MED ORDER — ONDANSETRON HCL 4 MG/2ML IJ SOLN
INTRAMUSCULAR | Status: AC
  Filled 2016-01-18: qty 2

## 2016-01-18 MED ORDER — CEFAZOLIN SODIUM-DEXTROSE 2-4 GM/100ML-% IV SOLN
INTRAVENOUS | Status: AC
  Filled 2016-01-18: qty 100

## 2016-01-18 MED ORDER — 0.9 % SODIUM CHLORIDE (POUR BTL) OPTIME
TOPICAL | Status: DC | PRN
  Administered 2016-01-18: 1000 mL

## 2016-01-18 MED ORDER — LIDOCAINE HCL 4 % EX SOLN
CUTANEOUS | Status: DC | PRN
  Administered 2016-01-18: 10 mL via TOPICAL

## 2016-01-18 MED ORDER — EPHEDRINE SULFATE 50 MG/ML IJ SOLN
INTRAMUSCULAR | Status: DC | PRN
  Administered 2016-01-18: 10 mg via INTRAVENOUS
  Administered 2016-01-18: 5 mg via INTRAVENOUS

## 2016-01-18 MED ORDER — BISACODYL 10 MG RE SUPP
10.0000 mg | Freq: Every day | RECTAL | Status: DC | PRN
  Administered 2016-01-27: 10 mg via RECTAL
  Filled 2016-01-18: qty 1

## 2016-01-18 MED ORDER — DOCUSATE SODIUM 100 MG PO CAPS: 100.0000 mg | ORAL_CAPSULE | Freq: Two times a day (BID) | ORAL | Status: DC

## 2016-01-18 MED ORDER — CHLORHEXIDINE GLUCONATE CLOTH 2 % EX PADS: 6.0000 | MEDICATED_PAD | Freq: Once | CUTANEOUS | Status: DC

## 2016-01-18 MED ORDER — MENTHOL 3 MG MT LOZG: 1.0000 | LOZENGE | OROMUCOSAL | Status: DC | PRN

## 2016-01-18 MED ORDER — ALBUMIN HUMAN 5 % IV SOLN
INTRAVENOUS | Status: DC | PRN
  Administered 2016-01-18 (×3): via INTRAVENOUS

## 2016-01-18 MED ORDER — LACTATED RINGERS IV SOLN
INTRAVENOUS | Status: DC | PRN
  Administered 2016-01-18: 12:00:00 via INTRAVENOUS

## 2016-01-18 MED ORDER — ALUM & MAG HYDROXIDE-SIMETH 200-200-20 MG/5ML PO SUSP
30.0000 mL | Freq: Four times a day (QID) | ORAL | Status: DC | PRN
  Administered 2016-01-21 (×2): 30 mL via ORAL
  Filled 2016-01-18 (×2): qty 30

## 2016-01-18 MED ORDER — PHENYLEPHRINE HCL 10 MG/ML IJ SOLN: INTRAVENOUS | Status: DC | PRN

## 2016-01-18 MED ORDER — LIDOCAINE HCL (CARDIAC) 20 MG/ML IV SOLN
INTRAVENOUS | Status: DC | PRN
  Administered 2016-01-18: 100 mg via INTRAVENOUS

## 2016-01-18 MED ORDER — ACETAMINOPHEN 650 MG RE SUPP: 650.0000 mg | RECTAL | Status: DC | PRN

## 2016-01-18 MED ORDER — PROPOFOL 10 MG/ML IV BOLUS
INTRAVENOUS | Status: DC | PRN
  Administered 2016-01-18: 200 mg via INTRAVENOUS

## 2016-01-18 MED ORDER — SODIUM CHLORIDE 0.9% FLUSH
3.0000 mL | Freq: Two times a day (BID) | INTRAVENOUS | Status: DC
  Administered 2016-01-19 (×3): 3 mL via INTRAVENOUS

## 2016-01-18 MED ORDER — ONDANSETRON HCL 4 MG/2ML IJ SOLN: 4.0000 mg | Freq: Four times a day (QID) | INTRAMUSCULAR | Status: DC | PRN

## 2016-01-18 MED ORDER — FENTANYL CITRATE (PF) 100 MCG/2ML IJ SOLN
100.0000 ug | INTRAMUSCULAR | Status: DC | PRN
  Administered 2016-01-19 – 2016-01-20 (×9): 100 ug via INTRAVENOUS
  Administered 2016-01-21: 50 ug via INTRAVENOUS
  Administered 2016-01-21 (×2): 100 ug via INTRAVENOUS
  Administered 2016-01-21: 50 ug via INTRAVENOUS
  Administered 2016-01-22: 100 ug via INTRAVENOUS
  Filled 2016-01-18 (×14): qty 2

## 2016-01-18 MED ORDER — SUGAMMADEX SODIUM 500 MG/5ML IV SOLN
INTRAVENOUS | Status: AC
  Filled 2016-01-18: qty 5

## 2016-01-18 MED ORDER — PHENYLEPHRINE 40 MCG/ML (10ML) SYRINGE FOR IV PUSH (FOR BLOOD PRESSURE SUPPORT)
PREFILLED_SYRINGE | INTRAVENOUS | Status: AC
  Filled 2016-01-18: qty 10

## 2016-01-18 MED ORDER — ACETAMINOPHEN 325 MG PO TABS: 650.0000 mg | ORAL_TABLET | ORAL | Status: DC | PRN

## 2016-01-18 MED ORDER — FENTANYL CITRATE (PF) 100 MCG/2ML IJ SOLN
INTRAMUSCULAR | Status: DC | PRN
  Administered 2016-01-18 (×2): 50 ug via INTRAVENOUS
  Administered 2016-01-18: 150 ug via INTRAVENOUS
  Administered 2016-01-18 (×3): 50 ug via INTRAVENOUS

## 2016-01-18 MED ORDER — ONDANSETRON HCL 4 MG/2ML IJ SOLN: 4.0000 mg | Freq: Once | INTRAMUSCULAR | Status: DC | PRN

## 2016-01-18 MED ORDER — ATORVASTATIN CALCIUM 20 MG PO TABS: 20.0000 mg | ORAL_TABLET | Freq: Every day | ORAL | Status: DC

## 2016-01-18 MED ORDER — SODIUM CHLORIDE 0.9 % IV SOLN
INTRAVENOUS | Status: DC
  Administered 2016-01-19 (×2): via INTRAVENOUS
  Administered 2016-01-19 – 2016-01-20 (×2): 125 mL/h via INTRAVENOUS

## 2016-01-18 MED ORDER — DIPHENHYDRAMINE HCL 50 MG/ML IJ SOLN
25.0000 mg | Freq: Four times a day (QID) | INTRAMUSCULAR | Status: DC | PRN
  Administered 2016-01-18: 25 mg via INTRAVENOUS
  Filled 2016-01-18: qty 1

## 2016-01-18 MED ORDER — PHENYLEPHRINE HCL 10 MG/ML IJ SOLN
INTRAVENOUS | Status: DC | PRN
  Administered 2016-01-18: 15 ug/min via INTRAVENOUS

## 2016-01-18 MED ORDER — DEXAMETHASONE SODIUM PHOSPHATE 10 MG/ML IJ SOLN
INTRAMUSCULAR | Status: DC | PRN
  Administered 2016-01-18: 10 mg via INTRAVENOUS

## 2016-01-18 MED ORDER — SUGAMMADEX SODIUM 200 MG/2ML IV SOLN
INTRAVENOUS | Status: AC
  Filled 2016-01-18: qty 2

## 2016-01-18 MED ORDER — ROCURONIUM BROMIDE 50 MG/5ML IV SOLN
INTRAVENOUS | Status: AC
  Filled 2016-01-18: qty 2

## 2016-01-18 MED ORDER — SODIUM CHLORIDE 0.9% FLUSH: 3.0000 mL | INTRAVENOUS | Status: DC | PRN

## 2016-01-18 MED ORDER — VANCOMYCIN HCL IN DEXTROSE 1-5 GM/200ML-% IV SOLN
1000.0000 mg | Freq: Two times a day (BID) | INTRAVENOUS | Status: DC
  Administered 2016-01-19 – 2016-01-20 (×3): 1000 mg via INTRAVENOUS
  Filled 2016-01-18 (×4): qty 200

## 2016-01-18 MED ORDER — DIAZEPAM 5 MG PO TABS: 10.0000 mg | ORAL_TABLET | Freq: Once | ORAL | Status: DC

## 2016-01-18 MED ORDER — ROCURONIUM BROMIDE 50 MG/5ML IV SOLN
INTRAVENOUS | Status: AC
  Filled 2016-01-18: qty 1

## 2016-01-18 MED ORDER — LACTATED RINGERS IV SOLN
INTRAVENOUS | Status: DC
  Administered 2016-01-18: 10:00:00 via INTRAVENOUS

## 2016-01-18 MED ORDER — SODIUM CHLORIDE 0.9 % IR SOLN
Status: DC | PRN
  Administered 2016-01-18: 12:00:00

## 2016-01-18 MED ORDER — EPHEDRINE 5 MG/ML INJ
INTRAVENOUS | Status: AC
  Filled 2016-01-18: qty 10

## 2016-01-18 MED ORDER — CEFAZOLIN SODIUM 1 G IJ SOLR
INTRAMUSCULAR | Status: AC
  Filled 2016-01-18: qty 20

## 2016-01-18 MED ORDER — PHENYLEPHRINE HCL 10 MG/ML IJ SOLN
INTRAMUSCULAR | Status: DC | PRN
  Administered 2016-01-18 (×8): 80 ug via INTRAVENOUS

## 2016-01-18 MED ORDER — SODIUM CHLORIDE 0.9 % IV SOLN
250.0000 mL | INTRAVENOUS | Status: DC
Start: 2016-01-18 — End: 2016-01-20

## 2016-01-18 MED ORDER — ONDANSETRON HCL 4 MG/2ML IJ SOLN
4.0000 mg | INTRAMUSCULAR | Status: DC | PRN
  Administered 2016-01-22: 4 mg via INTRAVENOUS
  Filled 2016-01-18: qty 2

## 2016-01-18 MED ORDER — BUPIVACAINE HCL (PF) 0.5 % IJ SOLN
INTRAMUSCULAR | Status: DC | PRN
  Administered 2016-01-18: 10 mL

## 2016-01-18 MED ORDER — SODIUM CHLORIDE 0.9 % IV SOLN
25.0000 ug/h | INTRAVENOUS | Status: DC
  Administered 2016-01-18: 50 ug/h via INTRAVENOUS
  Administered 2016-01-19: 125 ug/h via INTRAVENOUS
  Filled 2016-01-18 (×2): qty 50

## 2016-01-18 MED ORDER — CEFAZOLIN IN D5W 1 GM/50ML IV SOLN
1.0000 g | Freq: Three times a day (TID) | INTRAVENOUS | Status: DC
  Filled 2016-01-18 (×2): qty 50

## 2016-01-18 MED ORDER — HYDROCHLOROTHIAZIDE 12.5 MG PO CAPS: 12.5000 mg | ORAL_CAPSULE | Freq: Every day | ORAL | Status: DC

## 2016-01-18 MED ORDER — CHLORHEXIDINE GLUCONATE 0.12% ORAL RINSE (MEDLINE KIT)
15.0000 mL | Freq: Two times a day (BID) | OROMUCOSAL | Status: DC
  Administered 2016-01-18 – 2016-01-19 (×3): 15 mL via OROMUCOSAL

## 2016-01-18 MED ORDER — FENTANYL CITRATE (PF) 100 MCG/2ML IJ SOLN
100.0000 ug | INTRAMUSCULAR | Status: DC | PRN
  Administered 2016-01-18: 100 ug via INTRAVENOUS

## 2016-01-18 MED ORDER — SURGIFOAM 100 EX MISC
CUTANEOUS | Status: DC | PRN
  Administered 2016-01-18: 12:00:00 via TOPICAL

## 2016-01-18 MED ORDER — HYDROMORPHONE HCL 1 MG/ML IJ SOLN: 0.5000 mg | INTRAMUSCULAR | Status: DC | PRN

## 2016-01-18 MED ORDER — METHOCARBAMOL 1000 MG/10ML IJ SOLN
500.0000 mg | Freq: Four times a day (QID) | INTRAVENOUS | Status: DC | PRN
  Filled 2016-01-18: qty 5

## 2016-01-18 MED ORDER — CEFAZOLIN SODIUM-DEXTROSE 2-4 GM/100ML-% IV SOLN
2.0000 g | INTRAVENOUS | Status: AC
  Administered 2016-01-18 (×2): 2 g via INTRAVENOUS

## 2016-01-18 MED ORDER — PHENOL 1.4 % MT LIQD: 1.0000 | OROMUCOSAL | Status: DC | PRN

## 2016-01-18 MED ORDER — OXYCODONE-ACETAMINOPHEN 5-325 MG PO TABS: 1.0000 | ORAL_TABLET | ORAL | Status: DC | PRN

## 2016-01-18 MED ORDER — MUPIROCIN 2 % EX OINT
TOPICAL_OINTMENT | CUTANEOUS | Status: AC
  Administered 2016-01-18: 1 via TOPICAL
  Filled 2016-01-18: qty 22

## 2016-01-18 MED ORDER — POLYETHYLENE GLYCOL 3350 17 G PO PACK
17.0000 g | PACK | Freq: Every day | ORAL | Status: DC | PRN
  Administered 2016-01-24: 17 g via ORAL
  Filled 2016-01-18: qty 1

## 2016-01-18 MED ORDER — PROPOFOL 10 MG/ML IV BOLUS
INTRAVENOUS | Status: AC
  Filled 2016-01-18: qty 20

## 2016-01-18 MED ORDER — LIDOCAINE-EPINEPHRINE 1 %-1:100000 IJ SOLN
INTRAMUSCULAR | Status: DC | PRN
  Administered 2016-01-18: 10 mL

## 2016-01-18 MED ORDER — PROPOFOL 1000 MG/100ML IV EMUL
0.0000 ug/kg/min | INTRAVENOUS | Status: DC
  Administered 2016-01-18: 40 ug/kg/min via INTRAVENOUS
  Filled 2016-01-18: qty 100

## 2016-01-18 SURGICAL SUPPLY — 77 items
ADH SKN CLS APL DERMABOND .7 (GAUZE/BANDAGES/DRESSINGS) ×1
APL SRG 60D 8 XTD TIP BNDBL (TIP)
BAG DECANTER FOR FLEXI CONT (MISCELLANEOUS) ×3 IMPLANT
BLADE CLIPPER SURG (BLADE) IMPLANT
BUR MATCHSTICK NEURO 3.0 LAGG (BURR) ×5 IMPLANT
CANISTER SUCT 3000ML PPV (MISCELLANEOUS) ×3 IMPLANT
CONN RELINE 5-6/5-6M 2H INLINE (Connector) ×6 IMPLANT
CONNECTOR RELN 5-6/5-6M 2 HL (Connector) ×2 IMPLANT
CONT SPEC 4OZ CLIKSEAL STRL BL (MISCELLANEOUS) ×3 IMPLANT
COVER BACK TABLE 60X90IN (DRAPES) ×3 IMPLANT
DECANTER SPIKE VIAL GLASS SM (MISCELLANEOUS) ×3 IMPLANT
DERMABOND ADVANCED (GAUZE/BANDAGES/DRESSINGS) ×2
DERMABOND ADVANCED .7 DNX12 (GAUZE/BANDAGES/DRESSINGS) ×1 IMPLANT
DEVICE DISSECT PLASMABLAD 3.0S (MISCELLANEOUS) ×1 IMPLANT
DRAPE C-ARM 42X72 X-RAY (DRAPES) ×8 IMPLANT
DRAPE C-ARMOR (DRAPES) ×2 IMPLANT
DRAPE HALF SHEET 40X57 (DRAPES) ×4 IMPLANT
DRAPE LAPAROTOMY 100X72X124 (DRAPES) ×3 IMPLANT
DRAPE POUCH INSTRU U-SHP 10X18 (DRAPES) ×3 IMPLANT
DRSG OPSITE 4X5.5 SM (GAUZE/BANDAGES/DRESSINGS) ×2 IMPLANT
DRSG OPSITE POSTOP 4X10 (GAUZE/BANDAGES/DRESSINGS) ×2 IMPLANT
DURAPREP 26ML APPLICATOR (WOUND CARE) ×3 IMPLANT
DURASEAL APPLICATOR TIP (TIP) IMPLANT
DURASEAL SPINE SEALANT 3ML (MISCELLANEOUS) IMPLANT
ELECT REM PT RETURN 9FT ADLT (ELECTROSURGICAL) ×3
ELECTRODE REM PT RTRN 9FT ADLT (ELECTROSURGICAL) ×1 IMPLANT
EVACUATOR 3/16  PVC DRAIN (DRAIN) ×2
EVACUATOR 3/16 PVC DRAIN (DRAIN) IMPLANT
GAUZE SPONGE 4X4 12PLY STRL (GAUZE/BANDAGES/DRESSINGS) ×3 IMPLANT
GAUZE SPONGE 4X4 16PLY XRAY LF (GAUZE/BANDAGES/DRESSINGS) IMPLANT
GLOVE BIO SURGEON STRL SZ 6.5 (GLOVE) ×3 IMPLANT
GLOVE BIO SURGEONS STRL SZ 6.5 (GLOVE) ×3
GLOVE BIOGEL PI IND STRL 7.0 (GLOVE) IMPLANT
GLOVE BIOGEL PI IND STRL 8.5 (GLOVE) ×2 IMPLANT
GLOVE BIOGEL PI INDICATOR 7.0 (GLOVE) ×2
GLOVE BIOGEL PI INDICATOR 8.5 (GLOVE) ×4
GLOVE ECLIPSE 7.5 STRL STRAW (GLOVE) ×6 IMPLANT
GLOVE ECLIPSE 8.5 STRL (GLOVE) ×12 IMPLANT
GLOVE INDICATOR 7.0 STRL GRN (GLOVE) ×3 IMPLANT
GLOVE INDICATOR 7.5 STRL GRN (GLOVE) ×6 IMPLANT
GLOVE SURG SS PI 6.5 STRL IVOR (GLOVE) ×9 IMPLANT
GLOVE SURG SS PI 7.0 STRL IVOR (GLOVE) ×2 IMPLANT
GOWN STRL REUS W/ TWL LRG LVL3 (GOWN DISPOSABLE) IMPLANT
GOWN STRL REUS W/ TWL XL LVL3 (GOWN DISPOSABLE) IMPLANT
GOWN STRL REUS W/TWL 2XL LVL3 (GOWN DISPOSABLE) ×6 IMPLANT
GOWN STRL REUS W/TWL LRG LVL3 (GOWN DISPOSABLE) ×6
GOWN STRL REUS W/TWL XL LVL3 (GOWN DISPOSABLE)
HEMOSTAT POWDER KIT SURGIFOAM (HEMOSTASIS) ×4 IMPLANT
KIT BASIN OR (CUSTOM PROCEDURE TRAY) ×3 IMPLANT
KIT INFUSE LRG II (Orthopedic Implant) ×2 IMPLANT
KIT ROOM TURNOVER OR (KITS) ×3 IMPLANT
NEEDLE HYPO 22GX1.5 SAFETY (NEEDLE) ×3 IMPLANT
NS IRRIG 1000ML POUR BTL (IV SOLUTION) ×3 IMPLANT
PACK LAMINECTOMY NEURO (CUSTOM PROCEDURE TRAY) ×3 IMPLANT
PAD ARMBOARD 7.5X6 YLW CONV (MISCELLANEOUS) ×9 IMPLANT
PATTIES SURGICAL .5 X1 (DISPOSABLE) ×3 IMPLANT
PATTIES SURGICAL .5X1.5 (GAUZE/BANDAGES/DRESSINGS) ×4 IMPLANT
PLASMABLADE 3.0S (MISCELLANEOUS) ×3
ROD RELINE-O 5.5X300 STRT NS (Rod) IMPLANT
ROD RELINE-O 5.5X300MM STRT (Rod) ×6 IMPLANT
SCREW LOCK RELINE 5.5 TULIP (Screw) ×14 IMPLANT
SCREW RELINE-O 6.5X55 POLY (Screw) ×3 IMPLANT
SCREW RELINE-O POLY 6.5X45 (Screw) ×12 IMPLANT
SPONGE LAP 4X18 X RAY DECT (DISPOSABLE) IMPLANT
SPONGE SURGIFOAM ABS GEL 100 (HEMOSTASIS) ×3 IMPLANT
STAPLER VISISTAT 35W (STAPLE) ×2 IMPLANT
SUT PROLENE 6 0 BV (SUTURE) IMPLANT
SUT VIC AB 1 CT1 18XBRD ANBCTR (SUTURE) ×1 IMPLANT
SUT VIC AB 1 CT1 8-18 (SUTURE) ×6
SUT VIC AB 2-0 CP2 18 (SUTURE) ×5 IMPLANT
SUT VIC AB 3-0 SH 8-18 (SUTURE) ×5 IMPLANT
SYR 3ML LL SCALE MARK (SYRINGE) ×4 IMPLANT
TOWEL OR 17X24 6PK STRL BLUE (TOWEL DISPOSABLE) ×3 IMPLANT
TOWEL OR 17X26 10 PK STRL BLUE (TOWEL DISPOSABLE) ×3 IMPLANT
TRAP SPECIMEN MUCOUS 40CC (MISCELLANEOUS) ×3 IMPLANT
TRAY FOLEY W/METER SILVER 16FR (SET/KITS/TRAYS/PACK) ×3 IMPLANT
WATER STERILE IRR 1000ML POUR (IV SOLUTION) ×3 IMPLANT

## 2016-01-18 NOTE — Op Note (Signed)
Date of surgery: 01/18/2016 Preoperative diagnosis: Thoracic myelopathy with paraparesis T10-L1 secondary to degenerative scoliosis Post operative diagnosis same Procedure: Decompressive laminectomies from T10-L1, pedicle screw fixation T7 T8 T9 and left T10 with rods stabilization from T7-L2. Arthrodesis with autograft and infuse.  Surgeon: [name] First assistant: Cyndy Freeze M.D. Anesthesia: Gen. endotracheal Indications: 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 This improved his functional status is he is having difficulty ambulating at the time he was found to have significant myelopathy and developed further difficulties with ambulation this was evaluated myelographically and it was noted that he had developed a degenerative scoliosis across the thoracic lumbar spine at T11-T12 he had had significant stenosis he developed an acute deterioration which necessitated emergent surgery this was done in March of this year he made a partial recovery may is still having significant weakness that seemed to get worse in the past week is started on steroid medication and this improved both his functional status and his pain control however it was discussed at that time that you need a major decompression across the thoracic lumbar spine with stabilization from at least the level of T8 down to the previous fusion from L2. He is now taken to the operating room to undergo this procedure.  Procedure: The patient was brought to the operating room supine on a stretcher. After the smooth induction of general endotracheal anesthesia, he was turned prone. The back was prepped with alcohol DuraPrep and draped in a sterile fashion. The area of the old incisions were identified and these were mapped out. The surgery  started by reopening the previous dissection performed at the T11-T12 level. This was then taken down to the previous hardware which identified the rods at L2. I carefully dissecting we entered the laminectomy site is previously performed but laminectomy needed substantial widening and this was done in a very meticulous manner at each individual vertebrae dissecting out fully laterally to expose the entirety of the common dural tube. The dissection was performed using a 2 and 3 mm Kerrison punch in addition to nerve hooks with loupe magnification to allow careful dissection and protection of the dura. Ultimately the area was dissected from T10 down to L1 with a wide laminectomy. No dural incursions had occurred. There were areas of dense scar adhesion from the previous surgeries and had been done. Then the dissection was carried over the laminar arches of T9 and T8 and even up to T7. Fluoroscopic guidance was then used to place pedicle screws which measured 6.5 x 45 mm screws. These were placed in each pedicle save for the left T9 pedicle which required a 55 mm screw. Then rods were contoured to fit between the screw heads to form a neutral construct. Lateral gutters were then packed with infuse in addition to autologous bone graft that was harvested from the laminectomies itself. Blood loss during this procedure was significant totaling 4700 mL. Patient did seem to lose excessively at times. He is being returned to the neurosurgical intensive care unit for further postoperative monitoring.

## 2016-01-18 NOTE — Anesthesia Procedure Notes (Signed)
Procedure Name: Intubation Date/Time: 01/18/2016 12:22 PM Performed by: Tressia Miners LEFFEW Pre-anesthesia Checklist: Patient identified, Patient being monitored, Timeout performed, Emergency Drugs available and Suction available Patient Re-evaluated:Patient Re-evaluated prior to inductionOxygen Delivery Method: Circle System Utilized Preoxygenation: Pre-oxygenation with 100% oxygen Intubation Type: IV induction Ventilation: Mask ventilation without difficulty and Oral airway inserted - appropriate to patient size Laryngoscope Size: Mac and 3 Grade View: Grade II Tube type: Oral Tube size: 7.5 mm Number of attempts: 1 Airway Equipment and Method: Stylet Placement Confirmation: ETT inserted through vocal cords under direct vision,  positive ETCO2 and breath sounds checked- equal and bilateral Secured at: 23 cm Tube secured with: Tape Dental Injury: Teeth and Oropharynx as per pre-operative assessment

## 2016-01-18 NOTE — Consult Note (Signed)
PULMONARY / CRITICAL CARE MEDICINE   Name: Russell Gonzales MRN: [id number] DOB: 05-04-1951    ADMISSION DATE:  01/18/2016 CONSULTATION DATE:  8/8  REFERRING MD:  Dr. Ellene Route  CHIEF COMPLAINT:  Back surgery  HISTORY OF PRESENT ILLNESS:  65 year old male with past medical history as below, which is significant for chronic back pain, fatty liver disease, hypertension, hyperlipidemia, and GERD. 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 He presented again the end of July 2017 with complaints of leg pain and numbness. He underwent repeat myelography which demonstrated advanced spondylitic stenosis at multiple levels across the thoracic-lumbar junction and he has been recommended for surgical decompression and stabilization from T8 down to L2. 01/18/2016 he presented for elective procedure which was without complication. Postoperatively he was sent to the ICU for recovery and PCCM has been asked to evaluate.  PAST MEDICAL HISTORY :  He  has a past medical history of Arthritis; Chronic lower back pain; Fatty liver; GERD (gastroesophageal reflux disease); Hemorrhoids; Hyperlipidemia; Hypertension; Low iron; Seasonal allergies; and Shortness of breath dyspnea.  PAST SURGICAL HISTORY: He  has a past surgical history that includes Anterior cervical decomp/discectomy fusion (05/2008); Back surgery; Colonoscopy w/ biopsies and polypectomy; Polypectomy (2016); ostate biopsy (2012); Ankle fracture surgery (Left, 1972); Fracture surgery; Knee arthroscopy (Left, 1970s); Posterior lumbar fusion (04/2008); Thoracic laminectomy (08/11/2015); and Lumbar laminectomy/decompression microdiscectomy (N/A, 08/11/2015).  Allergies  Allergen Reactions  . Aspirin Hives  . Bc Fast Pain [Aspirin-Caffeine] Hives  . Tape Rash    Surgical Tape- 2009ish  Back surgery - broke out- rash    No current  facility-administered medications on file prior to encounter.    Current Outpatient Prescriptions on File Prior to Encounter  Medication Sig  . atorvastatin (LIPITOR) 20 MG tablet TAKE ONE TABLET BY MOUTH ONCE DAILY (Patient taking differently: TAKE ONE TABLET (20 mg) BY MOUTH every evening)  . dexamethasone (DECADRON) 4 MG tablet Take 1 tablet (4 mg total) by mouth 2 (two) times daily.  Marland Kitchen lisinopril (PRINIVIL,ZESTRIL) 40 MG tablet Take 1 tablet daily (Patient taking differently: Take 40 mg by mouth every evening. )  . Multiple Vitamins-Minerals (MULTIVITAMIN ADULT PO) Take 1 tablet by mouth daily.   . traMADol (ULTRAM) 50 MG tablet Take 1 tablet (50 mg total) by mouth every 6 (six) hours as needed for moderate pain.  . fish oil-omega-3 fatty acids 1000 MG capsule Take 1 g by mouth 2 (two) times daily.    . hydrochlorothiazide (MICROZIDE) 12.5 MG capsule Take 1 capsule (12.5 mg total) by mouth daily.  Marland Kitchen HYDROcodone-acetaminophen (NORCO) 5-325 MG tablet Take 1 tablet by mouth every 6 (six) hours as needed for severe pain.  Marland Kitchen ketoconazole (NIZORAL) 2 % cream Apply 1 application topically daily as needed for irritation. Reported on 07/01/2015    FAMILY HISTORY:  His indicated that his mother is deceased. He indicated that his father is deceased. He indicated that his brother is deceased. He indicated that the status of his neg hx is unknown.    SOCIAL HISTORY: He  reports that he has quit smoking. His smoking use included Cigars. He has a 15.00 pack-year smoking history. He has never used smokeless tobacco. He reports that he drinks about 21.0 oz of alcohol per week . He reports that he uses drugs, including Other-see comments.   REVIEW OF SYSTEMS:   unable  SUBJECTIVE:    VITAL  SIGNS: BP 139/84   Pulse 90   Temp 98.3 F (36.8 C) (Oral)   Resp 12   SpO2 98%   HEMODYNAMICS:    VENTILATOR SETTINGS:    INTAKE / OUTPUT: I/O last 3 completed shifts: In: 6868 [I.V.:4000;  [id number]; IV [id number] Out: [id number] [Urine:1560; Blood:4100]  PHYSICAL EXAMINATION: General:  Obese male in NAD on vent Neuro:  Sedated HEENT:  [place]/AT, PERRL, no JVD Cardiovascular: RRR, no MRG Lungs:  Clear Abdomen:  Soft, non-distended Musculoskeletal:  No acute deformity Skin:  Grossly intact. No pallor  LABS:  BMET  Recent Labs Lab 01/18/16 0943  NA 131*  K 5.3*  CL 99*  CO2 24  BUN 22*  CREATININE 0.89  GLUCOSE 151*    Electrolytes  Recent Labs Lab 01/18/16 0943  CALCIUM 9.0    CBC No results for input(s): WBC, HGB, HCT, PLT in the last 168 hours.  Coag's No results for input(s): APTT, INR in the last 168 hours.  Sepsis Markers No results for input(s): LATICACIDVEN, PROCALCITON, O2SATVEN in the last 168 hours.  ABG No results for input(s): PHART, PCO2ART, PO2ART in the last 168 hours.  Liver Enzymes  Recent Labs Lab 01/18/16 0943  AST 27  ALT 79*  ALKPHOS 66  BILITOT 1.1  ALBUMIN 3.7    Cardiac Enzymes No results for input(s): TROPONINI, PROBNP in the last 168 hours.  Glucose No results for input(s): GLUCAP in the last 168 hours.  Imaging Dg Thoracolumabar Spine  Result Date: 01/18/2016 CLINICAL DATA:  T7-L2 laminectomy with fixation. EXAM: OPERATIVE THORACIC SPINE 4 VIEW(S) COMPARISON:  CT myelogram 11/01/2007. FINDINGS: 4 intraoperative spot fluoro images are submitted. Small field-of-view makes level counting difficult. But no ribs are seen on the vertebral body that is at the level of the rod connection devices. This would be compatible with L1. This would put the cranial most pedicle screws at the T7 level, as indicated in the clinical data. Two screws are seen at T7, T8, and T9 with unilateral screws seen at the T10 level. Additional screws bilaterally in the upper lumbar spine, but inferior extent of the fusion hardware has not been included on the spot fluoro films. IMPRESSION: Intraoperative assessment during thoracolumbar fusion.  Electronically Signed   By: Misty Stanley M.D.   On: 01/18/2016 19:01   Dg Thoracic Spine 1 View  Result Date: 01/18/2016 CLINICAL DATA:  Localization film for T7-L2 laminectomy/fixation. EXAM: OPERATIVE THORACIC SPINE 1 VIEW(S) COMPARISON:  CT 01/10/2016 FINDINGS: For consistency, numbering for today's exam is based on most recent CT myelogram performed on 01/10/2016. This study describes a chronic compression at L4. Single lateral view is performed, demonstrating a posterior retractor is in place. Patient has had remote posterior fusion involving L2-3 and L5-S1. The lower aspect of the fixation is not imaged. Surgical instruments are identified for localization purposes a posterior to L1 and T11. IMPRESSION: Intraoperative localization. Electronically Signed   By: Nolon Nations M.D.   On: 01/18/2016 19:11   Dg C-arm 61-120 Min  Result Date: 01/18/2016 CLINICAL DATA:  T7-L2 laminectomy with fixation. EXAM: OPERATIVE THORACIC SPINE 4 VIEW(S) COMPARISON:  CT myelogram 11/01/2007. FINDINGS: 4 intraoperative spot fluoro images are submitted. Small field-of-view makes level counting difficult. But no ribs are seen on the vertebral body that is at the level of the rod connection devices. This would be compatible with L1. This would put the cranial most pedicle screws at the T7 level, as indicated in the clinical data. Two screws are seen  at T7, T8, and T9 with unilateral screws seen at the T10 level. Additional screws bilaterally in the upper lumbar spine, but inferior extent of the fusion hardware has not been included on the spot fluoro films. IMPRESSION: Intraoperative assessment during thoracolumbar fusion. Electronically Signed   By: Misty Stanley M.D.   On: 01/18/2016 19:01     STUDIES:  CULTURES:  ANTIBIOTICS: Ancef surgical ppx  SIGNIFICANT EVENTS: 8/8 to OR for elective decompression  LINES/TUBES: ETT 8/8 > R rad art line 8/8 >  DISCUSSION: 65 year old male presented 8/8 for elective  surgical decompression and spinal fixation. On vent in shock post op. Almost 5L blood loss. PCCM to assist with medical management  ASSESSMENT / PLAN:  PULMONARY A: Inability to protect airway in post-operative setting  P:   Full vent support CXR ABG Vent bundle  CARDIOVASCULAR A:  Shock, intraoperative likely hypovolemic/vs hemorrhagic 4767mL blood loss estimated (s/p 1875 cell saver, 2 units FFP) H/o HTN, HLD  P:  MAP goal > 37mmHg Albumin finishing now Phenylephrine for MAP goal Assess blood count for transfusion need Holding home meds  RENAL A:   Hyperkalemia  P:   Repeat BMP Correct electrolytes as indicated  GASTROINTESTINAL A:   GERD  P:   Pepcid for SUP NPO  HEMATOLOGIC A:   ABLA Hematuria ?DIC  P:  Assess CBC, coags Transfuse per ICU guidelines DIC panel pending  INFECTIOUS A:   Surgical prophylaxis  P:   Ancef per neurosurgery  ENDOCRINE A:   No acute issues   P:   Follow glucose on chemistry  NEUROLOGIC A:   Severe spinal stenosis status post Decompressive laminectomies from T10-L1, pedicle screw fixation T7 T8 T9 and left T10 with rods stabilization from T7-L2.  P:   RASS goal: -2 Propofol infusion PRN fentanyl Per Neurosurgery  FAMILY  - Updates: Wife updated by Dr. Ellene Route  - Inter-disciplinary family meet or Palliative Care meeting due by:  8/15    Pulmonary and [place] Pager: [phone]  01/18/2016, 7:37 PM

## 2016-01-18 NOTE — Progress Notes (Signed)
ANTIBIOTIC CONSULT NOTE - INITIAL  Pharmacy Consult for vancomycin Indication: surgical prophylaxis  Allergies  Allergen Reactions  . Aspirin Hives  . Bc Fast Pain [Aspirin-Caffeine] Hives  . Tape Rash    Surgical Tape- 2009ish  Back surgery - broke out- rash    Patient Measurements:   Adjusted Body Weight:   Vital Signs: Temp: 97.6 F (36.4 C) (08/08 2000) Temp Source: Oral (08/08 2000) BP: 73/58 (08/08 2000) Pulse Rate: 83 (08/08 2000) Intake/Output from previous day: No intake/output data recorded. Intake/Output from this shift: Total I/O In: 1050 [I.V.:800; IV Piggyback:250] Out: -   Labs:  Recent Labs  01/18/16 0943  01/18/16 1818 01/18/16 1850 01/18/16 1905 01/18/16 2010  WBC  --   --   --   --   --  23.3*  HGB  --   < > 12.9*  --  11.9* 12.4*  PLT  --   --   --  137*  --  134*  CREATININE 0.89  --   --   --   --   --   < > = values in this interval not displayed. Estimated Creatinine Clearance: 97.6 mL/min (by C-G formula based on SCr of 0.89 mg/dL). No results for input(s): VANCOTROUGH, VANCOPEAK, VANCORANDOM, GENTTROUGH, GENTPEAK, GENTRANDOM, TOBRATROUGH, TOBRAPEAK, TOBRARND, AMIKACINPEAK, AMIKACINTROU, AMIKACIN in the last 72 hours.   Microbiology: Recent Results (from the past 720 hour(s))  Surgical pcr screen     Status: None   Collection Time: 01/18/16  9:57 AM  Result Value Ref Range Status   MRSA, PCR NEGATIVE NEGATIVE Final   Staphylococcus aureus NEGATIVE NEGATIVE Final    Comment:        The Xpert SA Assay (FDA approved for NASAL specimens in patients over 5 years of age), is one component of a comprehensive surveillance program.  Test performance has been validated by [facility] for patients greater than or equal to 44 year old. It is not intended to diagnose infection nor to guide or monitor treatment.     Medical History: Past Medical History:  Diagnosis Date  . Arthritis    "severe in my back" (08/11/2015)  . Chronic  lower back pain   . Fatty liver   . GERD (gastroesophageal reflux disease)   . Hemorrhoids   . Hyperlipidemia   . Hypertension   . Low iron   . Seasonal allergies   . Shortness of breath dyspnea    With exertion    Medications:  Scheduled:  . sodium chloride   Intravenous Once  . sodium chloride   Intravenous Once  . [START ON 01/19/2016] antiseptic oral rinse  7 mL Mouth Rinse QID  . ceFAZolin      . chlorhexidine gluconate (SAGE KIT)  15 mL Mouth Rinse BID  . famotidine (PEPCID) IV  20 mg Intravenous Q12H  . senna  1 tablet Oral BID  . sodium chloride flush  3 mL Intravenous Q12H  . vancomycin       Infusions:  . sodium chloride    . sodium chloride    . lactated ringers 10 mL/hr at 01/18/16 0957  . phenylephrine (NEO-SYNEPHRINE) Adult infusion 60 mcg/min (01/18/16 2053)  . propofol (DIPRIVAN) infusion 40 mcg/kg/min (01/18/16 2008)   Assessment: 65 yo male s/p neurosurgery will be started on vancomycin for surgical prophylaxis.  CrCl ~98.  Patient got 1g of vancomycin at 1850  Goal of Therapy:  Vancomycin trough level 10-15 mcg/ml  Plan:  - vancomycin 1g iv q12h,  1st dose at 0600 on 01/19/16 - follow up on antibiotic plan before checking vancomycin trough  So, Tsz-Yin 01/18/2016,9:01 PM

## 2016-01-18 NOTE — Progress Notes (Signed)
[facility] Progress Note Patient Name: Russell Gonzales DOB: 18-Jun-1950 MRN: [id number]   Date of Service  01/18/2016  HPI/Events of Note  Rash - Bright pink chest, abdomen and legs. Patient got Cefazolin in the OR. Has another dose scheduled for 10 PM. Already on Pepcid IV for SUP.  eICU Interventions  Will order: 1. Benadryl 25 mg IV Q 6 hours PRN. 2. D/C Cefazolin. 3. Vancomycin per pharmacy consult.      Intervention Category Intermediate Interventions: Other:  Cherae Marton Cornelia Copa 01/18/2016, 9:01 PM

## 2016-01-18 NOTE — Anesthesia Postprocedure Evaluation (Signed)
Anesthesia Post Note  Patient: Russell Gonzales  Procedure(s) Performed: Procedure(s) (LRB): Thoracic Eight-Lumbar Two Laminectomy with posterior decompression/segmental fixation (N/A)  Patient location during evaluation: SICU Anesthesia Type: General Level of consciousness: sedated and patient remains intubated per anesthesia plan Pain management: pain level controlled Vital Signs Assessment: post-procedure vital signs reviewed and stable Respiratory status: patient remains intubated per anesthesia plan and patient on ventilator - see flowsheet for VS Cardiovascular status: stable Anesthetic complications: no    Last Vitals:  Vitals:   01/18/16 2100 01/18/16 2115  BP: 102/85 91/62  Pulse: 64   Resp: 12 12  Temp:      Last Pain:  Vitals:   01/18/16 2000  TempSrc: Oral                 Catalina Gravel

## 2016-01-18 NOTE — Progress Notes (Signed)
Patient ID: Russell Gonzales, male   DOB: [birth year], 65 y.o.   MRN: [id number] Patient remains intubated postoperatively He is heavily sedated Cannot assess motor function in lower extremities at

## 2016-01-18 NOTE — Progress Notes (Signed)
RT redressed Aline and Placed tube holder on pt. Pt stable. RT will cont to monitor.

## 2016-01-18 NOTE — H&P (Signed)
Russell Gonzales is an 65 y.o. male.   Chief Complaint: Bilateral leg weakness and back pain. Leg pain. HPI: Mr. Skalla is a 65 year old individual who was readmitted having been seen in emergency room a week ago. He underwent emergency myelogram. 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 About 9 months ago he underwent surgical decompression at the T11-T12 level. I sure he underwent myelography which demonstrated advanced spondylitic stenosis at multiple levels across the thoracic-lumbar junction. With his recent weakness repeat myelogram demonstrates that he has the same amount of spondylitic stenosis in this region. Been advised regarding surgery to decompress and stabilize from T8 down to his fusion at L2. He is now admitted for that procedure. He was started on some high dose steroids which did seem to help with his weakness in the legs.  Past Medical History:  Diagnosis Date  . Arthritis    "severe in my back" (08/11/2015)  . Chronic lower back pain   . Fatty liver   . GERD (gastroesophageal reflux disease)   . Hemorrhoids   . Hyperlipidemia   . Hypertension   . Low iron   . Seasonal allergies   . Shortness of breath dyspnea    With exertion    Past Surgical History:  Procedure Laterality Date  . ANKLE FRACTURE SURGERY Left 1972   "it was crushed; has screws and plates"  . ANTERIOR CERVICAL DECOMP/DISCECTOMY FUSION  05/2008  . BACK SURGERY    . COLONOSCOPY W/ BIOPSIES AND POLYPECTOMY    . FRACTURE SURGERY    . KNEE ARTHROSCOPY Left 1970s   "removed cyst"  . LUMBAR LAMINECTOMY/DECOMPRESSION MICRODISCECTOMY N/A 08/11/2015   Procedure: Laminectomy - Thoracic eleven - Thoracic tweleve;  Surgeon: Eustace Moore, MD;  Location: [facility];  Service: Neurosurgery;  Laterality: N/A;  . POLYPECTOMY  2016  . POSTERIOR LUMBAR FUSION  04/2008   "L1-S1; have rods and screws between 4  vertebrae"  . PROSTATE BIOPSY  2012  . THORACIC LAMINECTOMY  08/11/2015   Decompressive thoracic laminectomy, medial facetectomy and foraminotomy T11 and T12    Family History  Problem Relation Age of Onset  . Pancreatic cancer Mother   . Prostate cancer Father   . Colon cancer Neg Hx    Social History:  reports that he has quit smoking. His smoking use included Cigars. He has a 15.00 pack-year smoking history. He has never used smokeless tobacco. He reports that he drinks about 21.0 oz of alcohol per week . He reports that he uses drugs, including Other-see comments.  Allergies:  Allergies  Allergen Reactions  . Aspirin Hives  . Bc Fast Pain [Aspirin-Caffeine] Hives  . Tape Rash    Surgical Tape- 2009ish  Back surgery - broke out- rash    Facility-Administered Medications Prior to Admission  Medication Dose Route Frequency Provider Last Rate Last Dose  . 0.9 %  sodium chloride infusion  500 mL Intravenous Continuous Ladene Artist, MD       Medications Prior to Admission  Medication Sig Dispense Refill  . atorvastatin (LIPITOR) 20 MG tablet TAKE ONE TABLET BY MOUTH ONCE DAILY (Patient taking differently: TAKE ONE TABLET (20 mg) BY MOUTH every evening) 30 tablet 11  . dexamethasone (DECADRON) 4 MG tablet Take 1 tablet (4 mg total) by mouth 2 (two) times daily. 30 tablet 2  . lisinopril (PRINIVIL,ZESTRIL) 40 MG tablet  Take 1 tablet daily (Patient taking differently: Take 40 mg by mouth every evening. ) 30 tablet 11  . Multiple Vitamins-Minerals (MULTIVITAMIN ADULT PO) Take 1 tablet by mouth daily.     . traMADol (ULTRAM) 50 MG tablet Take 1 tablet (50 mg total) by mouth every 6 (six) hours as needed for moderate pain. 60 tablet 3  . fish oil-omega-3 fatty acids 1000 MG capsule Take 1 g by mouth 2 (two) times daily.      . hydrochlorothiazide (MICROZIDE) 12.5 MG capsule Take 1 capsule (12.5 mg total) by mouth daily. 30 capsule 11  . HYDROcodone-acetaminophen (NORCO) 5-325 MG tablet  Take 1 tablet by mouth every 6 (six) hours as needed for severe pain. 30 tablet 0  . ketoconazole (NIZORAL) 2 % cream Apply 1 application topically daily as needed for irritation. Reported on 07/01/2015      Results for orders placed or performed during the hospital encounter of 01/18/16 (from the past 48 hour(s))  Comprehensive metabolic panel     Status: Abnormal   Collection Time: 01/18/16  9:43 AM  Result Value Ref Range   Sodium 131 (L) 135 - 145 mmol/L   Potassium 5.3 (H) 3.5 - 5.1 mmol/L   Chloride 99 (L) 101 - 111 mmol/L   CO2 24 22 - 32 mmol/L   Glucose, Bld 151 (H) 65 - 99 mg/dL   BUN 22 (H) 6 - 20 mg/dL   Creatinine, Ser 0.89 0.61 - 1.24 mg/dL   Calcium 9.0 8.9 - 10.3 mg/dL   Total Protein 6.4 (L) 6.5 - 8.1 g/dL   Albumin 3.7 3.5 - 5.0 g/dL   AST 27 15 - 41 U/L   ALT 79 (H) 17 - 63 U/L   Alkaline Phosphatase 66 38 - 126 U/L   Total Bilirubin 1.1 0.3 - 1.2 mg/dL   GFR calc non Af Amer >60 >60 mL/min   GFR calc Af Amer >60 >60 mL/min    Comment: (NOTE) The eGFR has been calculated using the CKD EPI equation. This calculation has not been validated in all clinical situations. eGFR's persistently <60 mL/min signify possible Chronic Kidney Disease.    Anion gap 8 5 - 15  Type and screen     Status: None   Collection Time: 01/18/16  9:45 AM  Result Value Ref Range   ABO/RH(D) O POS    Antibody Screen NEG    Sample Expiration 01/21/2016    No results found.  Review of Systems  HENT: Negative.   Eyes: Negative.   Respiratory: Negative.   Cardiovascular: Negative.   Gastrointestinal: Negative.   Genitourinary: Negative.   Musculoskeletal: Positive for back pain.  Skin: Negative.   Neurological: Positive for tingling, tremors, focal weakness and weakness.  Endo/Heme/Allergies: Negative.   Psychiatric/Behavioral: Negative.     Blood pressure 139/84, pulse (!) 57, temperature 98.3 F (36.8 C), temperature source Oral, resp. rate 20, SpO2 95 %. Physical Exam   Constitutional: He is oriented to person, place, and time. He appears well-developed and well-nourished.  HENT:  Head: Normocephalic and atraumatic.  Eyes: Conjunctivae and EOM are normal. Pupils are equal, round, and reactive to light.  Neck: Normal range of motion. Neck supple.  GI: Soft. Bowel sounds are normal.  Musculoskeletal:  Marked deformity with thoracolumbar spine with tenderness palpation across the lumbosacral junction and across the thoracic lumbar junction. Positive straight leg raising bilaterally at 15. Marked increased in tone in lower extremities. Reflexes are 3+ in the patellae and clonus  at the ankles is noted.  Neurological: He is alert and oriented to person, place, and time.  Cranial nerve examination is normal. There is some increased tone in the lower extremities. Spasticity is noted when the patient walks with use of a cane. Weaknesses downgraded to 4 out of 5 in the major muscle groups in addition to the increase in tone.  Skin: Skin is warm.  Psychiatric: He has a normal mood and affect. His behavior is normal. Judgment and thought content normal.     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  Plan decompression from T10-L1 with fixation and fusion from T8-L2.  Earleen Newport, MD 01/18/2016, 12:05 PM

## 2016-01-18 NOTE — Anesthesia Preprocedure Evaluation (Addendum)
Anesthesia Evaluation  Patient identified by MRN, date of birth, ID band Patient awake    Reviewed: Allergy & Precautions, NPO status , Patient's Chart, lab work & pertinent test results  Airway Mallampati: III  TM Distance: >3 FB     Dental  (+) Edentulous Upper, Edentulous Lower   Pulmonary shortness of breath, COPD, former smoker,    Pulmonary exam normal        Cardiovascular hypertension, + Peripheral Vascular Disease  Normal cardiovascular exam     Neuro/Psych    GI/Hepatic GERD  ,  Endo/Other    Renal/GU      Musculoskeletal  (+) Arthritis ,   Abdominal   Peds  Hematology   Anesthesia Other Findings   Reproductive/Obstetrics                            Anesthesia Physical Anesthesia Plan  ASA: III  Anesthesia Plan: General   Post-op Pain Management:    Induction: Intravenous  Airway Management Planned: Oral ETT  Additional Equipment:   Intra-op Plan:   Post-operative Plan: Extubation in OR  Informed Consent: I have reviewed the patients History and Physical, chart, labs and discussed the procedure including the risks, benefits and alternatives for the proposed anesthesia with the patient or authorized representative who has indicated his/her understanding and acceptance.     Plan Discussed with: CRNA, Anesthesiologist and Surgeon  Anesthesia Plan Comments:         Anesthesia Quick Evaluation

## 2016-01-18 NOTE — Transfer of Care (Signed)
Immediate Anesthesia Transfer of Care Note  Patient: Russell Gonzales  Procedure(s) Performed: Procedure(s) with comments: Thoracic Eight-Lumbar Two Laminectomy with posterior decompression/segmental fixation (N/A) - T8 to L2 Laminectomy with posterior decompression/segmental fixation from T8 to L2  Patient Location: ICU  Anesthesia Type:General  Level of Consciousness: sedated and Patient remains intubated per anesthesia plan  Airway & Oxygen Therapy: Patient remains intubated per anesthesia plan and Patient placed on Ventilator (see vital sign flow sheet for setting)  Post-op Assessment: Report given to RN and Post -op Vital signs reviewed and stable  Post vital signs: Reviewed and stable  Last Vitals:  Vitals:   01/18/16 1929 01/18/16 1943  BP:    Pulse:  82  Resp: 12 12  Temp:      Last Pain:  Vitals:   01/18/16 0954  TempSrc: Oral      Patients Stated Pain Goal: 4 ([id number])  Complications: No apparent anesthesia complications

## 2016-01-19 ENCOUNTER — Inpatient Hospital Stay (HOSPITAL_COMMUNITY): Payer: PPO

## 2016-01-19 DIAGNOSIS — Z01818 Encounter for other preprocedural examination: Secondary | ICD-10-CM

## 2016-01-19 DIAGNOSIS — Z452 Encounter for adjustment and management of vascular access device: Secondary | ICD-10-CM

## 2016-01-19 DIAGNOSIS — J9600 Acute respiratory failure, unspecified whether with hypoxia or hypercapnia: Secondary | ICD-10-CM

## 2016-01-19 LAB — BLOOD GAS, ARTERIAL
ACID-BASE DEFICIT: 10.9 mmol/L — AB (ref 0.0–2.0)
Acid-base deficit: 6.4 mmol/L — ABNORMAL HIGH (ref 0.0–2.0)
BICARBONATE: 16.2 meq/L — AB (ref 20.0–24.0)
BICARBONATE: 17.1 meq/L — AB (ref 20.0–24.0)
DRAWN BY: 460661
Drawn by: 270271
FIO2: 0.3
FIO2: 0.3
LHR: 12 {breaths}/min
MECHVT: 580 mL
O2 Saturation: 96.2 %
O2 Saturation: 98.3 %
PATIENT TEMPERATURE: 98.6
PCO2 ART: 26.3 mmHg — AB (ref 35.0–45.0)
PEEP/CPAP: 5 cmH2O
PEEP: 5 cmH2O
Patient temperature: 98.6
RATE: 28 resp/min
TCO2: 17.6 mmol/L (ref 0–100)
TCO2: 18 mmol/L (ref 0–100)
VT: 580 mL
pCO2 arterial: 47.1 mmHg — ABNORMAL HIGH (ref 35.0–45.0)
pH, Arterial: 7.162 — CL (ref 7.350–7.450)
pH, Arterial: 7.43 (ref 7.350–7.450)
pO2, Arterial: 113 mmHg — ABNORMAL HIGH (ref 80.0–100.0)
pO2, Arterial: 112 mmHg — ABNORMAL HIGH (ref 80.0–100.0)

## 2016-01-19 LAB — BASIC METABOLIC PANEL
ANION GAP: 12 (ref 5–15)
Anion gap: 14 (ref 5–15)
Anion gap: 10 (ref 5–15)
Anion gap: 6 (ref 5–15)
BUN: 33 mg/dL — ABNORMAL HIGH (ref 6–20)
BUN: 35 mg/dL — AB (ref 6–20)
BUN: 39 mg/dL — ABNORMAL HIGH (ref 6–20)
BUN: 41 mg/dL — AB (ref 6–20)
CALCIUM: 6.8 mg/dL — AB (ref 8.9–10.3)
CHLORIDE: 99 mmol/L — AB (ref 101–111)
CO2: 16 mmol/L — AB (ref 22–32)
CO2: 17 mmol/L — ABNORMAL LOW (ref 22–32)
CO2: 22 mmol/L (ref 22–32)
CO2: 25 mmol/L (ref 22–32)
CREATININE: 1.86 mg/dL — AB (ref 0.61–1.24)
CREATININE: 1.93 mg/dL — AB (ref 0.61–1.24)
CREATININE: 2.21 mg/dL — AB (ref 0.61–1.24)
CREATININE: 2.47 mg/dL — AB (ref 0.61–1.24)
Calcium: 7.6 mg/dL — ABNORMAL LOW (ref 8.9–10.3)
Calcium: 7 mg/dL — ABNORMAL LOW (ref 8.9–10.3)
Calcium: 6.5 mg/dL — ABNORMAL LOW (ref 8.9–10.3)
Chloride: 103 mmol/L (ref 101–111)
Chloride: 101 mmol/L (ref 101–111)
Chloride: 103 mmol/L (ref 101–111)
GFR calc Af Amer: 41 mL/min — ABNORMAL LOW (ref >60)
GFR calc non Af Amer: 37 mL/min — ABNORMAL LOW (ref >60)
GFR calc non Af Amer: 35 mL/min — ABNORMAL LOW (ref >60)
GFR calc non Af Amer: 30 mL/min — ABNORMAL LOW (ref >60)
GFR, EST AFRICAN AMERICAN: 42 mL/min — AB (ref >60)
GFR, EST AFRICAN AMERICAN: 34 mL/min — AB (ref >60)
GFR, EST AFRICAN AMERICAN: 30 mL/min — AB (ref >60)
GFR, EST NON AFRICAN AMERICAN: 26 mL/min — AB (ref >60)
GLUCOSE: 311 mg/dL — AB (ref 65–99)
Glucose, Bld: 402 mg/dL — ABNORMAL HIGH (ref 65–99)
Glucose, Bld: 282 mg/dL — ABNORMAL HIGH (ref 65–99)
Glucose, Bld: 218 mg/dL — ABNORMAL HIGH (ref 65–99)
POTASSIUM: 5.3 mmol/L — AB (ref 3.5–5.1)
POTASSIUM: 4.3 mmol/L (ref 3.5–5.1)
Potassium: 4.7 mmol/L (ref 3.5–5.1)
Potassium: 4.8 mmol/L (ref 3.5–5.1)
SODIUM: 133 mmol/L — AB (ref 135–145)
SODIUM: 134 mmol/L — AB (ref 135–145)
Sodium: 129 mmol/L — ABNORMAL LOW (ref 135–145)
Sodium: 132 mmol/L — ABNORMAL LOW (ref 135–145)

## 2016-01-19 LAB — PREPARE FRESH FROZEN PLASMA
UNIT DIVISION: 0
Unit division: 0

## 2016-01-19 LAB — CG4 I-STAT (LACTIC ACID): Lactic Acid, Venous: 2.91 mmol/L (ref 0.5–1.9)

## 2016-01-19 LAB — HEPATIC FUNCTION PANEL
ALBUMIN: 2.9 g/dL — AB (ref 3.5–5.0)
ALT: 278 U/L — ABNORMAL HIGH (ref 17–63)
AST: 229 U/L — AB (ref 15–41)
Alkaline Phosphatase: 42 U/L (ref 38–126)
Bilirubin, Direct: 0.5 mg/dL (ref 0.1–0.5)
Indirect Bilirubin: 1.4 mg/dL — ABNORMAL HIGH (ref 0.3–0.9)
Total Bilirubin: 1.9 mg/dL — ABNORMAL HIGH (ref 0.3–1.2)
Total Protein: 4.4 g/dL — ABNORMAL LOW (ref 6.5–8.1)

## 2016-01-19 LAB — POCT I-STAT 3, ART BLOOD GAS (G3+)
Acid-base deficit: 4 mmol/L — ABNORMAL HIGH (ref 0.0–2.0)
BICARBONATE: 21.5 meq/L (ref 20.0–24.0)
O2 Saturation: 96 %
PCO2 ART: 39 mmHg (ref 35.0–45.0)
TCO2: 23 mmol/L (ref 0–100)
pH, Arterial: 7.348 — ABNORMAL LOW (ref 7.350–7.450)
pO2, Arterial: 89 mmHg (ref 80.0–100.0)

## 2016-01-19 LAB — TROPONIN I
TROPONIN I: 0.11 ng/mL — AB (ref <0.03)
Troponin I: 0.04 ng/mL (ref <0.03)
Troponin I: 0.16 ng/mL (ref <0.03)

## 2016-01-19 LAB — MAGNESIUM
Magnesium: 1.9 mg/dL (ref 1.7–2.4)
Magnesium: 1.6 mg/dL — ABNORMAL LOW (ref 1.7–2.4)

## 2016-01-19 LAB — GLUCOSE, CAPILLARY
GLUCOSE-CAPILLARY: 269 mg/dL — AB (ref 65–99)
GLUCOSE-CAPILLARY: 223 mg/dL — AB (ref 65–99)
Glucose-Capillary: 312 mg/dL — ABNORMAL HIGH (ref 65–99)
Glucose-Capillary: 171 mg/dL — ABNORMAL HIGH (ref 65–99)

## 2016-01-19 LAB — CBC WITH DIFFERENTIAL/PLATELET
BASOS PCT: 0 %
Basophils Absolute: 0 10*3/uL (ref 0.0–0.1)
Eosinophils Absolute: 0 10*3/uL (ref 0.0–0.7)
Eosinophils Relative: 0 %
HEMATOCRIT: 35.8 % — AB (ref 39.0–52.0)
HEMOGLOBIN: 11.9 g/dL — AB (ref 13.0–17.0)
LYMPHS ABS: 2 10*3/uL (ref 0.7–4.0)
Lymphocytes Relative: 8 %
MCH: 31.7 pg (ref 26.0–34.0)
MCHC: 33.2 g/dL (ref 30.0–36.0)
MCV: 95.5 fL (ref 78.0–100.0)
MONOS PCT: 11 %
Monocytes Absolute: 2.7 10*3/uL — ABNORMAL HIGH (ref 0.1–1.0)
NEUTROS ABS: 19.8 10*3/uL — AB (ref 1.7–7.7)
NEUTROS PCT: 81 %
Platelets: 142 10*3/uL — ABNORMAL LOW (ref 150–400)
RBC: 3.75 MIL/uL — AB (ref 4.22–5.81)
RDW: 12.8 % (ref 11.5–15.5)
WBC: 24.5 10*3/uL — ABNORMAL HIGH (ref 4.0–10.5)

## 2016-01-19 LAB — CBC
HCT: 32.8 % — ABNORMAL LOW (ref 39.0–52.0)
Hemoglobin: 11.4 g/dL — ABNORMAL LOW (ref 13.0–17.0)
MCH: 32.4 pg (ref 26.0–34.0)
MCHC: 34.8 g/dL (ref 30.0–36.0)
MCV: 93.2 fL (ref 78.0–100.0)
PLATELETS: 131 10*3/uL — AB (ref 150–400)
RBC: 3.52 MIL/uL — ABNORMAL LOW (ref 4.22–5.81)
RDW: 12.7 % (ref 11.5–15.5)
WBC: 21.3 10*3/uL — AB (ref 4.0–10.5)

## 2016-01-19 LAB — TRIGLYCERIDES: Triglycerides: 153 mg/dL — ABNORMAL HIGH (ref <150)

## 2016-01-19 LAB — LACTIC ACID, PLASMA: Lactic Acid, Venous: 7.3 mmol/L (ref 0.5–1.9)

## 2016-01-19 LAB — PROCALCITONIN: PROCALCITONIN: 3.89 ng/mL

## 2016-01-19 LAB — PHOSPHORUS
PHOSPHORUS: 7.1 mg/dL — AB (ref 2.5–4.6)
Phosphorus: 4.6 mg/dL (ref 2.5–4.6)

## 2016-01-19 LAB — PROTIME-INR
INR: 1.73
Prothrombin Time: 20.5 seconds — ABNORMAL HIGH (ref 11.4–15.2)

## 2016-01-19 MED ORDER — SODIUM CHLORIDE 0.9 % IV BOLUS (SEPSIS)
1000.0000 mL | Freq: Once | INTRAVENOUS | Status: AC
  Administered 2016-01-19: 1000 mL via INTRAVENOUS

## 2016-01-19 MED ORDER — SODIUM BICARBONATE 8.4 % IV SOLN
INTRAVENOUS | Status: AC
  Filled 2016-01-19: qty 100

## 2016-01-19 MED ORDER — SODIUM CHLORIDE 0.9 % IV SOLN
1.0000 g | Freq: Once | INTRAVENOUS | Status: AC
  Administered 2016-01-19: 1 g via INTRAVENOUS
  Filled 2016-01-19: qty 10

## 2016-01-19 MED ORDER — FLUDROCORTISONE 0.1 MG/ML ORAL SUSPENSION
0.1000 mg | Freq: Every day | ORAL | Status: DC
  Administered 2016-01-19: 0.1 mg
  Filled 2016-01-19 (×3): qty 1

## 2016-01-19 MED ORDER — MAGNESIUM SULFATE IN D5W 1-5 GM/100ML-% IV SOLN
1.0000 g | Freq: Once | INTRAVENOUS | Status: AC
  Administered 2016-01-19: 1 g via INTRAVENOUS
  Filled 2016-01-19: qty 100

## 2016-01-19 MED ORDER — SODIUM CHLORIDE 0.9 % IV BOLUS (SEPSIS): 500.0000 mL | Freq: Once | INTRAVENOUS | Status: AC

## 2016-01-19 MED ORDER — SODIUM CHLORIDE 0.9 % IV BOLUS (SEPSIS)
3000.0000 mL | Freq: Once | INTRAVENOUS | Status: AC
  Administered 2016-01-19: 3000 mL via INTRAVENOUS

## 2016-01-19 MED ORDER — INSULIN ASPART 100 UNIT/ML ~~LOC~~ SOLN
0.0000 [IU] | SUBCUTANEOUS | Status: DC
  Administered 2016-01-19: 3 [IU] via SUBCUTANEOUS
  Administered 2016-01-19: 11 [IU] via SUBCUTANEOUS
  Administered 2016-01-19: 8 [IU] via SUBCUTANEOUS
  Administered 2016-01-19: 5 [IU] via SUBCUTANEOUS
  Administered 2016-01-19 – 2016-01-20 (×2): 3 [IU] via SUBCUTANEOUS
  Administered 2016-01-20: 5 [IU] via SUBCUTANEOUS
  Administered 2016-01-20: 3 [IU] via SUBCUTANEOUS
  Administered 2016-01-20: 8 [IU] via SUBCUTANEOUS
  Administered 2016-01-20: 5 [IU] via SUBCUTANEOUS
  Administered 2016-01-21 (×2): 2 [IU] via SUBCUTANEOUS
  Administered 2016-01-21: 3 [IU] via SUBCUTANEOUS
  Administered 2016-01-21 – 2016-01-23 (×4): 2 [IU] via SUBCUTANEOUS
  Administered 2016-01-23: 3 [IU] via SUBCUTANEOUS
  Administered 2016-01-23 – 2016-01-24 (×2): 2 [IU] via SUBCUTANEOUS

## 2016-01-19 MED ORDER — SODIUM BICARBONATE 8.4 % IV SOLN
100.0000 meq | Freq: Once | INTRAVENOUS | Status: AC
  Administered 2016-01-19: 100 meq via INTRAVENOUS

## 2016-01-19 MED ORDER — SODIUM BICARBONATE 8.4 % IV SOLN
INTRAVENOUS | Status: DC
  Administered 2016-01-19 – 2016-01-20 (×3): via INTRAVENOUS
  Filled 2016-01-19 (×5): qty 150

## 2016-01-19 MED ORDER — SODIUM CHLORIDE 0.9 % IV BOLUS (SEPSIS)
500.0000 mL | Freq: Once | INTRAVENOUS | Status: AC
  Administered 2016-01-19: 500 mL via INTRAVENOUS

## 2016-01-19 MED ORDER — SODIUM POLYSTYRENE SULFONATE 15 GM/60ML PO SUSP
30.0000 g | Freq: Once | ORAL | Status: AC
  Administered 2016-01-19: 30 g
  Filled 2016-01-19: qty 120

## 2016-01-19 MED ORDER — DEXTROSE 5 % IV SOLN
30.0000 ug/min | INTRAVENOUS | Status: DC
  Filled 2016-01-19: qty 4

## 2016-01-19 MED ORDER — HYDROCORTISONE NA SUCCINATE PF 100 MG IJ SOLR
50.0000 mg | Freq: Four times a day (QID) | INTRAMUSCULAR | Status: DC
  Administered 2016-01-19 – 2016-01-20 (×4): 50 mg via INTRAVENOUS
  Filled 2016-01-19 (×4): qty 2

## 2016-01-19 MED ORDER — DEXTROSE 5 % IV SOLN
1.0000 g | Freq: Three times a day (TID) | INTRAVENOUS | Status: DC
  Administered 2016-01-19 – 2016-01-20 (×4): 1 g via INTRAVENOUS
  Filled 2016-01-19 (×7): qty 1

## 2016-01-19 MED ORDER — DEXTROSE 5 % IV SOLN
2.0000 ug/min | INTRAVENOUS | Status: DC
  Administered 2016-01-19: 16 ug/min via INTRAVENOUS
  Administered 2016-01-19: 10 ug/min via INTRAVENOUS
  Filled 2016-01-19: qty 16

## 2016-01-19 MED FILL — Sodium Chloride IV Soln 0.9%: INTRAVENOUS | Qty: 1000 | Status: AC

## 2016-01-19 MED FILL — Sodium Chloride Irrigation Soln 0.9%: Qty: 3000 | Status: AC

## 2016-01-19 MED FILL — Heparin Sodium (Porcine) Inj 1000 Unit/ML: INTRAMUSCULAR | Qty: 30 | Status: AC

## 2016-01-19 NOTE — Evaluation (Signed)
Physical Therapy Evaluation Patient Details Name: Russell Gonzales MRN: [id number] DOB: 03-30-1951 Today's Date: 01/19/2016   History of Present Illness  65 y.o. male admitted to [facility] on 01/18/16 for elective decompressive leminectomies from T10-L1, fusion T7-L2.  Pt with significant PMXh of SBO, HTN, low Fe, HTN, throacic laminectomy 08/2015, L1-S1 fusion 04/2008, L knee arthroscopy, ACDF 05/2008, and L ankle fx surgery.  Clinical Impression  Pt was able to sit EOB with brace donned in supine today.  He was limited by lightheadedness in sitting and was unable to attempt standing as he became pre-syncopal.  RN made aware.  Automatic BP monitor not giving accurate BPs, so she took one manually that was 80s/50s.  Hr increased to 122 in sitting.  Pt may need post acute rehab depending on how he progresses acutely.   PT to follow acutely for deficits listed below.       Follow Up Recommendations CIR    Equipment Recommendations  None recommended by PT    Recommendations for Other Services Rehab consult     Precautions / Restrictions Precautions Precautions: Fall;Back Precaution Comments: verbally reviewed back precautions, log roll technique Required Braces or Orthoses: Spinal Brace Spinal Brace: Thoracolumbosacral orthotic;Other (comment) (donned in supine due to lack of orders)      Mobility  Bed Mobility Overal bed mobility: +2 for physical assistance;Needs Assistance Bed Mobility: Rolling;Sidelying to Sit;Sit to Sidelying Rolling: +2 for physical assistance;Mod assist Sidelying to sit: +2 for physical assistance;Mod assist     Sit to sidelying: +2 for physical assistance;Mod assist General bed mobility comments: Two person mod assist to help progress trunk over to side lying.  Assist needed to help pt walk legs over EOB and push up to sitting with trunk.  Assist of legs and control of trunk to go back to side lying from sitting.  Pt needed to roll multiple times to get brace on and  adjusted correctly.   Transfers                 General transfer comment: Pt became too lightheaded EOB, so we were unable to attempt standing.       Balance Overall balance assessment: Needs assistance Sitting-balance support: Feet supported;Single extremity supported Sitting balance-Leahy Scale: Fair Sitting balance - Comments: Sat EOB attempting to take a BP (not accurate on monitor, likely due to pt moving and using his arm to prop up in sitting).  Pt continued to report lightheadedness in sitting to the point that he needed to be positioned back to sidelying and then supine as he felt pre-syncopal. RN made aware and took a manual BP.                                      Pertinent Vitals/Pain Pain Assessment: 0-10 Pain Score: 8  Pain Location: upper back with mobility Pain Descriptors / Indicators: Aching;Burning Pain Intervention(s): Limited activity within patient's tolerance;Monitored during session;Repositioned;Patient requesting pain meds-RN notified    Home Living Family/patient expects to be discharged to:: Private residence Living Arrangements: Spouse/significant other Available Help at Discharge: Family;Available PRN/intermittently Type of Home: Mobile home Home Access: Stairs to enter Entrance Stairs-Rails: Left (wall on the right) Entrance Stairs-Number of Steps: 3 Home Layout: One level Home Equipment: Walker - 4 wheels;Cane - single point;Grab bars - tub/shower;Shower seat      Prior Function Level of Independence: Independent with assistive device(s)  Comments: increased difficutly ambulating just prior to admission with dragging L leg        Extremity/Trunk Assessment   Upper Extremity Assessment: Overall WFL for tasks assessed           Lower Extremity Assessment: Generalized weakness (sensation LT intact, at least 3/5 with gross seated EOB )      Cervical / Trunk Assessment: Other exceptions  Communication    Communication: No difficulties  Cognition Arousal/Alertness: Awake/alert Behavior During Therapy: WFL for tasks assessed/performed Overall Cognitive Status: Within Functional Limits for tasks assessed                               Assessment/Plan    PT Assessment Patient needs continued PT services  PT Diagnosis Difficulty walking;Abnormality of gait;Generalized weakness;Acute pain   PT Problem List Decreased strength;Decreased activity tolerance;Decreased balance;Decreased mobility;Decreased knowledge of use of DME;Decreased knowledge of precautions;Cardiopulmonary status limiting activity;Obesity;Pain  PT Treatment Interventions DME instruction;Gait training;Stair training;Functional mobility training;Therapeutic activities;Therapeutic exercise;Balance training;Neuromuscular re-education;Patient/family education   PT Goals (Current goals can be found in the Care Plan section) Acute Rehab PT Goals Patient Stated Goal: to get his legs stronger PT Goal Formulation: With patient Time For Goal Achievement: 01/26/16 Potential to Achieve Goals: Good    Frequency Min 5X/week           End of Session Equipment Utilized During Treatment: Back brace Activity Tolerance: Patient limited by pain Patient left: in bed;with call bell/phone within reach Nurse Communication: Mobility status;Patient requests pain meds         Time: [id number] PT Time Calculation (min) (ACUTE ONLY): 45 min   Charges:   PT Evaluation $PT Eval Moderate Complexity: 1 Procedure PT Treatments $Therapeutic Activity: 23-37 mins        Cassie Shedlock B. [place], [place], DPT [phone]   01/19/2016, 6:51 PM

## 2016-01-19 NOTE — Progress Notes (Signed)
PULMONARY / CRITICAL CARE MEDICINE   Name: Russell Gonzales MRN: [id number] DOB: 1950/07/29    ADMISSION DATE:  01/18/2016 CONSULTATION DATE:  8/8  REFERRING MD:  Dr. Ellene Route  CHIEF COMPLAINT:  Back surgery  HISTORY OF PRESENT ILLNESS:  65 year old male with past medical history as below, which is significant for chronic back pain, fatty liver disease, hypertension, hyperlipidemia, and GERD. 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 He presented again the end of July 2017 with complaints of leg pain and numbness. He underwent repeat myelography which demonstrated advanced spondylitic stenosis at multiple levels across the thoracic-lumbar junction and he has been recommended for surgical decompression and stabilization from T8 down to L2. 01/18/2016 he presented for elective procedure which was without complication. Postoperatively he was sent to the ICU for recovery and PCCM has been asked to evaluate.  SUBJECTIVE:  Awake and alert . Moving great Vt Levophed at 3 mic Had uncompensated met acidosis this am   VITAL SIGNS: BP 110/77   Pulse 87   Temp 98.4 F (36.9 C) (Axillary)   Resp 14   SpO2 100%   HEMODYNAMICS:    VENTILATOR SETTINGS: Vent Mode: PSV;CPAP FiO2 (%):  [30 %-40 %] 30 % Set Rate:  [12 bmp-28 bmp] 28 bmp Vt Set:  [580 mL-800 mL] 580 mL PEEP:  [5 cmH20] 5 cmH20 Pressure Support:  [5 cmH20] 5 cmH20 Plateau Pressure:  [13 cmH20-18 cmH20] 18 cmH20  INTAKE / OUTPUT: I/O last 3 completed shifts: In: 11887.8 [I.V.:6519.8; QIONG:2952; IV WUXLKGMWN:0272] Out: 5366 [YQIHK:7425; Drains:100; Blood:4100]  PHYSICAL EXAMINATION: General:  Obese male in NAD on vent Neuro:  Awake and alert, mae x 4 HEENT:  [place]/AT, PERRL, no JVD Cardiovascular: RRR, no MRG, on levo drip Lungs:  Clear Abdomen:  Soft, non-distended Musculoskeletal:  No acute deformity Skin:   Grossly intact. No pallor  LABS:  BMET  Recent Labs Lab 01/18/16 2100 01/19/16 0235 01/19/16 0609  NA 129* 129* 132*  K 5.4* 5.3* 4.7  CL 100* 99* 103  CO2 18* 16* 17*  BUN 27* 33* 35*  CREATININE 1.29* 1.86* 1.93*  GLUCOSE 348* 402* 311*    Electrolytes  Recent Labs Lab 01/18/16 2100 01/19/16 0235 01/19/16 0609  CALCIUM 8.0* 7.6* 7.0*  MG  --  1.9 1.6*  PHOS  --  7.1* 4.6    CBC  Recent Labs Lab 01/18/16 2010 01/19/16 0235 01/19/16 0516  WBC 23.3* 24.5* 21.3*  HGB 12.4* 11.9* 11.4*  HCT 36.3* 35.8* 32.8*  PLT 134* 142* 131*    Coag's  Recent Labs Lab 01/18/16 1850 01/18/16 2010 01/19/16 0302  APTT 28  --   --   INR 1.37 1.47 1.73    Sepsis Markers  Recent Labs Lab 01/19/16 0237 01/19/16 0454  LATICACIDVEN 7.3*  --   PROCALCITON  --  3.89    ABG  Recent Labs Lab 01/18/16 2055 01/19/16 0334 01/19/16 0606  PHART 7.306* 7.162* 7.430  PCO2ART 36.7 47.1* 26.3*  PO2ART 132* 113* 112*    Liver Enzymes  Recent Labs Lab 01/18/16 0943 01/19/16 0235  AST 27 229*  ALT 79* 278*  ALKPHOS 66 42  BILITOT 1.1 1.9*  ALBUMIN 3.7 2.9*    Cardiac Enzymes  Recent Labs Lab 01/19/16 0017 01/19/16 0454  TROPONINI 0.04* 0.11*    Glucose No results for input(s): GLUCAP in the last 168 hours.  Imaging  Dg Thoracolumabar Spine  Result Date: 01/18/2016 CLINICAL DATA:  T7-L2 laminectomy with fixation. EXAM: OPERATIVE THORACIC SPINE 4 VIEW(S) COMPARISON:  CT myelogram 11/01/2007. FINDINGS: 4 intraoperative spot fluoro images are submitted. Small field-of-view makes level counting difficult. But no ribs are seen on the vertebral body that is at the level of the rod connection devices. This would be compatible with L1. This would put the cranial most pedicle screws at the T7 level, as indicated in the clinical data. Two screws are seen at T7, T8, and T9 with unilateral screws seen at the T10 level. Additional screws bilaterally in the upper lumbar  spine, but inferior extent of the fusion hardware has not been included on the spot fluoro films. IMPRESSION: Intraoperative assessment during thoracolumbar fusion. Electronically Signed   By: Kennith Center M.D.   On: 01/18/2016 19:01   Dg Thoracic Spine 1 View  Result Date: 01/18/2016 CLINICAL DATA:  Localization film for T7-L2 laminectomy/fixation. EXAM: OPERATIVE THORACIC SPINE 1 VIEW(S) COMPARISON:  CT 01/10/2016 FINDINGS: For consistency, numbering for today's exam is based on most recent CT myelogram performed on 01/10/2016. This study describes a chronic compression at L4. Single lateral view is performed, demonstrating a posterior retractor is in place. Patient has had remote posterior fusion involving L2-3 and L5-S1. The lower aspect of the fixation is not imaged. Surgical instruments are identified for localization purposes a posterior to L1 and T11. IMPRESSION: Intraoperative localization. Electronically Signed   By: Norva Pavlov M.D.   On: 01/18/2016 19:11   Dg Chest Port 1 View  Result Date: 01/19/2016 CLINICAL DATA:  Intubation. EXAM: PORTABLE CHEST 1 VIEW COMPARISON:  01/18/2016. FINDINGS: Interim placement NG tube, its tip is below left hemidiaphragm Endotracheal tube and left IJ line in Stable cardiomegaly. No pulmonary venous congestion. Left lower lobe subsegmental atelectasis and or infiltrate. Small left pleural effusion. No pneumothorax. Cervical spine fusion. Thoracolumbar spine fusion. Thoracolumbar spine scoliosis. Surgical staples noted over the chest. IMPRESSION: 1. Interim placement NG tube, its tip is below left hemidiaphragm. Endotracheal tube and left IJ line stable position. 2. Left lower lobe subsegmental atelectasis and or mild infiltrate. Small left pleural effusion. 3.  Stable cardiomegaly.  No pulmonary venous congestion. Electronically Signed   By: Maisie Fus  Register   On: 01/19/2016 07:37   Dg Chest Port 1 View  Result Date: 01/19/2016 CLINICAL DATA:  Encounter for  central line placement. EXAM: PORTABLE CHEST 1 VIEW COMPARISON:  Yesterday at 1828 hour FINDINGS: Tip of the new left central line in the region of the mid-distal SVC. No pneumothorax. The endotracheal tube is 4.6 cm from the carina. Stable heart size and mediastinal contours. Bibasilar opacities likely atelectasis. Atelectasis likely accounts for the retrocardiac opacity on prior exam. Recent thoracic spinal hardware with skin staples. IMPRESSION: 1. Tip of the left central line in the region of the mid-distal SVC. No pneumothorax. Endotracheal tube in place. 2. Bibasilar atelectasis. Electronically Signed   By: Rubye Oaks M.D.   On: 01/19/2016 03:43   Portable Chest Xray  Result Date: 01/18/2016 CLINICAL DATA:  Encounter for endotracheal tube placement. EXAM: PORTABLE CHEST 1 VIEW COMPARISON:  Chest radiographs 08/11/2015 FINDINGS: Endotracheal tube is 2.1 cm from the carina. Lung volumes are low. Mild cardiomegaly. Left lung base opacity, and difficult to exclude small left pleural effusion. No pulmonary edema. No evidence of pneumothorax. New fusion hardware in the lower thoracic spine with overlying skin staples. IMPRESSION: 1. Endotracheal tube 2.1 cm from the carina. 2. Cardiomegaly. Left basilar opacity  is likely atelectasis, difficult to exclude left pleural effusion. Electronically Signed   By: Rubye Oaks M.D.   On: 01/18/2016 20:48   Dg Abd Portable 1v  Result Date: 01/19/2016 CLINICAL DATA:  Orogastric tube placement.  Initial encounter. EXAM: PORTABLE ABDOMEN - 1 VIEW COMPARISON:  CT of the lumbar spine performed 01/10/2016 FINDINGS: The patient's enteric tube is seen ending overlying the body of the stomach. The visualized bowel gas pattern is unremarkable. Scattered air and stool filled loops of colon are seen; no abnormal dilatation of small bowel loops is seen to suggest small bowel obstruction. No free intra-abdominal air is identified, though evaluation for free air is limited on  a single supine view. The visualized osseous structures are within normal limits; the sacroiliac joints are unremarkable in appearance. The visualized lung bases are essentially clear. Thoracolumbar spinal fusion hardware is noted. IMPRESSION: Enteric tube seen ending overlying the body of the stomach. Electronically Signed   By: Roanna Raider M.D.   On: 01/19/2016 05:06   Dg C-arm 61-120 Min  Result Date: 01/18/2016 CLINICAL DATA:  T7-L2 laminectomy with fixation. EXAM: OPERATIVE THORACIC SPINE 4 VIEW(S) COMPARISON:  CT myelogram 11/01/2007. FINDINGS: 4 intraoperative spot fluoro images are submitted. Small field-of-view makes level counting difficult. But no ribs are seen on the vertebral body that is at the level of the rod connection devices. This would be compatible with L1. This would put the cranial most pedicle screws at the T7 level, as indicated in the clinical data. Two screws are seen at T7, T8, and T9 with unilateral screws seen at the T10 level. Additional screws bilaterally in the upper lumbar spine, but inferior extent of the fusion hardware has not been included on the spot fluoro films. IMPRESSION: Intraoperative assessment during thoracolumbar fusion. Electronically Signed   By: Kennith Center M.D.   On: 01/18/2016 19:01     STUDIES:  CULTURES:  ANTIBIOTICS: 8/8 Azactam>> 8/8 vanc>>  SIGNIFICANT EVENTS: 8/8 to OR for elective decompression 8/9- arf  LINES/TUBES: ETT 8/8 > R rad art line 8/8 > Line left IJ 8/8>>>  DISCUSSION: 65 year old male presented 8/8 for elective surgical decompression and spinal fixation. On vent in shock post op. Almost 5L blood loss. PCCM to assist with medical management  ASSESSMENT / PLAN:  PULMONARY A: Inability to protect airway in post-operative setting  P:   Full vent support CXR ABG Vent bundle Extubate once metabolic disarray corrected  CARDIOVASCULAR A:  Shock, intraoperative likely hypovolemic/vs hemorrhagic blood  loss estimated (s/p 1875 cell saver, 2 units FFP) H/o HTN, HLD  P:  MAP goal > Albumin finishing now Levo for MAP goal Assess blood count for transfusion need Holding home meds  RENAL Lab Results  Component Value Date   CREATININE 1.93 (H) 01/19/2016   CREATININE 1.86 (H) 01/19/2016   CREATININE 1.29 (H) 01/18/2016   CREATININE 0.78 03/30/2015   CREATININE 0.80 12/22/2014   CREATININE 0.67 08/20/2014    Recent Labs Lab 01/18/16 2100 01/19/16 0235 01/19/16 0609  K 5.4* 5.3* 4.7   Acidosis  A:   Hyperkalemia(resolved) AKI  P:   Repeat BMP Correct electrolytes as indicated Follow creatine DC bicarb drip after further lab s  GASTROINTESTINAL A:   GERD  P:   Pepcid for SUP NPO  HEMATOLOGIC A:   ABLA Hematuria(resolved) ?DIC  P:  Assess CBC, coags Transfuse per ICU guidelines DIC panel d dimer 13.2 , fibrogen 162, inr 1.73, but he is post 4700  cc blood loss with cell saver transfusion. Will continue to monitor  INFECTIOUS A:   Surgical prophylaxis Presumed sepsis  P:   Azactam / vanc per NS  ENDOCRINE  A:   8/9 glucose 311,   P:   Check cbg may need ssi if glucose accurate  NEUROLOGIC A:   Severe spinal stenosis status post Decompressive laminectomies from T10-L1, pedicle screw fixation T7 T8 T9 and left T10 with rods stabilization from T7-L2. Awake and alert 8/9  P:   RASS goal: 0 Propofol infusion PRN fentanyl Per Neurosurgery  FAMILY  - Updates: Wife updated by Dr. Ellene Route and S Minor 8/9  - Inter-disciplinary family meet or Palliative Care meeting due by:  8/15    Richardson Landry Minor ACNP Maryanna Shape PCCM Pager [phone] till 3 pm If no answer page [phone] 01/19/2016, 9:33 AM  STAFF NOTE: I, Merrie Roof, MD FACP have personally reviewed patient's available data, including medical history, events of note, physical examination and test results as part of my evaluation. I have discussed with resident/NP and other care  providers such as pharmacist, RN and RRT. In addition, I personally evaluated patient and elicited key findings of: awake, follows commands well, appears strong, lungs are clear, pcxr neg, this reminds me of a severe inflammatory reaction maybe from medication, (ABX), blood loss soes not necessarily explain the metabolic changes, continued pos balance, wean cpap 5 ps 5, goal 30 min , assess lactic to ensure that this is at least downward and if we extubate him he can maintain compensation for this, mag supp, ARF is ATN, if renal failure worsens I would assess urein eos for interstitial nephritis from abx reaction, lastly, his shock is mild and should NOT prevent extubation at current pressor needs, he likely has Adrenal insuff, add stress steroids and florinef now, if his renal fxn worsens would dc florinef then as would not be effective, I updated pt and wife in full, likley we can extubate him soon The patient is critically ill with multiple organ systems failure and requires high complexity decision making for assessment and support, frequent evaluation and titration of therapies, application of advanced monitoring technologies and extensive interpretation of multiple databases.   Critical Care Time devoted to patient care services described in this note is 35 Minutes. This time reflects time of care of this signee: Merrie Roof, MD FACP. This critical care time does not reflect procedure time, or teaching time or supervisory time of PA/NP/Med student/Med Resident etc but could involve care discussion time. Rest per NP/medical resident whose note is outlined above and that I agree with   Lavon Paganini. Titus Mould, MD, [place] Pgr: [facility] 01/19/2016 11:26 AM

## 2016-01-19 NOTE — Progress Notes (Signed)
Patient ID: Russell Gonzales, male   DOB: Sep 22, 1950, 65 y.o.   MRN: [id number] Vital signs are stable currently the patient is tachycardic. Patient had an eventful night with significant acidosis and hypotension. He was treated with sepsis protocol. Seems much improved now on his melding that he would like to be extubated. I will defer this to critical care service. Motor function appears intact in lower extremities with patient able to lift legs off the bed. Drain with very modest output. Will need to be fitted for TLSO. After extubated

## 2016-01-19 NOTE — Progress Notes (Signed)
eLink Physician-Brief Progress Note Patient Name: Russell Gonzales DOB: [birth year] MRN: [id number]   Worsening shock x 20-30 min SBP 40 on neo 60 and fluids - cuff and aline correlation  + ->  Stat increased neo to 245mcg -> SBP 70/map 66 and HR 105  Plan - fluid bolus 1L -0 calcum gluconate x 1 - ekg 12 lead - trop stat - cbc, bmet, lft, lactate stat and inr - emergent cvl by APP [name]   Intervention Category Major Interventions: Hypotension - evaluation and management;Shock - evaluation and management  [name] 01/19/2016, 2:24 AM

## 2016-01-19 NOTE — Progress Notes (Signed)
[facility] Progress Note Patient Name: KOLTEN PAPPA DOB: 19-Jul-1950 MRN: [id number]  RN calling eMD  - patient still shocky and tachycardic on levophed. Labs have rresulted and below  PULMONARY  Recent Labs Lab 01/18/16 1818 01/18/16 1905 01/18/16 2055 01/19/16 0334  PHART 7.367 7.352 7.306* 7.162*  PCO2ART 38.6 40.0 36.7 47.1*  PO2ART 193.0* 191.0* 132* 113*  HCO3 22.1 22.2 17.8* 16.2*  TCO2 23 23 18.9 17.6  O2SAT 100.0 100.0 98.2 96.2    CBC  Recent Labs Lab 01/18/16 1850 01/18/16 1905 01/18/16 2010 01/19/16 0235  HGB  --  11.9* 12.4* 11.9*  HCT  --  35.0* 36.3* 35.8*  WBC  --   --  23.3* 24.5*  PLT 137*  --  134* 142*    COAGULATION  Recent Labs Lab 01/18/16 1850 01/18/16 2010 01/19/16 0302  INR 1.37 1.47 1.73    CARDIAC   Recent Labs Lab 01/19/16 0017  TROPONINI 0.04*   No results for input(s): PROBNP in the last 168 hours.   CHEMISTRY  Recent Labs Lab 01/18/16 0943 01/18/16 1728 01/18/16 1818 01/18/16 1905 01/18/16 2100 01/19/16 0235  NA 131* 133* 132* 130* 129* 129*  K 5.3* 5.4* 5.5* 5.6* 5.4* 5.3*  CL 99*  --   --   --  100* 99*  CO2 24  --   --   --  18* 16*  GLUCOSE 151* 230*  --   --  348* 402*  BUN 22*  --   --   --  27* 33*  CREATININE 0.89  --   --   --  1.29* 1.86*  CALCIUM 9.0  --   --   --  8.0* 7.6*  MG  --   --   --   --   --  1.9  PHOS  --   --   --   --   --  7.1*   Estimated Creatinine Clearance: 46.7 mL/min (by C-G formula based on SCr of 1.86 mg/dL).   LIVER  Recent Labs Lab 01/18/16 0943 01/18/16 1850 01/18/16 2010 01/19/16 0235 01/19/16 0302  AST 27  --   --  229*  --   ALT 79*  --   --  278*  --   ALKPHOS 66  --   --  42  --   BILITOT 1.1  --   --  1.9*  --   PROT 6.4*  --   --  4.4*  --   ALBUMIN 3.7  --   --  2.9*  --   INR  --  1.37 1.47  --  1.73     INFECTIOUS  Recent Labs Lab 01/19/16 0237  LATICACIDVEN 7.3*      Result Date: 01/19/2016 CLINICAL DATA:  Encounter  for central line placement. EXAM: PORTABLE CHEST 1 VIEW COMPARISON:  Yesterday at 1828 hour FINDINGS: Tip of the new left central line in the region of the mid-distal SVC. No pneumothorax. The endotracheal tube is 4.6 cm from the carina. Stable heart size and mediastinal contours. Bibasilar opacities likely atelectasis. Atelectasis likely accounts for the retrocardiac opacity on prior exam. Recent thoracic spinal hardware with skin staples. IMPRESSION: 1. Tip of the left central line in the region of the mid-distal SVC. No pneumothorax. Endotracheal tube in place. 2. Bibasilar atelectasis. Electronically Signed   By: Jeb Levering M.D.   On: 01/19/2016 03:43    A) Circulatory shock  Severe metab and resp acidosis  Lactic acidosis  No bleeding  Emerging shock liver AKI/ATNl mild hyperkalemia  P Increase RR on vent 12-> 28 Stat bicarb and gtt kayexalate 3L fluid bolus and then saline at 125cc/h OG tube  Cycle trop  repe abg 7-8am Repeat labs 10am  Intervention Category Major Interventions: Acid-Base disturbance - evaluation and management;Electrolyte abnormality - evaluation and management;Shock - evaluation and management Intermediate Interventions: Oliguria - evaluation and management  Larri Yehle 01/19/2016, 4:09 AM

## 2016-01-19 NOTE — Progress Notes (Signed)
Pharmacy Antibiotic Note  JOEANGEL PROROK is a 65 y.o. male with VDRF/sepsis.  Pharmacy has been consulted for Aztreonam dosing.  Plan: Aztreonam 1 g IV q8h     Temp (24hrs), Avg:98 F (36.7 C), Min:97.6 F (36.4 C), Max:98.3 F (36.8 C)   Recent Labs Lab 01/18/16 0943 01/18/16 2010 01/18/16 2100 01/19/16 0235 01/19/16 0237 01/19/16 0516  WBC  --  23.3*  --  24.5*  --  21.3*  CREATININE 0.89  --  1.29* 1.86*  --   --   LATICACIDVEN  --   --   --   --  7.3*  --     Estimated Creatinine Clearance: 46.7 mL/min (by C-G formula based on SCr of 1.86 mg/dL).    Allergies  Allergen Reactions  . Ancef [Cefazolin]     RASH  . Aspirin Hives  . Bc Fast Pain [Aspirin-Caffeine] Hives  . Tape Rash    Surgical Tape- 2009ish  Back surgery - broke out- rash    Caryl Pina 01/19/2016 6:41 AM

## 2016-01-19 NOTE — Procedures (Signed)
Central Venous Catheter Insertion Procedure Note Russell Gonzales [id number] 02-Feb-1951  Procedure: Insertion of Central Venous Catheter Indications: Assessment of intravascular volume, Drug and/or fluid administration and Frequent blood sampling  Procedure Details Consent: Risks of procedure as well as the alternatives and risks of each were explained to the (patient/caregiver).  Consent for procedure obtained. Time Out: Verified patient identification, verified procedure, site/side was marked, verified correct patient position, special equipment/implants available, medications/allergies/relevent history reviewed, required imaging and test results available.  Performed  Maximum sterile technique was used including antiseptics, cap, gloves, gown, hand hygiene, mask and sheet. Skin prep: Chlorhexidine; local anesthetic administered A antimicrobial bonded/coated triple lumen catheter was placed in the left internal jugular vein using the Seldinger technique.  Evaluation Blood flow good Complications: No apparent complications Patient did tolerate procedure well. Chest X-ray ordered to verify placement.  CXR: pending.  Procedure performed under direct ultrasound guidance for real time vessel cannulation.      Montey Hora, [facility] Pager: [phone]  or [phone] 01/19/2016, 2:58 AM

## 2016-01-19 NOTE — Progress Notes (Signed)
Wasted 200 cc of fentanyl in sink. Witnessed by Caro Hight RN BSN

## 2016-01-19 NOTE — Progress Notes (Signed)
Orthopedic Tech Progress Note Patient Details:  Russell Gonzales 07-Dec-1950 [id number]  Patient ID: Russell Gonzales, male   DOB: 1950/11/04, 64 y.o.   MRN: [id number]   Russell Gonzales 01/19/2016, 9:14 AM Called in bio-tech brace order; spoke with Colletta [place]

## 2016-01-19 NOTE — Care Management Important Message (Signed)
Important Message  Patient Details  Name: Russell Gonzales MRN: [id number] Date of Birth: [birth year]   Medicare Important Message Given:  Yes    Jerae Izard, Leroy Sea 01/19/2016, 2:55 PM

## 2016-01-19 NOTE — Progress Notes (Signed)
Inpatient Diabetes Program Recommendations  AACE/ADA: New Consensus Statement on Inpatient Glycemic Control (2015)  Target Ranges:  Prepandial:   less than 140 mg/dL      Peak postprandial:   less than 180 mg/dL (1-2 hours)      Critically ill patients:  140 - 180 mg/dL   Results for Russell Gonzales, Russell Gonzales (MRN [id number]) as of 01/19/2016 09:25  Ref. Range 01/18/2016 09:43 01/18/2016 17:28 01/18/2016 21:00 01/19/2016 02:35 01/19/2016 06:09  Glucose Latest Ref Range: 65 - 99 mg/dL 151 (H) 230 (H) 348 (H) 402 (H) 311 (H)   Inpatient Diabetes Program Recommendations:  No known hx DM. Reviewed CBGs post steroids. Please consider Novolog correction sensitive 0-9 q 4 hrs. While NPO and A1c to determine prehospital glycemic control.  Thank you, Nani Gasser. Siddhanth Denk, RN, MSN, CDE Inpatient Glycemic Control Team Team Pager [phone] (8am-5pm) 01/19/2016 9:43 AM

## 2016-01-19 NOTE — Procedures (Addendum)
Extubation Procedure Note  Patient Details:   Name: Russell Gonzales DOB: 1951/01/15 MRN: [id number]   Airway Documentation:   + air leak test prior to extubation.  Evaluation  O2 sats: stable throughout Complications: No apparent complications Patient did tolerate procedure well. Bilateral Breath Sounds: Clear, Diminished   Yes, pt able to speak.  No distress noted, no stridor noted.  VSS, sat 98% on 3 lpm [place].    Lenna Sciara 01/19/2016, 11:57 AM

## 2016-01-19 NOTE — Progress Notes (Signed)
Called Dr. Chase Caller in [facility] to report ABG results from respiratory therapist  PH 7.16 C02 47 P02 113 Bicarb 16  Respiratory rate on ventilator at this time set at 12,  Dr. Chase Caller ordered rate to be increased to 28, pt likely in resp and met acidosis, also placed additional orders to treat condition. See [facility]

## 2016-01-19 NOTE — Progress Notes (Signed)
Called E-link box to report critical lab value and that pt heart rate remains 115-125 after switching vasopressors per MD.

## 2016-01-19 NOTE — Progress Notes (Signed)
[facility] PCCM Chart reviewed, case discussed with Dr. Chase Caller, worsening shock overnight, procalcitonin up.  Will add empiric aztreonam. Roselie Awkward, MD [facility] Pager: [phone] Cell: [phone] After 3pm or if no response, call [phone]

## 2016-01-20 ENCOUNTER — Encounter (HOSPITAL_COMMUNITY): Payer: Self-pay | Admitting: Neurological Surgery

## 2016-01-20 ENCOUNTER — Inpatient Hospital Stay (HOSPITAL_COMMUNITY): Payer: PPO

## 2016-01-20 DIAGNOSIS — N179 Acute kidney failure, unspecified: Secondary | ICD-10-CM

## 2016-01-20 DIAGNOSIS — D62 Acute posthemorrhagic anemia: Secondary | ICD-10-CM

## 2016-01-20 LAB — GLUCOSE, CAPILLARY
GLUCOSE-CAPILLARY: 155 mg/dL — AB (ref 65–99)
GLUCOSE-CAPILLARY: 159 mg/dL — AB (ref 65–99)
GLUCOSE-CAPILLARY: 165 mg/dL — AB (ref 65–99)
GLUCOSE-CAPILLARY: 174 mg/dL — AB (ref 65–99)
GLUCOSE-CAPILLARY: 235 mg/dL — AB (ref 65–99)
GLUCOSE-CAPILLARY: 261 mg/dL — AB (ref 65–99)
Glucose-Capillary: 212 mg/dL — ABNORMAL HIGH (ref 65–99)

## 2016-01-20 LAB — VANCOMYCIN, TROUGH: VANCOMYCIN TR: 21 ug/mL — AB (ref 15–20)

## 2016-01-20 LAB — CBC
HCT: 21.9 % — ABNORMAL LOW (ref 39.0–52.0)
HCT: 22.1 % — ABNORMAL LOW (ref 39.0–52.0)
HEMATOCRIT: 24.6 % — AB (ref 39.0–52.0)
HEMOGLOBIN: 7.7 g/dL — AB (ref 13.0–17.0)
HEMOGLOBIN: 8.5 g/dL — AB (ref 13.0–17.0)
Hemoglobin: 7.7 g/dL — ABNORMAL LOW (ref 13.0–17.0)
MCH: 32.6 pg (ref 26.0–34.0)
MCH: 32.5 pg (ref 26.0–34.0)
MCH: 32.1 pg (ref 26.0–34.0)
MCHC: 35.2 g/dL (ref 30.0–36.0)
MCHC: 34.8 g/dL (ref 30.0–36.0)
MCHC: 34.6 g/dL (ref 30.0–36.0)
MCV: 92.8 fL (ref 78.0–100.0)
MCV: 93.2 fL (ref 78.0–100.0)
MCV: 92.8 fL (ref 78.0–100.0)
PLATELETS: 51 10*3/uL — AB (ref 150–400)
Platelets: 52 10*3/uL — ABNORMAL LOW (ref 150–400)
Platelets: 53 10*3/uL — ABNORMAL LOW (ref 150–400)
RBC: 2.36 MIL/uL — ABNORMAL LOW (ref 4.22–5.81)
RBC: 2.37 MIL/uL — AB (ref 4.22–5.81)
RBC: 2.65 MIL/uL — ABNORMAL LOW (ref 4.22–5.81)
RDW: 12.7 % (ref 11.5–15.5)
RDW: 12.8 % (ref 11.5–15.5)
RDW: 12.7 % (ref 11.5–15.5)
WBC: 12.9 10*3/uL — ABNORMAL HIGH (ref 4.0–10.5)
WBC: 13.2 10*3/uL — ABNORMAL HIGH (ref 4.0–10.5)
WBC: 13.9 10*3/uL — ABNORMAL HIGH (ref 4.0–10.5)

## 2016-01-20 LAB — TROPONIN I
TROPONIN I: 0.08 ng/mL — AB (ref <0.03)
Troponin I: 0.13 ng/mL (ref <0.03)

## 2016-01-20 LAB — BASIC METABOLIC PANEL
ANION GAP: 6 (ref 5–15)
Anion gap: 6 (ref 5–15)
BUN: 41 mg/dL — AB (ref 6–20)
BUN: 41 mg/dL — ABNORMAL HIGH (ref 6–20)
CALCIUM: 6.2 mg/dL — AB (ref 8.9–10.3)
CALCIUM: 6 mg/dL — AB (ref 8.9–10.3)
CO2: 30 mmol/L (ref 22–32)
CO2: 27 mmol/L (ref 22–32)
CREATININE: 2.3 mg/dL — AB (ref 0.61–1.24)
CREATININE: 2.49 mg/dL — AB (ref 0.61–1.24)
Chloride: 97 mmol/L — ABNORMAL LOW (ref 101–111)
Chloride: 98 mmol/L — ABNORMAL LOW (ref 101–111)
GFR calc Af Amer: 33 mL/min — ABNORMAL LOW (ref >60)
GFR calc Af Amer: 30 mL/min — ABNORMAL LOW (ref >60)
GFR calc non Af Amer: 28 mL/min — ABNORMAL LOW (ref >60)
GFR calc non Af Amer: 26 mL/min — ABNORMAL LOW (ref >60)
GLUCOSE: 241 mg/dL — AB (ref 65–99)
GLUCOSE: 204 mg/dL — AB (ref 65–99)
Potassium: 3.8 mmol/L (ref 3.5–5.1)
Potassium: 3.3 mmol/L — ABNORMAL LOW (ref 3.5–5.1)
Sodium: 133 mmol/L — ABNORMAL LOW (ref 135–145)
Sodium: 131 mmol/L — ABNORMAL LOW (ref 135–145)

## 2016-01-20 LAB — PHOSPHORUS: Phosphorus: 3.7 mg/dL (ref 2.5–4.6)

## 2016-01-20 LAB — LACTIC ACID, PLASMA: LACTIC ACID, VENOUS: 1.5 mmol/L (ref 0.5–1.9)

## 2016-01-20 LAB — PROTIME-INR
INR: 1.4
PROTHROMBIN TIME: 17.3 s — AB (ref 11.4–15.2)

## 2016-01-20 LAB — PREPARE RBC (CROSSMATCH)

## 2016-01-20 LAB — PROCALCITONIN: Procalcitonin: 3.95 ng/mL

## 2016-01-20 LAB — MAGNESIUM: MAGNESIUM: 2 mg/dL (ref 1.7–2.4)

## 2016-01-20 LAB — APTT: aPTT: 29 seconds (ref 24–36)

## 2016-01-20 MED ORDER — FAMOTIDINE 20 MG PO TABS
20.0000 mg | ORAL_TABLET | Freq: Two times a day (BID) | ORAL | Status: DC
  Administered 2016-01-20 – 2016-01-28 (×16): 20 mg via ORAL
  Filled 2016-01-20 (×9): qty 1
  Filled 2016-01-20: qty 2
  Filled 2016-01-20 (×6): qty 1

## 2016-01-20 MED ORDER — VANCOMYCIN HCL 10 G IV SOLR
1500.0000 mg | INTRAVENOUS | Status: DC
  Filled 2016-01-20: qty 1500

## 2016-01-20 MED ORDER — VANCOMYCIN HCL 10 G IV SOLR
1250.0000 mg | INTRAVENOUS | Status: DC
  Administered 2016-01-20: 1250 mg via INTRAVENOUS
  Filled 2016-01-20 (×2): qty 1250

## 2016-01-20 MED ORDER — OXYCODONE-ACETAMINOPHEN 5-325 MG PO TABS
1.0000 | ORAL_TABLET | ORAL | Status: DC | PRN
  Administered 2016-01-20: 1 via ORAL
  Administered 2016-01-20 – 2016-01-21 (×6): 2 via ORAL
  Administered 2016-01-21: 1 via ORAL
  Administered 2016-01-21 – 2016-01-22 (×4): 2 via ORAL
  Administered 2016-01-22: 1 via ORAL
  Administered 2016-01-22 (×2): 2 via ORAL
  Administered 2016-01-22: 1 via ORAL
  Administered 2016-01-22 – 2016-01-23 (×6): 2 via ORAL
  Administered 2016-01-23: 1 via ORAL
  Administered 2016-01-24 (×5): 2 via ORAL
  Filled 2016-01-20 (×3): qty 2
  Filled 2016-01-20: qty 1
  Filled 2016-01-20 (×2): qty 2
  Filled 2016-01-20: qty 1
  Filled 2016-01-20 (×21): qty 2

## 2016-01-20 MED ORDER — SODIUM CHLORIDE 0.9 % IV SOLN
2.0000 g | Freq: Once | INTRAVENOUS | Status: AC
  Administered 2016-01-20: 2 g via INTRAVENOUS
  Filled 2016-01-20: qty 20

## 2016-01-20 MED ORDER — SODIUM CHLORIDE 0.9 % IV SOLN
Freq: Once | INTRAVENOUS | Status: AC
  Administered 2016-01-20: 14:00:00 via INTRAVENOUS

## 2016-01-20 MED ORDER — SODIUM CHLORIDE 0.9 % IV SOLN
500.0000 mg | Freq: Three times a day (TID) | INTRAVENOUS | Status: DC
  Administered 2016-01-20: 500 mg via INTRAVENOUS
  Filled 2016-01-20 (×3): qty 500

## 2016-01-20 MED ORDER — FLUDROCORTISONE ACETATE 0.1 MG PO TABS
0.1000 mg | ORAL_TABLET | Freq: Every day | ORAL | Status: DC
  Administered 2016-01-20: 0.1 mg via ORAL
  Filled 2016-01-20: qty 1

## 2016-01-20 MED ORDER — SODIUM CHLORIDE 0.9 % IV SOLN
250.0000 mg | Freq: Four times a day (QID) | INTRAVENOUS | Status: DC
  Administered 2016-01-20 – 2016-01-21 (×3): 250 mg via INTRAVENOUS
  Filled 2016-01-20 (×5): qty 250

## 2016-01-20 MED ORDER — FLUDROCORTISONE ACETATE 0.1 MG PO TABS
0.1000 mg | ORAL_TABLET | Freq: Every day | ORAL | Status: DC
  Filled 2016-01-20: qty 1

## 2016-01-20 MED ORDER — DIAZEPAM 5 MG PO TABS
5.0000 mg | ORAL_TABLET | Freq: Three times a day (TID) | ORAL | Status: DC | PRN
  Administered 2016-01-20 – 2016-01-27 (×12): 5 mg via ORAL
  Filled 2016-01-20 (×14): qty 1

## 2016-01-20 NOTE — Progress Notes (Signed)
Inpatient Diabetes Program Recommendations  AACE/ADA: New Consensus Statement on Inpatient Glycemic Control (2015)  Target Ranges:  Prepandial:   less than 140 mg/dL      Peak postprandial:   less than 180 mg/dL (1-2 hours)      Critically ill patients:  140 - 180 mg/dL  Results for Russell Gonzales, Russell Gonzales (MRN [id number]) as of 01/20/2016 11:14  Ref. Range 01/19/2016 09:39 01/19/2016 11:26 01/19/2016 15:29 01/19/2016 19:42 01/19/2016 23:51 01/20/2016 03:29 01/20/2016 08:07  Glucose-Capillary Latest Ref Range: 65 - 99 mg/dL 312 (H) 269 (H) 223 (H) 171 (H) 155 (H) 174 (H) 261 (H)   Results for Russell Gonzales, Russell Gonzales (MRN [id number]) as of 01/20/2016 11:14  Ref. Range 01/18/2016 09:43 01/18/2016 17:28 01/18/2016 21:00 01/19/2016 02:35 01/19/2016 06:09 01/19/2016 11:00 01/19/2016 15:00 01/20/2016 09:00  Glucose Latest Ref Range: 65 - 99 mg/dL 151 (H) 230 (H) 348 (H) 402 (H) 311 (H) 282 (H) 218 (H) 241 (H)   Results for Russell Gonzales, Russell Gonzales (MRN [id number]) as of 01/20/2016 11:14  Ref. Range 07/01/2015 09:53  Hemoglobin A1C Unknown 6.2   Review of Glycemic Control  Diabetes history: No Outpatient Diabetes medications: NA Current orders for Inpatient glycemic control: Novolog 0-15 unis Q4H  Inpatient Diabetes Program Recommendations: HgbA1C: May want to consider ordering an A1C to evaluate glycemic control over the past 2-3 months. However, likely will not be accurate due to low hemoglobin. Recommend patient follow up with PCP regarding glycemic control. Insulin-Basal: May want to consider ordering low dose basal insulin. Recommend starting with Levemir 10 units Q24H.  NOTE: In reviewing the chart, noted patient does not have a documented history of diabetes and last A1C in the chart was 6.2% on 07/01/15 (per ADA would meet prediabetes criteria). Also note patient is taking Decadron 4 mg BID as an outpatient which has the potential to cause steroid induced diabetes.  Thanks, Barnie Alderman, RN, MSN, CDE Diabetes Coordinator Inpatient Diabetes  Program [phone] (Team Pager from [place] to [place]) [phone] ([facility] office) [phone] [facility] office) [phone] [facility] office)

## 2016-01-20 NOTE — Progress Notes (Signed)
Pt complaining of pain medicine not working efficiently and feeling like he was having spasms and unable to relax.   Called the on call MD to request something for relaxation and muscle spasms, he gave a verbal order accordingly. See MAR  Zoanne Newill GARNER

## 2016-01-20 NOTE — Progress Notes (Addendum)
Pharmacy Antibiotic Note  Russell Gonzales is a 65 y.o. male admitted on 01/18/2016 for back surgery.  On vanc # 3 & aztreonam # 2 for surgical prophylaxis and presumed sepsis. WBC  21.3 >>12.9.  Lactate 7.3>2.9.1.5.  Afebrile. Extubated and off pressors.  Still has back drain in place.  Admission creat was 0.89 and climbed to a high of 2.47 yesterday and is 2.3 today.  UOP was poor yesterday but has picked up overnight. I/O 5947/2875 with + 3 L in.   Addendum: Dr. Ellene Route OK with changing aztreonam to imipenem.  CCM had rec DC abx if OK with NS.    Plan: dc aztreonam, start imipenem 500 mg IV q8h Stop scheduled vancomycin 1 gm q12 and check a VT tonight at 1700 to assess dose   Height: 5\' 6"  (167.6 cm) Weight: 241 lb 10 oz (109.6 kg) IBW/kg (Calculated) : 63.8  Temp (24hrs), Avg:98.1 F (36.7 C), Min:97.5 F (36.4 C), Max:98.5 F (36.9 C)   Recent Labs Lab 01/18/16 2010  01/19/16 0235 01/19/16 0237 01/19/16 0516 01/19/16 0609 01/19/16 1100 01/19/16 1146 01/19/16 1500 01/20/16 0031 01/20/16 0900 01/20/16 1015  WBC 23.3*  --  24.5*  --  21.3*  --   --   --   --   --  12.9* 13.2*  CREATININE  --   < > 1.86*  --   --  1.93* 2.21*  --  2.47*  --  2.30*  --   LATICACIDVEN  --   --   --  7.3*  --   --   --  2.91*  --  1.5  --   --   < > = values in this interval not displayed.  Estimated Creatinine Clearance: 37.7 mL/min (by C-G formula based on SCr of 2.3 mg/dL).    Allergies  Allergen Reactions  . Aspirin Hives  . Bc Fast Pain [Aspirin-Caffeine] Hives  . Ancef [Cefazolin] Rash    RASH  . Tape Rash    Surgical Tape- 2009ish  Back surgery - broke out- rash    Antimicrobials this admission: Azactam 8/9 >>8/10 Imipenem 8/10>> Vanc 8/8 >>  Dose adjustments this admission: Check VT 8/10  Microbiology results: 8/8 MRSA PCR neg  Thank you for allowing pharmacy to be a part of this patient's care.  Eudelia Bunch, Pharm.D. [phone] 01/20/2016 10:58 AM

## 2016-01-20 NOTE — Progress Notes (Signed)
[facility] Progress Note Patient Name: Russell Gonzales DOB: 07-03-1950 MRN: [id number]   Date of Service  01/20/2016  HPI/Events of Note  Ca++ = 6.0 and albumin = 2.9. Ca++ corrects to 6.88. PO4--- = 3.7.  eICU Interventions  Will replete Ca++.      Intervention Category Intermediate Interventions: Electrolyte abnormality - evaluation and management  Lysle Dingwall 01/20/2016, 6:33 PM

## 2016-01-20 NOTE — Progress Notes (Signed)
Physical Therapy Treatment Patient Details Name: Russell Gonzales MRN: [id number] DOB: 1950/08/11 Today's Date: 01/20/2016    History of Present Illness 65 y.o. male admitted to [facility] on 01/18/16 for elective decompressive leminectomies from T10-L1, fusion T7-L2.  Pt with significant PMXh of SBO, HTN, low Fe, HTN, throacic laminectomy 08/2015, L1-S1 fusion 04/2008, L knee arthroscopy, ACDF 05/2008, and L ankle fx surgery.    PT Comments    Pt is progressing slowly with his mobility.  He was able to get OOB to chair with two person assist and RW today.  Less lightheaded after one unit of blood.  Pt did report he felt like his right leg was weaker today than yesterday and functionally he cannot lift it as well as the left.  Pt would benefit from post acute rehab depending on progress.    Follow Up Recommendations  CIR     Equipment Recommendations  None recommended by PT    Recommendations for Other Services Rehab consult     Precautions / Restrictions Precautions Precautions: Fall;Back Required Braces or Orthoses: Spinal Brace Spinal Brace: Thoracolumbosacral orthotic;Applied in sitting position    Mobility  Bed Mobility Overal bed mobility: Needs Assistance Bed Mobility: Rolling;Sidelying to Sit Rolling: Mod assist Sidelying to sit: +2 for physical assistance;Mod assist       General bed mobility comments: Mod assist to help pt bend knees, remind him of log roll and help him progress his hips and trunk all the way to side lying.  Assist needed (although minimal ) to help progress bil legs over EOB and then most support provided at trunk to power up to sitting EOB.   Transfers Overall transfer level: Needs assistance   Transfers: Sit to/from Stand Sit to Stand: +2 physical assistance;Mod assist         General transfer comment: Two person mod assist to stabilize RW and help pt power up over weak legs.   Ambulation/Gait Ambulation/Gait assistance: +2 safety/equipment;Mod  assist Ambulation Distance (Feet): 10 Feet Assistive device: Rolling walker (2 wheeled) Gait Pattern/deviations: Step-to pattern     General Gait Details: Pt took forward and backward steps at EOB several times before turing to sit in the recliner chair, legs weak and mildly buckling.            Balance Overall balance assessment: Needs assistance Sitting-balance support: Feet supported;Bilateral upper extremity supported Sitting balance-Leahy Scale: Fair     Standing balance support: Bilateral upper extremity supported Standing balance-Leahy Scale: Poor                      Cognition Arousal/Alertness: Awake/alert Behavior During Therapy: WFL for tasks assessed/performed Overall Cognitive Status: Within Functional Limits for tasks assessed                             Pertinent Vitals/Pain Pain Assessment: 0-10 Pain Score: 8  Pain Location: upper back and left front throacic rib Pain Descriptors / Indicators: Aching;Burning Pain Intervention(s): Limited activity within patient's tolerance;Monitored during session;Repositioned           PT Goals (current goals can now be found in the care plan section) Acute Rehab PT Goals Patient Stated Goal: to get his legs stronger Progress towards PT goals: Progressing toward goals    Frequency  Min 5X/week    PT Plan Current plan remains appropriate       End of Session Equipment Utilized During Treatment: Back brace  Activity Tolerance: Patient limited by pain Patient left: in chair;with call bell/phone within reach     Time: [id number] PT Time Calculation (min) (ACUTE ONLY): 20 min  Charges:  $Therapeutic Activity: 8-22 mins                      Lillee Mooneyhan B. [place], [place], DPT [phone]   01/20/2016, 4:17 PM

## 2016-01-20 NOTE — Progress Notes (Signed)
CRITICAL VALUE ALERT  Critical value received:  Calcium 6.2  Date of notification: 01/20/2016  Time of notification:  0930  Critical value read back:Yes.    Nurse who received alert:  Gerline Legacy RN  MD notified (1st page):  Elsworth Soho was on the unit  Time of first page: 0930  MD notified (2nd page):  Time of second page:  Responding MD:  Elsworth Soho  Time MD responded: [place] on the floor made him aware. No interventions

## 2016-01-20 NOTE — Progress Notes (Addendum)
Pharmacy Antibiotic Note Russell Gonzales is a 65 y.o. male admitted on 01/18/2016 for back surgery. Currently on imipinem and vancomycin for coverage of sepsis.   VT obtained this evening was 21. Helpful in determining when next dose due. Will adjust vancomycin to 1250 mg every 24 hours.   Plan:  1. Adjust imipenem to 250 mg IV q6h 2. Adjust vancomycin to 1250 mg every 24 hours 3. F/u renal fxn trend    Height: 5\' 6"  (167.6 cm) Weight: 241 lb 10 oz (109.6 kg) IBW/kg (Calculated) : 63.8  Temp (24hrs), Avg:98 F (36.7 C), Min:97.5 F (36.4 C), Max:98.3 F (36.8 C)   Recent Labs Lab 01/19/16 0235 01/19/16 0237 01/19/16 0516 01/19/16 0609 01/19/16 1100 01/19/16 1146 01/19/16 1500 01/20/16 0031 01/20/16 0900 01/20/16 1015 01/20/16 1701 01/20/16 1702  WBC 24.5*  --  21.3*  --   --   --   --   --  12.9* 13.2*  --  13.9*  CREATININE 1.86*  --   --  1.93* 2.21*  --  2.47*  --  2.30*  --   --  2.49*  LATICACIDVEN  --  7.3*  --   --   --  2.91*  --  1.5  --   --   --   --   VANCOTROUGH  --   --   --   --   --   --   --   --   --   --  21*  --     Estimated Creatinine Clearance: 34.8 mL/min (by C-G formula based on SCr of 2.49 mg/dL).    Allergies  Allergen Reactions  . Aspirin Hives  . Bc Fast Pain [Aspirin-Caffeine] Hives  . Ancef [Cefazolin] Rash    RASH  . Tape Rash    Surgical Tape- 2009ish  Back surgery - broke out- rash    Antimicrobials this admission: Azactam 8/9 >>8/10  Imipenem 8/10>>  Vanc 8/8 >>  Dose adjustments this admission: 8/10 VT: dose adjusted to 1250 mg Q 24 hours.   Microbiology results: 8/8 MRSA PCR neg  Thank you for allowing pharmacy to be a part of this patient's care.   Vincenza Hews, PharmD, BCPS 01/20/2016, 6:50 PM Pager: [phone]

## 2016-01-20 NOTE — Progress Notes (Signed)
Rehab Admissions Coordinator Note:  Patient was screened by [name] for appropriateness for an Inpatient Acute Rehab Consult.  At this time, I am not sure that insurance would authorize an acute inpatient rehab admission given current diagnosis.  However, if MD wants to pursue CIR, will need order for rehab consult and an order for an OT evaluation.  Call me for questions.  Jodell Cipro M 01/20/2016, 10:33 AM  I can be reached at [phone].

## 2016-01-20 NOTE — Progress Notes (Signed)
PULMONARY / CRITICAL CARE MEDICINE   Name: Russell Gonzales MRN: [id number] DOB: [birth year]    ADMISSION DATE:  01/18/2016 CONSULTATION DATE:  8/8  REFERRING MD:  Dr. Ellene Route  CHIEF COMPLAINT:  Back surgery  HISTORY OF PRESENT ILLNESS:  65 year old male with past medical history as below, which is significant for chronic back pain, fatty liver disease, hypertension, hyperlipidemia, and GERD. 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 He presented again the end of July 2017 with complaints of leg pain and numbness. He underwent repeat myelography which demonstrated advanced spondylitic stenosis at multiple levels across the thoracic-lumbar junction and he has been recommended for surgical decompression and stabilization from T8 down to L2. 01/18/2016 he presented for elective procedure which was without complication. Postoperatively he was sent to the ICU for recovery and PCCM has been asked to evaluate.  SUBJECTIVE:  Extubated yesterday.  Still c/o BLE weakness R>L.  Denies SOB.   VITAL SIGNS: BP 109/73   Pulse 77   Temp 97.5 F (36.4 C) (Oral)   Resp (!) 8   Ht 5\' 6"  (1.676 m)   Wt 109.6 kg (241 lb 10 oz)   SpO2 93%   BMI 39.00 kg/m   HEMODYNAMICS: CVP:  [2 mmHg-8 mmHg] 5 mmHg   INTAKE / OUTPUT: I/O last 3 completed shifts: In: 10967.1 [I.V.:7767.1; IV Piggyback:3200] Out: [id number] [Urine:2620; Drains:725]  PHYSICAL EXAMINATION: General:  Pleasant, obese male, NAD  Neuro:  Awake and alert, R>L BLE weakness  HEENT:  [place]/AT, PERRL, no JVD Cardiovascular: RRR, no MRG, on levo drip Lungs: resps even on labored on RA, slightly diminished bases  Abdomen:  Soft, non-distended Musculoskeletal:  No acute deformity Skin:  Grossly intact. No pallor  LABS:  BMET  Recent Labs Lab 01/19/16 1100 01/19/16 1500 01/20/16 0900  NA 133* 134* 133*  K 4.8 4.3 3.8  CL 101 103  97*  CO2 22 25 30   BUN 39* 41* 41*  CREATININE 2.21* 2.47* 2.30*  GLUCOSE 282* 218* 241*    Electrolytes  Recent Labs Lab 01/19/16 0235 01/19/16 0609 01/19/16 1100 01/19/16 1500 01/20/16 0900  CALCIUM 7.6* 7.0* 6.8* 6.5* 6.2*  MG 1.9 1.6*  --   --  2.0  PHOS 7.1* 4.6  --   --  3.7    CBC  Recent Labs Lab 01/19/16 0235 01/19/16 0516 01/20/16 0900  WBC 24.5* 21.3* 12.9*  HGB 11.9* 11.4* 7.7*  HCT 35.8* 32.8* 21.9*  PLT 142* 131* PENDING    Coag's  Recent Labs Lab 01/18/16 1850 01/18/16 2010 01/19/16 0302 01/20/16 0900  APTT 28  --   --  29  INR 1.37 1.47 1.73 1.40    Sepsis Markers  Recent Labs Lab 01/19/16 0237 01/19/16 0454 01/19/16 1146 01/20/16 0031  LATICACIDVEN 7.3*  --  2.91* 1.5  PROCALCITON  --  3.89  --   --     ABG  Recent Labs Lab 01/19/16 0334 01/19/16 0606 01/19/16 1015  PHART 7.162* 7.430 7.348*  PCO2ART 47.1* 26.3* 39.0  PO2ART 113* 112* 89.0    Liver Enzymes  Recent Labs Lab 01/18/16 0943 01/19/16 0235  AST 27 229*  ALT 79* 278*  ALKPHOS 66 42  BILITOT 1.1 1.9*  ALBUMIN 3.7 2.9*    Cardiac Enzymes  Recent Labs Lab 01/19/16 1500 01/19/16 2359 01/20/16 0900  TROPONINI 0.16* 0.13* 0.08*    Glucose  Recent  Labs Lab 01/19/16 1126 01/19/16 1529 01/19/16 1942 01/19/16 2351 01/20/16 0329 01/20/16 0807  GLUCAP 269* 223* 171* 155* 174* 261*    Imaging Dg Chest Port 1 View  Result Date: 01/20/2016 CLINICAL DATA:  Extubation. EXAM: PORTABLE CHEST 1 VIEW COMPARISON:  01/19/2016. FINDINGS: Interim extubation and removal of NG tube. Left IJ line in stable position. Stable cardiomegaly. Low lung volumes. Interim slight improvement of left base atelectasis and/or infiltrate and left pleural effusion. No pneumothorax. Surgical staples noted over the chest. Prior thoracolumbar spine fusion . IMPRESSION: 1. Interim extubation and removal of NG tube. Left IJ line in stable position. 2. Low lung volumes with interim  improvement of left lower lobe atelectasis and/or infiltrate and left pleural effusion. 3. Stable cardiomegaly. Electronically Signed   By: Marcello Moores  Register   On: 01/20/2016 07:37     STUDIES:  CULTURES:  ANTIBIOTICS: 8/8 Azactam>> 8/8 vanc>>  SIGNIFICANT EVENTS: 8/8 to OR for elective decompression 8/9- arf  LINES/TUBES: ETT 8/8 >8/10 R rad art line 8/8 >8/10 Line left IJ 8/8>>>  DISCUSSION: 65 year old male presented 8/8 for elective surgical decompression and spinal fixation. On vent in shock post op. Almost 5L blood loss. PCCM to assist with medical management  ASSESSMENT / PLAN:  PULMONARY A: Inability to protect airway in post-operative setting - resolved.   P:   Pulmonary hygiene - add IS  Mobilize as able   CARDIOVASCULAR A:  Shock, intraoperative likely hypovolemic/vs hemorrhagic 4751mL blood loss estimated (s/p 1875 cell saver, 2 units FFP) H/o HTN, HLD  P:  MAP goal > 2mmHg Levo for MAP goal Assess blood count for transfusion need Holding home meds  RENAL Metabolic acidosis    Hyperkalemia(resolved) AKI - slowly improving, good UOP  P:   Repeat BMP Correct electrolytes as indicated Follow creatine D/c bicarb  Follow UOP  if renal failure worsens assess urine eos for interstitial nephritis from abx reaction  GASTROINTESTINAL A:   GERD P:   Pepcid for SUP PO diet   HEMATOLOGIC A:   ABLA Hematuria(resolved) ?DIC Anemia  P:  Repeat CBC now  Transfuse per ICU guidelines DIC panel d dimer 13.2 , fibrogen 162, inr 1.73, but he is post 4700 cc blood loss with cell saver transfusion. Will continue to monitor  INFECTIOUS A:   Surgical prophylaxis Presumed sepsis P:   Azactam/ vanc per NS  ENDOCRINE Suspect adrenal insufficiency  DM P:   SSI  Stress steroids, florinef   NEUROLOGIC A:   Severe spinal stenosis status post Decompressive laminectomies from T10-L1, pedicle screw fixation T7 T8 T9 and left T10 with rods  stabilization from T7-L2. Awake and alert 8/9  P:   RASS goal: 0 F/u CT 8/10 PRN fentanyl Per Neurosurgery  FAMILY  - Updates: pt updated 25/10  - Inter-disciplinary family meet or Palliative Care meeting due by:  8/15   Nickolas Madrid, NP 01/20/2016  10:18 AM Pager: [phone] or [phone]

## 2016-01-20 NOTE — Progress Notes (Signed)
Patient ID: Russell Gonzales, male   DOB: 1950-11-22, 65 y.o.   MRN: [id number] Vital signs are stable Patient complains of left chest wall pain Motor function appears to be doing reasonably well Hemovac drain has moderate output I will obtain a CT of the thoracolumbar spine to check placement of his fixation

## 2016-01-20 NOTE — Progress Notes (Signed)
CRITICAL VALUE ALERT  Critical value received:  Calcium 6.0  Date of notification:  01/20/2016  Time of notification: 1830  Critical value read back:Yes.    Nurse who received alert:  Gerline Legacy RN  MD notified (1st page): [place]  Time of first page:  1830  MD notified (2nd page):  Time of second page:  Responding MD:  Oletta Darter  Time MD responded:  [phone]

## 2016-01-21 DIAGNOSIS — J9600 Acute respiratory failure, unspecified whether with hypoxia or hypercapnia: Secondary | ICD-10-CM

## 2016-01-21 LAB — BASIC METABOLIC PANEL
ANION GAP: 6 (ref 5–15)
BUN: 37 mg/dL — ABNORMAL HIGH (ref 6–20)
CALCIUM: 6.6 mg/dL — AB (ref 8.9–10.3)
CO2: 28 mmol/L (ref 22–32)
Chloride: 100 mmol/L — ABNORMAL LOW (ref 101–111)
Creatinine, Ser: 2.2 mg/dL — ABNORMAL HIGH (ref 0.61–1.24)
GFR, EST AFRICAN AMERICAN: 35 mL/min — AB (ref >60)
GFR, EST NON AFRICAN AMERICAN: 30 mL/min — AB (ref >60)
GLUCOSE: 124 mg/dL — AB (ref 65–99)
POTASSIUM: 3.2 mmol/L — AB (ref 3.5–5.1)
SODIUM: 134 mmol/L — AB (ref 135–145)

## 2016-01-21 LAB — PROCALCITONIN: PROCALCITONIN: 2.53 ng/mL

## 2016-01-21 LAB — CBC
HCT: 23.2 % — ABNORMAL LOW (ref 39.0–52.0)
Hemoglobin: 8 g/dL — ABNORMAL LOW (ref 13.0–17.0)
MCH: 32.1 pg (ref 26.0–34.0)
MCHC: 34.5 g/dL (ref 30.0–36.0)
MCV: 93.2 fL (ref 78.0–100.0)
PLATELETS: 42 10*3/uL — AB (ref 150–400)
RBC: 2.49 MIL/uL — AB (ref 4.22–5.81)
RDW: 13 % (ref 11.5–15.5)
WBC: 9.3 10*3/uL (ref 4.0–10.5)

## 2016-01-21 LAB — GLUCOSE, CAPILLARY
GLUCOSE-CAPILLARY: 142 mg/dL — AB (ref 65–99)
GLUCOSE-CAPILLARY: 108 mg/dL — AB (ref 65–99)
GLUCOSE-CAPILLARY: 121 mg/dL — AB (ref 65–99)
GLUCOSE-CAPILLARY: 123 mg/dL — AB (ref 65–99)
Glucose-Capillary: 101 mg/dL — ABNORMAL HIGH (ref 65–99)
Glucose-Capillary: 119 mg/dL — ABNORMAL HIGH (ref 65–99)

## 2016-01-21 NOTE — Progress Notes (Signed)
Occupational Therapy Evaluation Patient Details Name: Russell Gonzales MRN: [id number] DOB: [birth year] Today's Date: 01/21/2016    History of Present Illness 65 y.o. male admitted to [facility] on 01/18/16 for elective decompressive leminectomies from T10-L1, fusion T7-L2.  Pt with significant PMXh of SBO, HTN, low Fe, HTN, throacic laminectomy 08/2015, L1-S1 fusion 04/2008, L knee arthroscopy, ACDF 05/2008, and L ankle fx surgery.   Clinical Impression   PTA, pt independent with mobility and wife assisted occasionally with ADL. Pt will benefit from short CIR stay to maximize functional level of independence with ADL and mobility to facilitate safe D/C home. Will follow acutely to address established goals.     Follow Up Recommendations  CIR;Supervision/Assistance - 24 hour    Equipment Recommendations  3 in 1 bedside comode    Recommendations for Other Services Rehab consult     Precautions / Restrictions Precautions Precautions: Fall;Back Precaution Booklet Issued: Yes (comment) Precaution Comments: Verbally reviewed, handout provided Required Braces or Orthoses: Spinal Brace Spinal Brace: Thoracolumbosacral orthotic;Applied in sitting position Restrictions Weight Bearing Restrictions: No      Mobility Bed Mobility Overal bed mobility: Needs Assistance Bed Mobility: Rolling;Sidelying to Sit Rolling: Min assist Sidelying to sit: Min assist       General bed mobility comments: Min assist with VCs for positioning and sequencing to roll to sidelying, min assist for trunk elevation to upright  Transfers Overall transfer level: Needs assistance   Transfers: Sit to/from Stand Sit to Stand: Min assist;+2 physical assistance         General transfer comment: two person min assist to elevate to standing with cues for hand placement and positioning    Balance Overall balance assessment: Needs assistance Sitting-balance support: Feet supported Sitting balance-Leahy Scale: Fair      Standing balance support: Bilateral upper extremity supported Standing balance-Leahy Scale: Poor Standing balance comment: heavy reliance on UE support                            ADL Overall ADL's : Needs assistance/impaired     Grooming: Set up;Supervision/safety;Sitting   Upper Body Bathing: Minimal assitance;Sitting   Lower Body Bathing: Maximal assistance;Sit to/from stand   Upper Body Dressing : Maximal assistance Upper Body Dressing Details (indicate cue type and reason): including TLSO Lower Body Dressing: Maximal assistance;Sit to/from stand               Functional mobility during ADLs: Minimal assistance;+2 for safety/equipment;Rolling walker;+2 for physical assistance General ADL Comments: Began education regarding compensatory techniques for ADL. Handout given     Vision     Perception     Praxis      Pertinent Vitals/Pain Pain Assessment: Faces Faces Pain Scale: Hurts even more Pain Location: back  Pain Descriptors / Indicators: Sore Pain Intervention(s): Limited activity within patient's tolerance;Repositioned     Hand Dominance     Extremity/Trunk Assessment Upper Extremity Assessment Upper Extremity Assessment: Overall WFL for tasks assessed   Lower Extremity Assessment Lower Extremity Assessment: Defer to PT evaluation   Cervical / Trunk Assessment Cervical / Trunk Assessment: Other exceptions (back surgery)   Communication     Cognition Arousal/Alertness: Awake/alert Behavior During Therapy: WFL for tasks assessed/performed Overall Cognitive Status: Within Functional Limits for tasks assessed                     General Comments       Exercises  Shoulder Instructions      Home Living Family/patient expects to be discharged to:: Private residence Living Arrangements: Spouse/significant other Available Help at Discharge: Family;Available PRN/intermittently Type of Home: Mobile home Home Access:  Stairs to enter Entrance Stairs-Number of Steps: 3 Entrance Stairs-Rails: Left (wall on the right) Home Layout: One level     Bathroom Shower/Tub: Teacher, early years/pre: Handicapped height Bathroom Accessibility: No   Home Equipment: Environmental consultant - 4 wheels;Cane - single point;Grab bars - tub/shower;Shower seat          Prior Functioning/Environment Level of Independence: Independent with assistive device(s)        Comments: increased difficutly ambulating just prior to admission with dragging L leg (wife assisted wtih socks)    OT Diagnosis: Generalized weakness;Acute pain   OT Problem List: Decreased strength;Decreased range of motion;Decreased activity tolerance;Impaired balance (sitting and/or standing);Decreased safety awareness;Decreased knowledge of use of DME or AE;Decreased knowledge of precautions;Cardiopulmonary status limiting activity;Impaired sensation;Obesity;Impaired UE functional use;Pain;Increased edema   OT Treatment/Interventions: Self-care/ADL training;DME and/or AE instruction;Therapeutic exercise;Therapeutic activities;Patient/family education;Balance training    OT Goals(Current goals can be found in the care plan section) Acute Rehab OT Goals Patient Stated Goal: to return home OT Goal Formulation: With patient Time For Goal Achievement: 02/04/16 ADL Goals Pt Will Perform Lower Body Bathing: with caregiver independent in assisting;sit to/from stand;with adaptive equipment;with supervision Pt Will Perform Lower Body Dressing: with supervision;sit to/from stand;with adaptive equipment;with caregiver independent in assisting Pt Will Transfer to Toilet: with modified independence;ambulating;bedside commode (with caregiver independent in assisting) Pt Will Perform Toileting - Clothing Manipulation and hygiene: with supervision;with set-up;sitting/lateral leans;sit to/from stand;with adaptive equipment Additional ADL Goal #1: Pt will independently  verbalize 3/3 back precuations for ADL  OT Frequency: Min 2X/week   Barriers to D/C:            Co-evaluation PT/OT/SLP Co-Evaluation/Treatment: Yes Reason for Co-Treatment: For patient/therapist safety PT goals addressed during session: Mobility/safety with mobility OT goals addressed during session: ADL's and self-care      End of Session Equipment Utilized During Treatment: Gait belt;Rolling walker;Back brace Nurse Communication: Mobility status;Precautions  Activity Tolerance: Patient tolerated treatment well Patient left: in chair;with call bell/phone within reach;with family/visitor present   Time: 1231-1259 OT Time Calculation (min): 28 min Charges:  OT General Charges $OT Visit: 1 Procedure OT Evaluation $OT Eval Moderate Complexity: 1 Procedure G-Codes:    Carlean Crowl,HILLARY Jan 26, 2016, 2:52 PM   [facility], OTR/L  [phone] 2016/01/26

## 2016-01-21 NOTE — Progress Notes (Signed)
PULMONARY / CRITICAL CARE MEDICINE   Name: Russell Gonzales MRN: [id number] DOB: 02-08-1951    ADMISSION DATE:  01/18/2016 CONSULTATION DATE:  8/8  REFERRING MD:  Dr. Ellene Route  CHIEF COMPLAINT:  Back surgery  HISTORY OF PRESENT ILLNESS:  65 year old male with past medical history as below, which is significant for chronic back pain, fatty liver disease, hypertension, hyperlipidemia, and GERD. 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 He presented again the end of July 2017 with complaints of leg pain and numbness. He underwent repeat myelography which demonstrated advanced spondylitic stenosis at multiple levels across the thoracic-lumbar junction and he has been recommended for surgical decompression and stabilization from T8 down to L2. 01/18/2016 he presented for elective procedure which was without complication. Postoperatively he was sent to the ICU for recovery and PCCM has been asked to evaluate.  SUBJECTIVE:  Extubated 8/9 .  Still c/o BLE weakness, but states he is stronger than 8/10 .He did get out of bed 8/10.  Denies SOB. Taking PO's well.  VITAL SIGNS: BP 116/62   Pulse 86   Temp 97.4 F (36.3 C) (Oral)   Resp 12   Ht 5\' 6"  (1.676 m)   Wt 241 lb 10 oz (109.6 kg)   SpO2 95%   BMI 39.00 kg/m   HEMODYNAMICS:     INTAKE / OUTPUT: I/O last 3 completed shifts: In: 7853.5 [P.O.:1100; I.V.:5573.5; Blood:330; IV Piggyback:850] Out: [id number]; Drains:535]  PHYSICAL EXAMINATION: General:  Pleasant, obese male, NAD  Neuro:  Awake and alert, R=L BLE weakness improving HEENT:  [place]/AT, PERRL, no JVD Cardiovascular: RRR, no MRG Lungs: resps even non labored on RA, slightly diminished bases  Abdomen:  Soft, non-distended Musculoskeletal:  No acute deformity Skin:  Grossly intact. No pallor  LABS:  BMET  Recent Labs Lab 01/20/16 0900 01/20/16 1702  01/21/16 0630  NA 133* 131* 134*  K 3.8 3.3* 3.2*  CL 97* 98* 100*  CO2 30 27 28   BUN 41* 41* 37*  CREATININE 2.30* 2.49* 2.20*  GLUCOSE 241* 204* 124*    Electrolytes  Recent Labs Lab 01/19/16 0235 01/19/16 0609  01/20/16 0900 01/20/16 1702 01/21/16 0630  CALCIUM 7.6* 7.0*  < > 6.2* 6.0* 6.6*  MG 1.9 1.6*  --  2.0  --   --   PHOS 7.1* 4.6  --  3.7  --   --   < > = values in this interval not displayed.  CBC  Recent Labs Lab 01/20/16 1015 01/20/16 1702 01/21/16 0630  WBC 13.2* 13.9* 9.3  HGB 7.7* 8.5* 8.0*  HCT 22.1* 24.6* 23.2*  PLT 51* 53* 42*    Coag's  Recent Labs Lab 01/18/16 1850 01/18/16 2010 01/19/16 0302 01/20/16 0900  APTT 28  --   --  29  INR 1.37 1.47 1.73 1.40    Sepsis Markers  Recent Labs Lab 01/19/16 0237 01/19/16 0454 01/19/16 1146 01/20/16 0031 01/20/16 0900 01/21/16 0630  LATICACIDVEN 7.3*  --  2.91* 1.5  --   --   PROCALCITON  --  3.89  --   --  3.95 2.53    ABG  Recent Labs Lab 01/19/16 0334 01/19/16 0606 01/19/16 1015  PHART 7.162* 7.430 7.348*  PCO2ART 47.1* 26.3* 39.0  PO2ART 113* 112* 89.0    Liver Enzymes  Recent Labs Lab 01/18/16 0943 01/19/16 0235  AST 27 229*  ALT 79*  278*  ALKPHOS 66 42  BILITOT 1.1 1.9*  ALBUMIN 3.7 2.9*    Cardiac Enzymes  Recent Labs Lab 01/19/16 1500 01/19/16 2359 01/20/16 0900  TROPONINI 0.16* 0.13* 0.08*    Glucose  Recent Labs Lab 01/20/16 1155 01/20/16 1623 01/20/16 2002 01/20/16 2330 01/21/16 0335 01/21/16 0803  GLUCAP 212* 235* 159* 165* 142* 108*    Imaging Ct Thoracic Spine Wo Contrast  Result Date: 01/20/2016 CLINICAL DATA:  History of multiple back surgeries. Assess fixation hardware from T8-L2. Laminectomy 01/18/2016 EXAM: CT THORACIC SPINE WITHOUT CONTRAST TECHNIQUE: Multidetector CT imaging of the thoracic spine was performed without intravenous contrast administration. Multiplanar CT image reconstructions were also generated. COMPARISON:   01/10/2016 FINDINGS: The right T7 pedicle screw is at the superior endplate. The left pedicle screw is normally positioned. The left T8 pedicle screw is slightly laterally positioned in the pedicle and and is along the lateral cortex of the vertebral body. The right pedicle screw comes close to contacting the superior endplate and may breach the spinal canal superiorly. The left T9 pedicle screw is laterally positioned also. The right screw breaches the upper aspect of the spinal canal. The left T10 pedicle screw is well positioned. The posterior rods are intact.  No fractures are identified. The lumbar pedicle screws are stable when compared to prior examination. Small bilateral pleural effusions are noted along with bibasilar atelectasis. No paraspinal hematoma. There is a surgical drainage catheter noted in place. IMPRESSION: Thoracic and lumbar spine spinal fusion hardware as described above. Electronically Signed   By: Marijo Sanes M.D.   On: 01/20/2016 12:03     STUDIES:  CULTURES:  ANTIBIOTICS: 8/8 Azactam>>8/10 8/8 vanc>> 8/10 Primaxin>> SIGNIFICANT EVENTS: 8/8 to OR for elective decompression 8/9- arf  LINES/TUBES: ETT 8/8 >8/10 R rad art line 8/8 >8/10 Line left IJ 8/8>>>  DISCUSSION: 65 year old male presented 8/8 for elective surgical decompression and spinal fixation. On vent in shock post op. Almost 5L blood loss. PCCM to assist with medical management. Extubated and off pressors x 24 hours. Tolerating PO's  ASSESSMENT / PLAN:  PULMONARY A: Inability to protect airway in post-operative setting - resolved.   P:   Pulmonary hygiene - add IS  Mobilize as able   CARDIOVASCULAR A:  Shock, intraoperative likely hypovolemic/vs hemorrhagic 4727mL blood loss estimated (s/p 1875 cell saver, 2 units FFP) H/o HTN, HLD Levo D/c'd greater than 24 hours P:  MAP goal > 57mmHg Assess blood count for transfusion need Holding home meds  RENAL Metabolic acidosis     Hyperkalemia(resolved) AKI - slowly improving, good UOP  P:   Repeat BMP Correct electrolytes as indicated Follow creatine D/c bicarb 8/10 Follow UOP  if renal failure worsens assess urine eos for interstitial nephritis from abx reaction  GASTROINTESTINAL A:   GERD Taking PO's well P:   Pepcid for SUP PO diet tolerating well  HEMATOLOGIC A:   ABLA Hematuria(resolved) ?DIC Anemia  HGB 8.0  ( 8/11) P:  Repeat CBC  8/12 Transfuse per ICU guidelines DIC panel d dimer 13.2 , fibrogen 162, inr 1.73, but he is post 4700 cc blood loss with cell saver transfusion. Will continue to monitor  INFECTIOUS A:   Surgical prophylaxis Presumed sepsis P:   Azactam/ vanc per NS Leukocytosis resolving  Trend Fever curve  ENDOCRINE Suspect adrenal insufficiency  DM P: CBG's   SSI  Stress steroids, florinef d/c'd 8/10  NEUROLOGIC A:   Severe spinal stenosis status post Decompressive laminectomies from T10-L1,  pedicle screw fixation T7 T8 T9 and left T10 with rods stabilization from T7-L2. Awake and alert 8/9  P:   RASS goal: 0 F/u CT 8/10 PRN fentanyl Per Neurosurgery  FAMILY  - Updates: pt updated 8/11  - Inter-disciplinary family meet or Palliative Care meeting due by:  8/15   [name], AGACNP-BC [place]  [phone]

## 2016-01-21 NOTE — Progress Notes (Signed)
Patient ID: Russell Gonzales, male   DOB: February 18, 1951, 65 y.o.   MRN: [id number] vss Experiencing nausea just now Leg strength intact walked a few steps today Will continue to observe

## 2016-01-21 NOTE — Care Management Important Message (Signed)
Important Message  Patient Details  Name: Russell Gonzales MRN: [id number] Date of Birth: 06/04/1951   Medicare Important Message Given:  Yes    Loann Quill 01/21/2016, 1:28 PM

## 2016-01-21 NOTE — Progress Notes (Signed)
Physical Therapy Treatment Patient Details Name: Russell Gonzales MRN: [id number] DOB: 03/04/1951 Today's Date: 01/21/2016    History of Present Illness 65 y.o. male [facility]16 for elective decompressive leminectomies from T10-L1, fusion T7-L2.  Pt with significant PMXh of SBO, HTN, low Fe, HTN, throacic laminectomy 08/2015, L1-S1 fusion 04/2008, L knee arthroscopy, ACDF 05/2008, and L ankle fx surgery.    PT Comments    Patient making steady progress towards PT goals at this time. Improvements in activity tolerance and mobility noted however, patient continues to required increased assist for all aspects of mobility. Will continue to see and progress as tolerated. VSS throughout session.  Follow Up Recommendations  CIR     Equipment Recommendations  None recommended by PT    Recommendations for Other Services Rehab consult     Precautions / Restrictions Precautions Precautions: Fall;Back Precaution Booklet Issued: Yes (comment) Precaution Comments: Verbally reviewed, handout provided Required Braces or Orthoses: Spinal Brace Spinal Brace: Thoracolumbosacral orthotic;Applied in sitting position Restrictions Weight Bearing Restrictions: No    Mobility  Bed Mobility Overal bed mobility: Needs Assistance Bed Mobility: Rolling;Sidelying to Sit Rolling: Min assist Sidelying to sit: Min assist       General bed mobility comments: Min assist with VCs for positioning and sequencing to roll to sidelying, min assist for trunk elevation to upright  Transfers Overall transfer level: Needs assistance   Transfers: Sit to/from Stand Sit to Stand: Min assist;+2 physical assistance         General transfer comment: two person min assist to elevate to standing with cues for hand placement and positioning  Ambulation/Gait Ambulation/Gait assistance: Min assist;+2 safety/equipment (chair follow) Ambulation Distance (Feet): 60 Feet Assistive device: Rolling walker (2  wheeled) Gait Pattern/deviations: Step-through pattern;Decreased stride length;Trunk flexed Gait velocity: decreased Gait velocity interpretation: Below normal speed for age/gender General Gait Details: VCs for increased cadence and upright posture, min assist for stability. Chair follow required.   Stairs            Wheelchair Mobility    Modified Rankin (Stroke Patients Only)       Balance Overall balance assessment: Needs assistance Sitting-balance support: Feet supported Sitting balance-Leahy Scale: Fair     Standing balance support: Bilateral upper extremity supported Standing balance-Leahy Scale: Poor Standing balance comment: heavy reliance on UE support                    Cognition Arousal/Alertness: Awake/alert Behavior During Therapy: WFL for tasks assessed/performed Overall Cognitive Status: Within Functional Limits for tasks assessed                      Exercises      General Comments General comments (skin integrity, edema, etc.): some blood on dressing, drain intact      Pertinent Vitals/Pain Pain Assessment: Faces Faces Pain Scale: Hurts even more Pain Location: back Pain Descriptors / Indicators: Sore Pain Intervention(s): Monitored during session;RN gave pain meds during session    Home Living Family/patient expects to be discharged to:: Private residence Living Arrangements: Spouse/significant other Available Help at Discharge: Family;Available PRN/intermittently Type of Home: Mobile home Home Access: Stairs to enter Entrance Stairs-Rails: Left (wall on the right) Home Layout: One level Home Equipment: Walker - 4 wheels;Cane - single point;Grab bars - tub/shower;Shower seat      Prior Function            PT Goals (current goals can now be found in the  care plan section) Acute Rehab PT Goals Patient Stated Goal: to get his legs stronger PT Goal Formulation: With patient Time For Goal Achievement:  01/26/16 Potential to Achieve Goals: Good Progress towards PT goals: Progressing toward goals    Frequency  Min 5X/week    PT Plan Current plan remains appropriate    Co-evaluation PT/OT/SLP Co-Evaluation/Treatment: Yes Reason for Co-Treatment: For patient/therapist safety PT goals addressed during session: Mobility/safety with mobility       End of Session Equipment Utilized During Treatment: Back brace Activity Tolerance: Patient limited by pain Patient left: in chair;with call bell/phone within reach     Time: 1228-1257 PT Time Calculation (min) (ACUTE ONLY): 29 min  Charges:  $Gait Training: 8-22 mins                    G CodesDuncan Dull Feb 08, 2016, 2:06 PM  Alben Deeds, [place] DPT  [phone]

## 2016-01-22 LAB — CBC WITH DIFFERENTIAL/PLATELET
BASOS ABS: 0 10*3/uL (ref 0.0–0.1)
Basophils Relative: 0 %
EOS ABS: 0.1 10*3/uL (ref 0.0–0.7)
EOS PCT: 1 %
HCT: 24.1 % — ABNORMAL LOW (ref 39.0–52.0)
Hemoglobin: 8.4 g/dL — ABNORMAL LOW (ref 13.0–17.0)
LYMPHS ABS: 1.4 10*3/uL (ref 0.7–4.0)
Lymphocytes Relative: 13 %
MCH: 32.4 pg (ref 26.0–34.0)
MCHC: 34.9 g/dL (ref 30.0–36.0)
MCV: 93.1 fL (ref 78.0–100.0)
Monocytes Absolute: 0.9 10*3/uL (ref 0.1–1.0)
Monocytes Relative: 8 %
Neutro Abs: 8.1 10*3/uL — ABNORMAL HIGH (ref 1.7–7.7)
Neutrophils Relative %: 78 %
PLATELETS: 64 10*3/uL — AB (ref 150–400)
RBC: 2.59 MIL/uL — AB (ref 4.22–5.81)
RDW: 12.7 % (ref 11.5–15.5)
WBC: 10.5 10*3/uL (ref 4.0–10.5)

## 2016-01-22 LAB — TYPE AND SCREEN
ABO/RH(D): O POS
Antibody Screen: NEGATIVE
Unit division: 0
Unit division: 0

## 2016-01-22 LAB — BASIC METABOLIC PANEL
Anion gap: 6 (ref 5–15)
BUN: 32 mg/dL — AB (ref 6–20)
CALCIUM: 7.1 mg/dL — AB (ref 8.9–10.3)
CO2: 28 mmol/L (ref 22–32)
Chloride: 99 mmol/L — ABNORMAL LOW (ref 101–111)
Creatinine, Ser: 1.9 mg/dL — ABNORMAL HIGH (ref 0.61–1.24)
GFR calc Af Amer: 41 mL/min — ABNORMAL LOW (ref >60)
GFR, EST NON AFRICAN AMERICAN: 36 mL/min — AB (ref >60)
Glucose, Bld: 106 mg/dL — ABNORMAL HIGH (ref 65–99)
POTASSIUM: 3.4 mmol/L — AB (ref 3.5–5.1)
SODIUM: 133 mmol/L — AB (ref 135–145)

## 2016-01-22 LAB — GLUCOSE, CAPILLARY
Glucose-Capillary: 105 mg/dL — ABNORMAL HIGH (ref 65–99)
Glucose-Capillary: 106 mg/dL — ABNORMAL HIGH (ref 65–99)
Glucose-Capillary: 127 mg/dL — ABNORMAL HIGH (ref 65–99)
Glucose-Capillary: 109 mg/dL — ABNORMAL HIGH (ref 65–99)
Glucose-Capillary: 124 mg/dL — ABNORMAL HIGH (ref 65–99)
Glucose-Capillary: 144 mg/dL — ABNORMAL HIGH (ref 65–99)

## 2016-01-22 MED ORDER — FENTANYL 25 MCG/HR TD PT72
75.0000 ug | MEDICATED_PATCH | TRANSDERMAL | Status: DC
Start: 2016-01-22 — End: 2016-01-28
  Administered 2016-01-22 – 2016-01-28 (×3): 75 ug via TRANSDERMAL
  Filled 2016-01-22: qty 1
  Filled 2016-01-22 (×2): qty 3

## 2016-01-22 MED ORDER — POTASSIUM CHLORIDE CRYS ER 20 MEQ PO TBCR
40.0000 meq | EXTENDED_RELEASE_TABLET | Freq: Once | ORAL | Status: AC
  Administered 2016-01-22: 40 meq via ORAL
  Filled 2016-01-22: qty 2

## 2016-01-22 NOTE — Progress Notes (Signed)
Pt seen and examined. No issues overnight. Minimal nausea this am. Says he "can't get comfortable," but overall perhaps slightly improved. Walked a bit yesterday with PT.  EXAM: Temp:  [97.4 F (36.3 C)-98.1 F (36.7 C)] 98.1 F (36.7 C) (08/12 0400) Pulse Rate:  [63-86] 77 (08/12 0400) Resp:  [8-17] 15 (08/12 0400) BP: (121-148)/(71-88) 133/80 (08/12 0400) SpO2:  [95 %-100 %] 98 % (08/12 0400) Intake/Output      08/11 0701 - 08/12 0700 08/12 0701 - 08/13 0700   P.O. 780    I.V. (mL/kg) 913.9 (8.3)    Blood     IV Piggyback 100    Total Intake(mL/kg) 1793.9 (16.4)    Urine (mL/kg/hr) 3295 (1.3)    Drains 300 (0.1)    Total Output 3595     Net -1801.1           Awake, alert, oriented Speech fluent CN intact Good strength BLE Dressing intact, drain in place ~300cc x 24 hrs  LABS: Lab Results  Component Value Date   CREATININE 1.90 (H) 01/22/2016   BUN 32 (H) 01/22/2016   NA 133 (L) 01/22/2016   K 3.4 (L) 01/22/2016   CL 99 (L) 01/22/2016   CO2 28 01/22/2016   Lab Results  Component Value Date   WBC 10.5 01/22/2016   HGB 8.4 (L) 01/22/2016   HCT 24.1 (L) 01/22/2016   MCV 93.1 01/22/2016   PLT 64 (L) 01/22/2016    IMPRESSION: - 65 y.o. male POD# 4 s/p thoracolumbar decompression/extension of fusion. Recovering slowly as expected  PLAN: - Will leave drain today, likely d/c tomorrow - Cont to ambulate as tolerated - Will transfer to floor

## 2016-01-22 NOTE — Progress Notes (Signed)
Physical Therapy Treatment Patient Details Name: Russell Gonzales MRN: [id number] DOB: 12/05/1950 Today's Date: 01/22/2016    History of Present Illness 65 y.o. male admitted to [facility] on 01/18/16 for elective decompressive leminectomies from T10-L1, fusion T7-L2.  Pt with significant PMXh of SBO, HTN, low Fe, HTN, throacic laminectomy 08/2015, L1-S1 fusion 04/2008, L knee arthroscopy, ACDF 05/2008, and L ankle fx surgery.    PT Comments    Pt admitted with above diagnosis. Pt currently with functional limitations due to balance and endurance deficits. Pt was able to ambulate on unit.  Called [employer] to recheck brace as it appears to be too small.  Marya Amsler is coming this pm to replace brace as needed. Continue Acute PT.   Pt will benefit from skilled PT to increase their independence and safety with mobility to allow discharge to the venue listed below.    Follow Up Recommendations  CIR     Equipment Recommendations  None recommended by PT    Recommendations for Other Services Rehab consult     Precautions / Restrictions Precautions Precautions: Fall;Back Precaution Booklet Issued: Yes (comment) Precaution Comments: Verbally reviewed, handout provided Required Braces or Orthoses: Spinal Brace Spinal Brace: Thoracolumbosacral orthotic;Applied in sitting position Restrictions Weight Bearing Restrictions: No    Mobility  Bed Mobility Overal bed mobility: Needs Assistance Bed Mobility: Sidelying to Sit;Rolling Rolling: Min guard Sidelying to sit: Min guard       General bed mobility comments: pt demonstrated proper technique for log roll without cues or assist.   Total assist to don brace.  Brace appears too small.  Called [place], [place], and he is going to bring a larger brace this pm.  Pt and wife aware.   Pt was still able to get up with this brace.    Transfers Overall transfer level: Needs assistance Equipment used: Rolling walker (2 wheeled) Transfers: Sit to/from Stand Sit to  Stand: Min assist;+2 physical assistance         General transfer comment: two person min assist to elevate to standing with cues for hand placement and positioning  Ambulation/Gait Ambulation/Gait assistance: Min assist;+2 safety/equipment Ambulation Distance (Feet): 200 Feet Assistive device: Rolling walker (2 wheeled) Gait Pattern/deviations: Step-through pattern;Decreased stride length Gait velocity: decreased Gait velocity interpretation: Below normal speed for age/gender General Gait Details: VCs for increased cadence and upright posture, min assist for stability. Chair follow required.   Stairs            Wheelchair Mobility    Modified Rankin (Stroke Patients Only)       Balance Overall balance assessment: Needs assistance Sitting-balance support: No upper extremity supported;Feet supported Sitting balance-Leahy Scale: Fair     Standing balance support: Bilateral upper extremity supported;During functional activity Standing balance-Leahy Scale: Poor Standing balance comment: relies on UEs for balance                    Cognition Arousal/Alertness: Awake/alert Behavior During Therapy: WFL for tasks assessed/performed Overall Cognitive Status: Within Functional Limits for tasks assessed                      Exercises General Exercises - Lower Extremity Ankle Circles/Pumps: AROM;Both;10 reps;Seated Long Arc Quad: AROM;Both;10 reps;Seated    General Comments        Pertinent Vitals/Pain Pain Assessment: Faces Faces Pain Scale: Hurts even more Pain Location: back Pain Descriptors / Indicators: Aching Pain Intervention(s): Limited activity within patient's tolerance;Monitored during session;Premedicated before session;Repositioned  VSS    Home Living                      Prior Function            PT Goals (current goals can now be found in the care plan section) Progress towards PT goals: Progressing toward goals     Frequency  Min 5X/week    PT Plan Current plan remains appropriate    Co-evaluation             End of Session Equipment Utilized During Treatment: Gait belt;Back brace Activity Tolerance: Patient limited by fatigue Patient left: in chair;with call bell/phone within reach;with family/visitor present     Time: [id number] PT Time Calculation (min) (ACUTE ONLY): 31 min  Charges:  $Gait Training: 8-22 mins $Therapeutic Exercise: 8-22 mins                    G Codes:      WhiteArrie Aran F 02/09/16, 2:27 PM Kasidy Gianino,PT Acute Rehabilitation [phone] (pager)

## 2016-01-22 NOTE — Progress Notes (Signed)
PULMONARY / CRITICAL CARE MEDICINE   Name: Russell Gonzales MRN: [id number] DOB: 01-21-1951    ADMISSION DATE:  01/18/2016 CONSULTATION DATE:  8/8  REFERRING MD:  Dr. Ellene Route  CHIEF COMPLAINT:  Back surgery  HISTORY OF PRESENT ILLNESS:  65 year old male with past medical history as below, which is significant for chronic back pain, fatty liver disease, hypertension, hyperlipidemia, and GERD. 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 He presented again the end of July 2017 with complaints of leg pain and numbness. He underwent repeat myelography which demonstrated advanced spondylitic stenosis at multiple levels across the thoracic-lumbar junction and he has been recommended for surgical decompression and stabilization from T8 down to L2. 01/18/2016 he presented for elective procedure which was without complication. Postoperatively he was sent to the ICU for recovery and PCCM has been asked to evaluate.  SUBJECTIVE:  Extubated 8/9 .  Still c/o BLE weakness, but states he is stronger than 8/10 .He did get out of bed 8/10.  Denies SOB. Taking PO's well. To tx to floor 8/12  VITAL SIGNS: BP (!) 123/112   Pulse 74   Temp 97.5 F (36.4 C) (Oral)   Resp 11   Ht 5\' 6"  (1.676 m)   Wt 241 lb 10 oz (109.6 kg)   SpO2 100%   BMI 39.00 kg/m   HEMODYNAMICS:     INTAKE / OUTPUT: I/O last 3 completed shifts: In: 3853.9 [P.O.:1140; I.V.:2163.9; IV Piggyback:550] Out: [id number]; Drains:300]  PHYSICAL EXAMINATION: General:  Pleasant, obese male, NAD  Neuro:  Awake and alert, R=L BLE weakness improving HEENT:  [place]/AT, PERRL, no JVD Cardiovascular: RRR, no MRG Lungs: resps even non labored on RA, slightly diminished bases  Abdomen:  Soft, non-distended Musculoskeletal:  No acute deformity Skin:  Grossly intact. No pallor  LABS:  BMET  Recent Labs Lab 01/20/16 1702  01/21/16 0630 01/22/16 0624  NA 131* 134* 133*  K 3.3* 3.2* 3.4*  CL 98* 100* 99*  CO2 27 28 28   BUN 41* 37* 32*  CREATININE 2.49* 2.20* 1.90*  GLUCOSE 204* 124* 106*    Electrolytes  Recent Labs Lab 01/19/16 0235 01/19/16 0609  01/20/16 0900 01/20/16 1702 01/21/16 0630 01/22/16 0624  CALCIUM 7.6* 7.0*  < > 6.2* 6.0* 6.6* 7.1*  MG 1.9 1.6*  --  2.0  --   --   --   PHOS 7.1* 4.6  --  3.7  --   --   --   < > = values in this interval not displayed.  CBC  Recent Labs Lab 01/20/16 1702 01/21/16 0630 01/22/16 0624  WBC 13.9* 9.3 10.5  HGB 8.5* 8.0* 8.4*  HCT 24.6* 23.2* 24.1*  PLT 53* 42* 64*    Coag's  Recent Labs Lab 01/18/16 1850 01/18/16 2010 01/19/16 0302 01/20/16 0900  APTT 28  --   --  29  INR 1.37 1.47 1.73 1.40    Sepsis Markers  Recent Labs Lab 01/19/16 0237 01/19/16 0454 01/19/16 1146 01/20/16 0031 01/20/16 0900 01/21/16 0630  LATICACIDVEN 7.3*  --  2.91* 1.5  --   --   PROCALCITON  --  3.89  --   --  3.95 2.53    ABG  Recent Labs Lab 01/19/16 0334 01/19/16 0606 01/19/16 1015  PHART 7.162* 7.430 7.348*  PCO2ART 47.1* 26.3* 39.0  PO2ART 113* 112* 89.0    Liver Enzymes  Recent Labs Lab 01/18/16 0943 01/19/16 0235  AST 27 229*  ALT 79* 278*  ALKPHOS 66 42  BILITOT 1.1 1.9*  ALBUMIN 3.7 2.9*    Cardiac Enzymes  Recent Labs Lab 01/19/16 1500 01/19/16 2359 01/20/16 0900  TROPONINI 0.16* 0.13* 0.08*    Glucose  Recent Labs Lab 01/21/16 1546 01/21/16 1931 01/21/16 2349 01/22/16 0358 01/22/16 0739 01/22/16 1148  GLUCAP 123* 119* 101* 105* 106* 127*    Imaging No results found.   STUDIES:  CULTURES:  ANTIBIOTICS: 8/8 Azactam>>8/10 8/8 vanc>> 8/10 Primaxin>> SIGNIFICANT EVENTS: 8/8 to OR for elective decompression 8/9- arf  LINES/TUBES: ETT 8/8 >8/10 R rad art line 8/8 >8/10 Line left IJ 8/8>>>  DISCUSSION: 65 year old male presented 8/8 for elective surgical decompression and spinal  fixation. On vent in shock post op. Almost 5L blood loss. PCCM to assist with medical management. Extubated and off pressors x 24 hours. Tolerating PO's, Tx to floor 8/12.  ASSESSMENT / PLAN:  PULMONARY A: Inability to protect airway in post-operative setting - resolved.   P:   Pulmonary hygiene - add IS  Mobilize as able   CARDIOVASCULAR A:  Shock, intraoperative likely hypovolemic/vs hemorrhagic 4797mL blood loss estimated (s/p 1875 cell saver, 2 units FFP) H/o HTN, HLD Levo D/c'd greater than 24 hours P:  MAP goal > 73mmHg Assess blood count for transfusion need Holding home meds  RENAL Lab Results  Component Value Date   CREATININE 1.90 (H) 01/22/2016   CREATININE 2.20 (H) 01/21/2016   CREATININE 2.49 (H) 01/20/2016   CREATININE 0.78 03/30/2015   CREATININE 0.80 12/22/2014   CREATININE 0.67 0000000    Metabolic acidosis    Hyperkalemia(resolved) AKI - slowly improving, good UOP  P:   Repeat BMP Correct electrolytes as indicated Follow creatine D/c bicarb 8/10 Follow UOP  if renal failure worsens assess urine eos for interstitial nephritis from abx reaction  GASTROINTESTINAL A:   GERD Taking PO's well P:   Pepcid for SUP PO diet tolerating well  HEMATOLOGIC  Recent Labs  01/21/16 0630 01/22/16 0624  HGB 8.0* 8.4*    A:   ABLA Hematuria(resolved) ?DIC Anemia  HGB 8.0  ( 8/11) P:  Repeat CBC  8/12 Transfuse per ICU guidelines DIC panel d dimer 13.2 , fibrogen 162, inr 1.73, but he is post 4700 cc blood loss with cell saver transfusion. Will continue to monitor  INFECTIOUS A:   Surgical prophylaxis Presumed sepsis P:   Azactam/ vanc per NS stopped Leukocytosis resolving  Trend Fever curve  ENDOCRINE CBG (last 3)   Recent Labs  01/22/16 0358 01/22/16 0739 01/22/16 1148  GLUCAP 105* 106* 127*     Suspect adrenal insufficiency  DM P: CBG's   SSI  Stress steroids, florinef d/c'd 8/10  NEUROLOGIC A:   Severe spinal  stenosis status post Decompressive laminectomies from T10-L1, pedicle screw fixation T7 T8 T9 and left T10 with rods stabilization from T7-L2. Awake and alert 8/9  P:   RASS goal: 0 F/u CT 8/10 PRN fentanyl Per Neurosurgery  FAMILY  - Updates: pt updated 8/11  - Inter-disciplinary family meet or Palliative Care meeting due by:  8/15   Richardson Landry Minor ACNP Maryanna Shape PCCM Pager [phone] till 3 pm If no answer page [phone] 01/22/2016, 12:01 PM

## 2016-01-23 LAB — CBC WITH DIFFERENTIAL/PLATELET
BASOS ABS: 0 10*3/uL (ref 0.0–0.1)
Basophils Relative: 0 %
Eosinophils Absolute: 0.1 10*3/uL (ref 0.0–0.7)
Eosinophils Relative: 1 %
HEMATOCRIT: 24.7 % — AB (ref 39.0–52.0)
HEMOGLOBIN: 8.6 g/dL — AB (ref 13.0–17.0)
LYMPHS PCT: 14 %
Lymphs Abs: 1.3 10*3/uL (ref 0.7–4.0)
MCH: 32.8 pg (ref 26.0–34.0)
MCHC: 34.8 g/dL (ref 30.0–36.0)
MCV: 94.3 fL (ref 78.0–100.0)
MONO ABS: 1.1 10*3/uL — AB (ref 0.1–1.0)
MONOS PCT: 12 %
NEUTROS ABS: 6.5 10*3/uL (ref 1.7–7.7)
NEUTROS PCT: 73 %
Platelets: 102 10*3/uL — ABNORMAL LOW (ref 150–400)
RBC: 2.62 MIL/uL — ABNORMAL LOW (ref 4.22–5.81)
RDW: 12.8 % (ref 11.5–15.5)
WBC: 9 10*3/uL (ref 4.0–10.5)

## 2016-01-23 LAB — BASIC METABOLIC PANEL
Anion gap: 10 (ref 5–15)
BUN: 30 mg/dL — AB (ref 6–20)
CHLORIDE: 97 mmol/L — AB (ref 101–111)
CO2: 27 mmol/L (ref 22–32)
CREATININE: 1.92 mg/dL — AB (ref 0.61–1.24)
Calcium: 7.6 mg/dL — ABNORMAL LOW (ref 8.9–10.3)
GFR calc Af Amer: 41 mL/min — ABNORMAL LOW (ref >60)
GFR calc non Af Amer: 35 mL/min — ABNORMAL LOW (ref >60)
Glucose, Bld: 108 mg/dL — ABNORMAL HIGH (ref 65–99)
POTASSIUM: 4.2 mmol/L (ref 3.5–5.1)
SODIUM: 134 mmol/L — AB (ref 135–145)

## 2016-01-23 LAB — GLUCOSE, CAPILLARY
GLUCOSE-CAPILLARY: 114 mg/dL — AB (ref 65–99)
GLUCOSE-CAPILLARY: 135 mg/dL — AB (ref 65–99)
GLUCOSE-CAPILLARY: 108 mg/dL — AB (ref 65–99)
Glucose-Capillary: 99 mg/dL (ref 65–99)
Glucose-Capillary: 102 mg/dL — ABNORMAL HIGH (ref 65–99)

## 2016-01-23 LAB — PHOSPHORUS: Phosphorus: 3.1 mg/dL (ref 2.5–4.6)

## 2016-01-23 LAB — MAGNESIUM: Magnesium: 2.2 mg/dL (ref 1.7–2.4)

## 2016-01-23 NOTE — Progress Notes (Signed)
No issues overnight. Pt does cont to c/o back and leg pain. Ambulating better with PT.  EXAM:  BP 113/73 (BP Location: Left Arm)   Pulse 78   Temp 98.6 F (37 C) (Oral)   Resp 20   Ht 5\' 6"  (1.676 m)   Wt 109.6 kg (241 lb 10 oz)   SpO2 98%   BMI 39.00 kg/m   Awake, alert, oriented  Speech fluent, appropriate  CN grossly intact  5/5 BUE/BLE  Drain in place, 50cc  IMPRESSION:  65 y.o. male POD# 5 s/p thoracolumbar decompression/fusion. Progressing as expected.  PLAN: - Cont to mobilize - Will d/c drain today - Plan on likely CIR

## 2016-01-23 NOTE — Progress Notes (Signed)
Physical Therapy Treatment Patient Details Name: Russell Gonzales MRN: [id number] DOB: Apr 08, 1951 Today's Date: 01/23/2016    History of Present Illness 65 y.o. male admitted to [facility] on 01/18/16 for elective decompressive leminectomies from T10-L1, fusion T7-L2.  Pt with significant PMXh of SBO, HTN, low Fe, HTN, throacic laminectomy 08/2015, L1-S1 fusion 04/2008, L knee arthroscopy, ACDF 05/2008, and L ankle fx surgery.    PT Comments    Pt performed mobility, guarded, reports increased pain after sitting in chair for an hour and twenty minutes.  Pt educated to limit sitting to 30 minutes to reduce pain.  Brace remains difficult to donn and wife is concerned with placement at home.  Pt may do better in supine position as he becomes "pear shaped" in seated position which required readjustment in standing during today's tx.  Will continue efforts during acute hospitalization.      Follow Up Recommendations  CIR     Equipment Recommendations  None recommended by PT    Recommendations for Other Services Rehab consult     Precautions / Restrictions Precautions Precautions: Fall;Back Precaution Booklet Issued: Yes (comment) Precaution Comments: Verbally reviewed Required Braces or Orthoses: Spinal Brace Spinal Brace: Thoracolumbosacral orthotic;Applied in sitting position (with readjustment in standing.  ) Restrictions Weight Bearing Restrictions: No    Mobility  Bed Mobility Overal bed mobility: Needs Assistance Bed Mobility: Rolling;Sit to Sidelying Rolling: Min guard       Sit to sidelying: Min assist (to assist LEs into bed.  ) General bed mobility comments: pt demonstrated proper technique for log roll without cues or assist.   Total assist to don brace.  Brace appears too small but will fit better with adjustment in standing.  Pt may benefit from applying brace in supine as it took 2 people to tighten brace in seated and standing position.    Transfers Overall transfer level:  Needs assistance Equipment used: Rolling walker (2 wheeled) Transfers: Sit to/from Stand Sit to Stand: Min assist         General transfer comment: Cues for hand placement, min assist to boost and cues for forward weight shifting.    Ambulation/Gait Ambulation/Gait assistance: Min assist Ambulation Distance (Feet): 80 Feet Assistive device: Rolling walker (2 wheeled) Gait Pattern/deviations: Step-through pattern;Decreased stride length;Shuffle;Trunk flexed Gait velocity: decreased   General Gait Details: VCs for increased cadence and upright posture, min assist for stability. Chair follow used but did not require.     Stairs            Wheelchair Mobility    Modified Rankin (Stroke Patients Only)       Balance Overall balance assessment: Needs assistance   Sitting balance-Leahy Scale: Good       Standing balance-Leahy Scale: Fair                      Cognition Arousal/Alertness: Awake/alert Behavior During Therapy: WFL for tasks assessed/performed Overall Cognitive Status: Within Functional Limits for tasks assessed                      Exercises      General Comments        Pertinent Vitals/Pain Pain Assessment: 0-10 Pain Score: 6  Faces Pain Scale: Hurts even more Pain Location: Back Pain Descriptors / Indicators: Aching Pain Intervention(s): Limited activity within patient's tolerance;Repositioned;Monitored during session    Home Living  Prior Function            PT Goals (current goals can now be found in the care plan section) Acute Rehab PT Goals Patient Stated Goal: to return home Potential to Achieve Goals: Good Progress towards PT goals: Progressing toward goals    Frequency  Min 5X/week    PT Plan Current plan remains appropriate    Co-evaluation             End of Session Equipment Utilized During Treatment: Gait belt;Back brace Activity Tolerance: Patient limited by  fatigue Patient left: in chair;with call bell/phone within reach;with family/visitor present     Time: [id number] PT Time Calculation (min) (ACUTE ONLY): 23 min  Charges:  $Gait Training: 8-22 mins $Therapeutic Activity: 8-22 mins                    G Codes:      Cristela Blue 02-19-16, 11:07 AM  Governor Rooks, PTA pager [phone]

## 2016-01-24 LAB — CBC WITH DIFFERENTIAL/PLATELET
BASOS PCT: 0 %
Basophils Absolute: 0 10*3/uL (ref 0.0–0.1)
EOS ABS: 0.2 10*3/uL (ref 0.0–0.7)
Eosinophils Relative: 2 %
HEMATOCRIT: 26.5 % — AB (ref 39.0–52.0)
HEMOGLOBIN: 9.1 g/dL — AB (ref 13.0–17.0)
LYMPHS ABS: 1.4 10*3/uL (ref 0.7–4.0)
Lymphocytes Relative: 15 %
MCH: 32.7 pg (ref 26.0–34.0)
MCHC: 34.3 g/dL (ref 30.0–36.0)
MCV: 95.3 fL (ref 78.0–100.0)
Monocytes Absolute: 1.3 10*3/uL — ABNORMAL HIGH (ref 0.1–1.0)
Monocytes Relative: 14 %
NEUTROS ABS: 6.4 10*3/uL (ref 1.7–7.7)
NEUTROS PCT: 69 %
PLATELETS: 141 10*3/uL — AB (ref 150–400)
RBC: 2.78 MIL/uL — AB (ref 4.22–5.81)
RDW: 13 % (ref 11.5–15.5)
WBC: 9.3 10*3/uL (ref 4.0–10.5)

## 2016-01-24 LAB — GLUCOSE, CAPILLARY
GLUCOSE-CAPILLARY: 113 mg/dL — AB (ref 65–99)
GLUCOSE-CAPILLARY: 140 mg/dL — AB (ref 65–99)
Glucose-Capillary: 137 mg/dL — ABNORMAL HIGH (ref 65–99)
Glucose-Capillary: 127 mg/dL — ABNORMAL HIGH (ref 65–99)
Glucose-Capillary: 95 mg/dL (ref 65–99)

## 2016-01-24 LAB — PHOSPHORUS: Phosphorus: 3.9 mg/dL (ref 2.5–4.6)

## 2016-01-24 LAB — MAGNESIUM: MAGNESIUM: 2.1 mg/dL (ref 1.7–2.4)

## 2016-01-24 MED ORDER — MAGNESIUM HYDROXIDE 400 MG/5ML PO SUSP
30.0000 mL | Freq: Every evening | ORAL | Status: DC | PRN
  Administered 2016-01-25 – 2016-01-26 (×2): 30 mL via ORAL
  Filled 2016-01-24 (×2): qty 30

## 2016-01-24 MED ORDER — OXYCODONE HCL 5 MG PO TABS
15.0000 mg | ORAL_TABLET | ORAL | Status: DC | PRN
  Administered 2016-01-24 – 2016-01-28 (×17): 15 mg via ORAL
  Filled 2016-01-24 (×17): qty 3

## 2016-01-24 MED ORDER — INSULIN ASPART 100 UNIT/ML ~~LOC~~ SOLN
0.0000 [IU] | Freq: Three times a day (TID) | SUBCUTANEOUS | Status: DC
  Administered 2016-01-24 – 2016-01-28 (×4): 2 [IU] via SUBCUTANEOUS

## 2016-01-24 NOTE — Care Management Important Message (Signed)
Important Message  Patient Details  Name: Russell Gonzales MRN: [id number] Date of Birth: Feb 04, 1951   Medicare Important Message Given:  Yes    Orbie Pyo 01/24/2016, 11:33 AM

## 2016-01-24 NOTE — Progress Notes (Signed)
Inpatient Rehabilitation  I received a call from [facility], PT regarding her recommendation for a short rehab stay for Mr. Kimpson.  Upon chart review, I note Genie Karl Bales entered a note on 01/20/16  that it is unlikely pt's [employer] would authorize and IP Rehab stay with pt's current diagnoses.  We can pursue if MD would like [name] to attempt, however it is unlikely we could get approval.  Please call if questions.  [place] Admissions Coordinator Cell [phone] Office [phone]

## 2016-01-24 NOTE — Progress Notes (Signed)
Physical Therapy Treatment Patient Details Name: Russell Gonzales MRN: [id number] DOB: Oct 29, 1950 Today's Date: 01/24/2016    History of Present Illness 65 y.o. male admitted to [facility] on 01/18/16 for elective decompressive leminectomies from T10-L1, fusion T7-L2.  Pt with significant PMXh of SBO, HTN, low Fe, HTN, throacic laminectomy 08/2015, L1-S1 fusion 04/2008, L knee arthroscopy, ACDF 05/2008, and L ankle fx surgery.    PT Comments    Pt progressing slow but is progressing towards all goals. Pt motivated and would strongly benefit from CIR upon d/c to achieve maximal functional recovery for safe transition home with spouse.  Follow Up Recommendations  CIR     Equipment Recommendations       Recommendations for Other Services Rehab consult     Precautions / Restrictions Precautions Precautions: Fall;Back Precaution Booklet Issued: Yes (comment) Precaution Comments: Verbally reviewed Required Braces or Orthoses: Spinal Brace Spinal Brace: Thoracolumbosacral orthotic;Applied in sitting position Restrictions Weight Bearing Restrictions: No    Mobility  Bed Mobility Overal bed mobility: Needs Assistance Bed Mobility: Rolling;Sidelying to Sit Rolling: Supervision Sidelying to sit: Min guard       General bed mobility comments: increased time, minA for trunk elevation  Transfers Overall transfer level: Needs assistance Equipment used: Rolling walker (2 wheeled) Transfers: Sit to/from Stand Sit to Stand: Min assist         General transfer comment: v/c's to push up from bed, increased time  Ambulation/Gait Ambulation/Gait assistance: Min assist Ambulation Distance (Feet): 70 Feet Assistive device: Rolling walker (2 wheeled) Gait Pattern/deviations: Step-through pattern;Decreased stride length (decreased step height) Gait velocity: decreased Gait velocity interpretation: Below normal speed for age/gender General Gait Details: slow, guarded, mild knee instability, 2  standing rest breaks   Stairs            Wheelchair Mobility    Modified Rankin (Stroke Patients Only)       Balance Overall balance assessment: Needs assistance         Standing balance support: Bilateral upper extremity supported Standing balance-Leahy Scale: Fair Standing balance comment: requires use of RW for safe standing                    Cognition Arousal/Alertness: Awake/alert Behavior During Therapy: WFL for tasks assessed/performed Overall Cognitive Status: Within Functional Limits for tasks assessed                      Exercises      General Comments General comments (skin integrity, edema, etc.): discussed benefits of rehab at d/c      Pertinent Vitals/Pain Pain Assessment: 0-10 Pain Score: 6  Pain Location: L flank Pain Descriptors / Indicators: Aching Pain Intervention(s): Limited activity within patient's tolerance    Home Living                      Prior Function            PT Goals (current goals can now be found in the care plan section) Progress towards PT goals: Progressing toward goals    Frequency  Min 5X/week    PT Plan Current plan remains appropriate    Co-evaluation             End of Session Equipment Utilized During Treatment: Gait belt;Back brace Activity Tolerance: Patient limited by fatigue Patient left: in chair;with call bell/phone within reach;with family/visitor present;with chair alarm set     Time: [id number] PT Time Calculation (min) (  ACUTE ONLY): 26 min  Charges:  $Gait Training: 8-22 mins $Therapeutic Activity: 8-22 mins                    G Codes:      Kingsley Callander 01/24/2016, 12:05 PM   Kittie Plater, PT, DPT Pager #: [phone] Office #: [phone]

## 2016-01-24 NOTE — Progress Notes (Signed)
Patient requesting pain medication every three hours; doesn't feel relieved of pain; "I just feel sleepy but the pain is not relieved", per patient.  Await Dr.Elsner to review current pain management. Patient very cooperative. Reviewed goals out of bed activity with patient and his wife.

## 2016-01-24 NOTE — Progress Notes (Signed)
OT Cancellation Note  Patient Details Name: Russell Gonzales MRN: [id number] DOB: [birth year]   Cancelled Treatment:    Reason Eval/Treat Not Completed: Patient declined, no reason specified (pt with visitors present and just received dinner). Pt requesting OT check back tomorrow. Will follow up as time allows.  Binnie Kand M.S., OTR/L Pager: [phone]  01/24/2016, 4:43 PM

## 2016-01-25 LAB — CBC WITH DIFFERENTIAL/PLATELET
BASOS PCT: 0 %
Basophils Absolute: 0 10*3/uL (ref 0.0–0.1)
Eosinophils Absolute: 0.2 10*3/uL (ref 0.0–0.7)
Eosinophils Relative: 2 %
HEMATOCRIT: 25.9 % — AB (ref 39.0–52.0)
HEMOGLOBIN: 8.6 g/dL — AB (ref 13.0–17.0)
LYMPHS ABS: 1.8 10*3/uL (ref 0.7–4.0)
Lymphocytes Relative: 19 %
MCH: 31.7 pg (ref 26.0–34.0)
MCHC: 33.2 g/dL (ref 30.0–36.0)
MCV: 95.6 fL (ref 78.0–100.0)
MONO ABS: 1 10*3/uL (ref 0.1–1.0)
MONOS PCT: 11 %
NEUTROS ABS: 6.2 10*3/uL (ref 1.7–7.7)
NEUTROS PCT: 68 %
Platelets: 205 10*3/uL (ref 150–400)
RBC: 2.71 MIL/uL — ABNORMAL LOW (ref 4.22–5.81)
RDW: 13 % (ref 11.5–15.5)
WBC: 9.2 10*3/uL (ref 4.0–10.5)

## 2016-01-25 LAB — GLUCOSE, CAPILLARY
GLUCOSE-CAPILLARY: 117 mg/dL — AB (ref 65–99)
GLUCOSE-CAPILLARY: 113 mg/dL — AB (ref 65–99)
Glucose-Capillary: 117 mg/dL — ABNORMAL HIGH (ref 65–99)

## 2016-01-25 LAB — PHOSPHORUS: Phosphorus: 4.7 mg/dL — ABNORMAL HIGH (ref 2.5–4.6)

## 2016-01-25 LAB — MAGNESIUM: MAGNESIUM: 2 mg/dL (ref 1.7–2.4)

## 2016-01-25 NOTE — Progress Notes (Signed)
Physical Therapy Treatment Patient Details Name: Russell Gonzales MRN: [id number] DOB: 1950/09/18 Today's Date: 01/25/2016    History of Present Illness 65 y.o. male admitted to [facility] on 01/18/16 for elective decompressive leminectomies from T10-L1, fusion T7-L2.  Pt with significant PMXh of SBO, HTN, low Fe, HTN, throacic laminectomy 08/2015, L1-S1 fusion 04/2008, L knee arthroscopy, ACDF 05/2008, and L ankle fx surgery.    PT Comments    Patient continues to make gradual progress toward mobility goals. Increased pain with ambulation but tolerated slight increase in distance. Current plan remains appropriate.   Follow Up Recommendations  CIR     Equipment Recommendations  None recommended by PT    Recommendations for Other Services Rehab consult     Precautions / Restrictions Precautions Precautions: Fall;Back Precaution Comments: reviewed precautions; pt able to recall 2/3 beginning of session Required Braces or Orthoses: Spinal Brace Spinal Brace: Thoracolumbosacral orthotic;Applied in sitting position Restrictions Weight Bearing Restrictions: No    Mobility  Bed Mobility Overal bed mobility: Needs Assistance Bed Mobility: Rolling;Sidelying to Sit;Sit to Sidelying Rolling: Supervision Sidelying to sit: Supervision Supine to sit: Supervision   Sit to sidelying: Min guard General bed mobility comments: carry over of sequencing and techniue; min guard for safety and increased time  Transfers Overall transfer level: Needs assistance Equipment used: Rolling walker (2 wheeled) Transfers: Sit to/from Stand Sit to Stand: Min guard         General transfer comment: cues for safe hand placement  Ambulation/Gait Ambulation/Gait assistance: Min guard Ambulation Distance (Feet): 80 Feet Assistive device: Rolling walker (2 wheeled) Gait Pattern/deviations: Step-through pattern;Decreased stride length;Decreased step length - right;Decreased step length - left;Trunk flexed Gait  velocity: decreased   General Gait Details: cues for posture and proximity of RW; several brief standing rest breaks; slow, guarded movements   Stairs            Wheelchair Mobility    Modified Rankin (Stroke Patients Only)       Balance Overall balance assessment: Needs assistance Sitting-balance support: Bilateral upper extremity supported;Feet supported Sitting balance-Leahy Scale: Fair     Standing balance support: During functional activity;Bilateral upper extremity supported Standing balance-Leahy Scale: Fair                      Cognition Arousal/Alertness: Awake/alert Behavior During Therapy: WFL for tasks assessed/performed Overall Cognitive Status: Within Functional Limits for tasks assessed                      Exercises      General Comments        Pertinent Vitals/Pain Pain Assessment: 0-10 Pain Score: 6  Faces Pain Scale: Hurts worst Pain Location: back and bilat LE Pain Descriptors / Indicators: Aching;Radiating Pain Intervention(s): Limited activity within patient's tolerance;Monitored during session;Repositioned;Patient requesting pain meds-RN notified    Home Living                      Prior Function            PT Goals (current goals can now be found in the care plan section) Acute Rehab PT Goals Patient Stated Goal: to return home Progress towards PT goals: Progressing toward goals    Frequency  Min 5X/week    PT Plan Current plan remains appropriate    Co-evaluation             End of Session Equipment Utilized During Treatment: Gait belt;Back brace  Activity Tolerance: Patient tolerated treatment well Patient left: with call bell/phone within reach;in bed;with bed alarm set     Time: 1336-1403 PT Time Calculation (min) (ACUTE ONLY): 27 min  Charges:  $Gait Training: 8-22 mins $Therapeutic Activity: 8-22 mins                    G Codes:      Salina April,  PTA Pager: [phone]   01/25/2016, 2:13 PM

## 2016-01-25 NOTE — Care Management Note (Signed)
Case Management Note  Patient Details  Name: Russell Gonzales MRN: [id number] Date of Birth: October 03, 1950  Subjective/Objective:                    Action/Plan: Pt continues with pain issues and weakness. MD ordered CIR to reevaluate. CM following for d/c needs.   Expected Discharge Date:                  Expected Discharge Plan:     In-House Referral:     Discharge planning Services     Post Acute Care Choice:    Choice offered to:     DME Arranged:    DME Agency:     HH Arranged:    HH Agency:     Status of Service:     If discussed at [employer], dates discussed:    Additional Comments:  Pollie Friar, RN 01/25/2016, 3:21 PM

## 2016-01-25 NOTE — Progress Notes (Addendum)
Patient ID: Russell Gonzales, male   DOB: 28-Nov-1950, 65 y.o.   MRN: [id number]  vital signs are stable Patient has not had a BM yet Bladder function seems to be intact Patient is markedly weak in the legs Ambulation slow I believe he'll be an excellent candidate for inpatient rehabilitation Will obtain rehabilitation med consult

## 2016-01-25 NOTE — Progress Notes (Signed)
Occupational Therapy Treatment Patient Details Name: Russell Gonzales MRN: [id number] DOB: 1951/06/12 Today's Date: 01/25/2016    History of present illness 65 y.o. male admitted to [facility] on 01/18/16 for elective decompressive leminectomies from T10-L1, fusion T7-L2.  Pt with significant PMXh of SBO, HTN, low Fe, HTN, throacic laminectomy 08/2015, L1-S1 fusion 04/2008, L knee arthroscopy, ACDF 05/2008, and L ankle fx surgery.   OT comments  Pt demonstrates bathroom transfer but requires (A) for Don of TLSO and (A) for balance throughout session> pt with forward flexed position on RW due to pain in L side. Pt positioned in chair pending lunch arrival.    Follow Up Recommendations  CIR;Supervision/Assistance - 24 hour    Equipment Recommendations  3 in 1 bedside comode    Recommendations for Other Services Rehab consult    Precautions / Restrictions Precautions Precautions: Fall;Back Precaution Comments: Verbally reviewed Required Braces or Orthoses: Spinal Brace Spinal Brace: Thoracolumbosacral orthotic;Applied in sitting position       Mobility Bed Mobility Overal bed mobility: Needs Assistance Bed Mobility: Supine to Sit Rolling: Supervision   Supine to sit: Supervision     General bed mobility comments: required bed rail and increased time  Transfers Overall transfer level: Needs assistance Equipment used: Rolling walker (2 wheeled) Transfers: Sit to/from Stand Sit to Stand: Min assist         General transfer comment: incr time to power up into standing and bil UE used    Balance Overall balance assessment: Needs assistance Sitting-balance support: Bilateral upper extremity supported;Feet supported Sitting balance-Leahy Scale: Fair     Standing balance support: During functional activity;Bilateral upper extremity supported Standing balance-Leahy Scale: Poor                     ADL Overall ADL's : Needs assistance/impaired Eating/Feeding: Set up    Grooming: Wash/dry hands;Minimal assistance;Standing Grooming Details (indicate cue type and reason): heavy leaning on sink surface with bil Ue due to balance deficits and L side pain                 Toilet Transfer: Minimal Insurance claims handler Details (indicate cue type and reason): static standing to void bladder in urinal          Functional mobility during ADLs: Minimal assistance;Rolling walker General ADL Comments: pt required total (A) to don TLSO. pt requires BIL UE supported to don TLSO due to discomfort with static sitting. pt with L lateral flank pain and reports it increasing pain throughout session. Pt reports pain only remains in L Lateral flank area. Pt states "i could do more if it wasnt for this pain. What could this be?"       Vision                     Perception     Praxis      Cognition   Behavior During Therapy: [facility] for tasks assessed/performed Overall Cognitive Status: Within Functional Limits for tasks assessed                       Extremity/Trunk Assessment               Exercises     Shoulder Instructions       General Comments      Pertinent Vitals/ Pain       Pain Assessment: Faces Faces Pain Scale: Hurts worst Pain Location: L abdomen Pain Descriptors / Indicators:  Aching;Discomfort;Sharp;Other (Comment) (comes on fast and leaves slowly) Pain Intervention(s): Repositioned;Premedicated before session;Monitored during session;Limited activity within patient's tolerance  Home Living                                          Prior Functioning/Environment              Frequency Min 2X/week     Progress Toward Goals  OT Goals(current goals can now be found in the care plan section)  Progress towards OT goals: Progressing toward goals  Acute Rehab OT Goals Patient Stated Goal: to return home OT Goal Formulation: With patient Time For Goal Achievement: 02/04/16 ADL  Goals Pt Will Perform Lower Body Bathing: with caregiver independent in assisting;sit to/from stand;with adaptive equipment;with supervision Pt Will Perform Lower Body Dressing: with supervision;sit to/from stand;with adaptive equipment;with caregiver independent in assisting Pt Will Transfer to Toilet: with modified independence;ambulating;bedside commode Pt Will Perform Toileting - Clothing Manipulation and hygiene: with supervision;with set-up;sitting/lateral leans;sit to/from stand;with adaptive equipment Additional ADL Goal #1: Pt will independently verbalize 3/3 back precuations for ADL  Plan Discharge plan remains appropriate    Co-evaluation                 End of Session Equipment Utilized During Treatment: Gait belt;Rolling walker;Back brace   Activity Tolerance Patient tolerated treatment well   Patient Left in chair;with call bell/phone within reach;with chair alarm set   Nurse Communication Mobility status;Precautions        Time: [id number] OT Time Calculation (min): 16 min  Charges: OT General Charges $OT Visit: 1 Procedure OT Treatments $Self Care/Home Management : 8-22 mins  Parke Poisson B 01/25/2016, 12:19 PM   Jeri Modena   OTR/L Pager: [phone] Office: [phone] .

## 2016-01-26 ENCOUNTER — Inpatient Hospital Stay (HOSPITAL_COMMUNITY): Payer: PPO | Admitting: Occupational Therapy

## 2016-01-26 DIAGNOSIS — M4714 Other spondylosis with myelopathy, thoracic region: Secondary | ICD-10-CM

## 2016-01-26 DIAGNOSIS — Z981 Arthrodesis status: Secondary | ICD-10-CM

## 2016-01-26 LAB — PHOSPHORUS: Phosphorus: 4.6 mg/dL (ref 2.5–4.6)

## 2016-01-26 LAB — CBC WITH DIFFERENTIAL/PLATELET
Basophils Absolute: 0 10*3/uL (ref 0.0–0.1)
Basophils Relative: 0 %
EOS ABS: 0.3 10*3/uL (ref 0.0–0.7)
Eosinophils Relative: 3 %
HEMATOCRIT: 25 % — AB (ref 39.0–52.0)
HEMOGLOBIN: 8.3 g/dL — AB (ref 13.0–17.0)
LYMPHS ABS: 1.6 10*3/uL (ref 0.7–4.0)
Lymphocytes Relative: 20 %
MCH: 31.7 pg (ref 26.0–34.0)
MCHC: 33.2 g/dL (ref 30.0–36.0)
MCV: 95.4 fL (ref 78.0–100.0)
MONO ABS: 0.9 10*3/uL (ref 0.1–1.0)
MONOS PCT: 11 %
NEUTROS ABS: 5.3 10*3/uL (ref 1.7–7.7)
NEUTROS PCT: 66 %
Platelets: 248 10*3/uL (ref 150–400)
RBC: 2.62 MIL/uL — ABNORMAL LOW (ref 4.22–5.81)
RDW: 12.9 % (ref 11.5–15.5)
WBC: 8.1 10*3/uL (ref 4.0–10.5)

## 2016-01-26 LAB — GLUCOSE, CAPILLARY
GLUCOSE-CAPILLARY: 109 mg/dL — AB (ref 65–99)
Glucose-Capillary: 109 mg/dL — ABNORMAL HIGH (ref 65–99)
Glucose-Capillary: 134 mg/dL — ABNORMAL HIGH (ref 65–99)
Glucose-Capillary: 129 mg/dL — ABNORMAL HIGH (ref 65–99)
Glucose-Capillary: 101 mg/dL — ABNORMAL HIGH (ref 65–99)

## 2016-01-26 LAB — MAGNESIUM: MAGNESIUM: 2.1 mg/dL (ref 1.7–2.4)

## 2016-01-26 NOTE — Progress Notes (Signed)
Patient ID: Russell Gonzales, male   DOB: [birth year], 65 y.o.   MRN: [id number] Vital signs are stable Voiding reasonably well Has not had bowel movement yet Await evaluation from rehabilitation med Nearing readiness for discharge We'll remove staples from back

## 2016-01-26 NOTE — Progress Notes (Signed)
Inpatient Rehabilitation  I met with the patient to review and discuss the recommendation for IP Rehab.  I provided informational booklets and answered pt.'s questions.  He would like for me to pursue insurance authorization.  I will initiate the process, however in all likelihood will not receive a decision today.  I have alerted Jacqualin Combes, RNCM of the plan.  Recommend a back up plan in case pt. cannot come to IP Rehab for any reason.  Please call if questions.  [place] Admissions Coordinator Cell [phone] Office [phone]

## 2016-01-26 NOTE — Clinical Social Work Note (Signed)
CSW received SNF consult for patient. CIR is evaluating patient, but not sure about insurance authorization. CSW talked with patient about ST rehab in a skilled facility if CIR unable to accept him and patient indicated that he will return home (full assessment to follow). CSW later advised by nurse case manager (approx. 4 pm) that wife in favor of SNF for rehab and is requesting [employer]. Contact made with [facility] and they do not have any beds for patient today or Thursday. Call made to wife at 7:14 pm and message left for her to contact CSW.  Avalin Briley Givens, MSW, LCSW Licensed Clinical Social Worker [place] [phone]

## 2016-01-26 NOTE — Progress Notes (Signed)
Staple removed from back incision. 28 counted

## 2016-01-26 NOTE — NC FL2 (Signed)
[place] LEVEL OF CARE SCREENING TOOL     IDENTIFICATION  Patient Name: Russell Gonzales Birthdate: 1951/04/11 Sex: male Admission Date (Current Location): 01/18/2016  [facility] [place] Number:  Herbalist and Address:  [facility] [place] [place] [address], [place], [place] [address]      Provider Number: [id number]  Attending Physician Name and Address:  [name], MD  Relative Name and Phone Number:  Russell Gonzales - wife.  Phone [phone]    Current Level of Care:   Recommended Level of Care:   Prior Approval Number:    Date Approved/Denied:   PASRR Number: [id number] (Eff. 01/26/16)  Discharge Plan: SNF    Current Diagnoses: Patient Active Problem List   Diagnosis Date Noted  . Encounter for central line placement   . Acute respiratory failure ([place])   . Encounter for intubation   . S/P lumbar fusion 01/18/2016  . 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 Myelopathy ([place]) 01/09/2016  . Back pain 01/09/2016  . Thoracic stenosis 08/11/2015  . 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 Other and unspecified hyperlipidemia 03/11/2013  . UNSPECIFIED ESSENTIAL HYPERTENSION 09/01/2009  . DIZZINESS 09/01/2009  . CHEST PAIN UNSPECIFIED 09/01/2009    Orientation RESPIRATION BLADDER Height & Weight     Self, Time, Situation  Normal Continent Weight: 241 lb 10 oz (109.6 kg) Height:  5\' 6"  (167.6 cm)  BEHAVIORAL SYMPTOMS/MOOD NEUROLOGICAL BOWEL NUTRITION STATUS      Continent Diet (Heart healthy)  AMBULATORY STATUS COMMUNICATION OF NEEDS Skin   Limited Assist Verbally Normal, Other (Comment) (Incision back)                       Personal Care Assistance Level of Assistance  Bathing, Feeding, Dressing Bathing Assistance: Limited assistance Feeding assistance: Independent Dressing Assistance: Limited assistance     Functional Limitations Info  Sight, Hearing, Speech Sight Info: Adequate Hearing Info:  Adequate Speech Info: Adequate    SPECIAL CARE FACTORS FREQUENCY  PT (By licensed PT), OT (By licensed OT)     PT Frequency: Evaluated 8/9 and a minimum of 5X per week therapy recommended OT Frequency: Evaluated 8/11 and a minimum of 2X per week therapy recommended            Contractures Contractures Info: Not present    Additional Factors Info  Code Status, Allergies Code Status Info: Full Allergies Info: Aspirin, BC Fast Pain, Tape           Current Medications (01/26/2016):  This is the current hospital active medication list Current Facility-Administered Medications  Medication Dose Route Frequency Provider Last Rate Last Dose  . 0.9 %  sodium chloride infusion   Intravenous Continuous [name], NP 10 mL/hr at 01/21/16 1149 10 mL/hr at 01/21/16 1149  . alum & mag hydroxide-simeth (MAALOX/MYLANTA) 200-200-20 MG/5ML suspension 30 mL  30 mL Oral Q6H PRN [name], MD   30 mL at 01/21/16 1822  . bisacodyl (DULCOLAX) suppository 10 mg  10 mg Rectal Daily PRN [name], MD      . diazepam (VALIUM) tablet 5 mg  5 mg Oral Q8H PRN Newman Pies, MD   5 mg at 01/25/16 0553  . diphenhydrAMINE (BENADRYL) injection 25 mg  25 mg Intravenous Q6H PRN Anders Simmonds, MD   25 mg at 01/18/16 2344  . famotidine (PEPCID) tablet 20 mg  20 mg Oral BID [name], MD   20 mg  at 01/26/16 1000  . fentaNYL (DURAGESIC - dosed mcg/hr) 75 mcg  75 mcg Transdermal Q72H [name], MD   75 mcg at 01/25/16 1009  . insulin aspart (novoLOG) injection 0-15 Units  0-15 Units Subcutaneous TID AC & HS [name], MD   2 Units at 01/26/16 [phone]  . magnesium hydroxide (MILK OF MAGNESIA) suspension 30 mL  30 mL Oral QHS PRN [name], MD   30 mL at 01/25/16 2205  . ondansetron (ZOFRAN) injection 4 mg  4 mg Intravenous Q4H PRN [name], MD   4 mg at 01/22/16 0918  . oxyCODONE (Oxy IR/ROXICODONE) immediate release tablet 15 mg  15 mg Oral Q4H PRN [name], MD   15 mg at 01/26/16 1044  .  phenol (CHLORASEPTIC) mouth spray 1 spray  1 spray Mouth/Throat PRN [name], MD      . polyethylene glycol (MIRALAX / GLYCOLAX) packet 17 g  17 g Oral Daily PRN [name], MD   17 g at 01/24/16 1740  . senna (SENOKOT) tablet 8.6 mg  1 tablet Oral BID [name], MD   8.6 mg at 01/26/16 1044  . sodium phosphate (FLEET) 7-19 GM/118ML enema 1 enema  1 enema Rectal Once PRN [name], MD         Discharge Medications: Please see discharge summary for a list of discharge medications.  Relevant Imaging Results:  Relevant Lab Results:   Additional Information [phone] [phone]  Sable Feil, LCSW

## 2016-01-26 NOTE — Consult Note (Signed)
Physical Medicine and Rehabilitation Consult Reason for Consult: Thoracic myelopathy with paraparesis Referring Physician: Dr. Ellene Route   HPI: Russell Gonzales is a 65 y.o. right handed male with history of hypertension, ACDF 2009, chronic lower back pain status post decompression and fusion L2-S2. Patient with history of decompressive thoracic laminectomy for spinal stenosis/myelopathy T11 and T12-March 2017 per Dr. Sherley Bounds. Per chart review patient lives with spouse in [place]. Independent with assistive device prior to admission. Currently living in a mobile home with 3 steps to entry and building a home in [place] with anticipation to move soon.. Presented 01/18/2016 with difficulty in ambulation and significant myelopathy. Myelogram showed degenerative scoliosis across the thoracic lumbar spine at T11-T12 and significant stenosis. Underwent decompressive laminectomy from T11-L1, pedicle screw fixation T7-T8-T9 and left T10 with rods stabilization from T7-L2 01/18/2016 per Dr. Ellene Route. Hospital course pain management. Postoperatively needed vent management. Back brace when out of bed. Bouts of orthostatic hypotension. Acute blood loss anemia 8.0-8.6. Physical therapy evaluation completed and ongoing. Request made for physical medicine rehabilitation consult.  Patient resting comfortably in bed. He states he is taking oral pain medications. He has not moved his bowels since surgery. Has been using toilet for bladder needs with assistance from CNA  Review of Systems  Constitutional: Negative for chills and fever.  HENT: Negative for hearing loss.   Eyes: Negative for blurred vision and double vision.  Respiratory: Negative for cough.        Shortness of breath with exertion  Cardiovascular: Positive for leg swelling. Negative for chest pain and palpitations.  Gastrointestinal: Positive for constipation. Negative for nausea.       GERD  Genitourinary: Positive for urgency. Negative for  dysuria and hematuria.  Musculoskeletal: Positive for back pain and myalgias.  Skin: Negative for rash.  Neurological: Positive for weakness. Negative for seizures and headaches.  All other systems reviewed and are negative.  Past Medical History:  Diagnosis Date  . Arthritis    "severe in my back" (08/11/2015)  . Chronic lower back pain   . Fatty liver   . GERD (gastroesophageal reflux disease)   . Hemorrhoids   . Hyperlipidemia   . Hypertension   . Low iron   . Seasonal allergies   . Shortness of breath dyspnea    With exertion   Past Surgical History:  Procedure Laterality Date  . ANKLE FRACTURE SURGERY Left 1972   "it was crushed; has screws and plates"  . ANTERIOR CERVICAL DECOMP/DISCECTOMY FUSION  05/2008  . BACK SURGERY    . COLONOSCOPY W/ BIOPSIES AND POLYPECTOMY    . FRACTURE SURGERY    . KNEE ARTHROSCOPY Left 1970s   "removed cyst"  . LUMBAR LAMINECTOMY/DECOMPRESSION MICRODISCECTOMY N/A 08/11/2015   Procedure: Laminectomy - Thoracic eleven - Thoracic tweleve;  Surgeon: Eustace Moore, MD;  Location: [facility];  Service: Neurosurgery;  Laterality: N/A;  . POLYPECTOMY  2016  . POSTERIOR LUMBAR FUSION  04/2008   "L1-S1; have rods and screws between 4 vertebrae"  . POSTERIOR LUMBAR FUSION 4 LEVEL N/A 01/18/2016   Procedure: Thoracic Eight-Lumbar Two Laminectomy with posterior decompression/segmental fixation;  Surgeon: [name], MD;  Location: [facility];  Service: Neurosurgery;  Laterality: N/A;  T8 to L2 Laminectomy with posterior decompression/segmental fixation from T8 to L2  . PROSTATE BIOPSY  2012  . THORACIC LAMINECTOMY  08/11/2015   Decompressive thoracic laminectomy, medial facetectomy and foraminotomy T11 and T12   Family History  Problem  Relation Age of Onset  . Pancreatic cancer Mother   . Prostate cancer Father   . Colon cancer Neg Hx    Social History:  reports that he has quit smoking. His smoking use included Cigars. He has a 15.00 pack-year  smoking history. He has never used smokeless tobacco. He reports that he drinks about 21.0 oz of alcohol per week . He reports that he uses drugs, including Other-see comments. Allergies:  Allergies  Allergen Reactions  . Aspirin Hives  . Bc Fast Pain [Aspirin-Caffeine] Hives  . Ancef [Cefazolin] Rash    RASH  . Tape Rash    Surgical Tape- 2009ish  Back surgery - broke out- rash   Facility-Administered Medications Prior to Admission  Medication Dose Route Frequency Provider Last Rate Last Dose  . 0.9 %  sodium chloride infusion  500 mL Intravenous Continuous Ladene Artist, MD       Medications Prior to Admission  Medication Sig Dispense Refill  . atorvastatin (LIPITOR) 20 MG tablet TAKE ONE TABLET BY MOUTH ONCE DAILY (Patient taking differently: TAKE ONE TABLET (20 mg) BY MOUTH every evening) 30 tablet 11  . dexamethasone (DECADRON) 4 MG tablet Take 1 tablet (4 mg total) by mouth 2 (two) times daily. 30 tablet 2  . lisinopril (PRINIVIL,ZESTRIL) 40 MG tablet Take 1 tablet daily (Patient taking differently: Take 40 mg by mouth every evening. ) 30 tablet 11  . Multiple Vitamins-Minerals (MULTIVITAMIN ADULT PO) Take 1 tablet by mouth daily.     . traMADol (ULTRAM) 50 MG tablet Take 1 tablet (50 mg total) by mouth every 6 (six) hours as needed for moderate pain. 60 tablet 3  . fish oil-omega-3 fatty acids 1000 MG capsule Take 1 g by mouth 2 (two) times daily.      . hydrochlorothiazide (MICROZIDE) 12.5 MG capsule Take 1 capsule (12.5 mg total) by mouth daily. 30 capsule 11  . HYDROcodone-acetaminophen (NORCO) 5-325 MG tablet Take 1 tablet by mouth every 6 (six) hours as needed for severe pain. 30 tablet 0  . ketoconazole (NIZORAL) 2 % cream Apply 1 application topically daily as needed for irritation. Reported on 07/01/2015      Home: Home Living Family/patient expects to be discharged to:: Private residence Living Arrangements: Spouse/significant other Available Help at Discharge:  Family, Available PRN/intermittently Type of Home: Mobile home Home Access: Stairs to enter Entrance Stairs-Number of Steps: 3 Entrance Stairs-Rails: Left (wall on the right) Home Layout: One level Bathroom Shower/Tub: Chiropodist: Handicapped height Bathroom Accessibility: No Home Equipment: Environmental consultant - 4 wheels, Cane - single point, Grab bars - tub/shower, Careers adviser History: Prior Function Level of Independence: Independent with assistive device(s) Comments: increased difficutly ambulating just prior to admission with dragging L leg (wife assisted wtih socks) Functional Status:  Mobility: Bed Mobility Overal bed mobility: Needs Assistance Bed Mobility: Rolling, Sidelying to Sit, Sit to Sidelying Rolling: Supervision Sidelying to sit: Supervision Supine to sit: Supervision Sit to sidelying: Min guard General bed mobility comments: carry over of sequencing and techniue; min guard for safety and increased time Transfers Overall transfer level: Needs assistance Equipment used: Rolling walker (2 wheeled) Transfers: Sit to/from Stand Sit to Stand: Min guard General transfer comment: cues for safe hand placement Ambulation/Gait Ambulation/Gait assistance: Min guard Ambulation Distance (Feet): 80 Feet Assistive device: Rolling walker (2 wheeled) Gait Pattern/deviations: Step-through pattern, Decreased stride length, Decreased step length - right, Decreased step length - left, Trunk flexed General Gait Details: cues for  posture and proximity of RW; several brief standing rest breaks; slow, guarded movements Gait velocity: decreased Gait velocity interpretation: Below normal speed for age/gender    ADL: ADL Overall ADL's : Needs assistance/impaired Eating/Feeding: Set up Grooming: Wash/dry hands, Minimal assistance, Standing Grooming Details (indicate cue type and reason): heavy leaning on sink surface with bil Ue due to balance deficits and L side  pain Upper Body Bathing: Minimal assitance, Sitting Lower Body Bathing: Maximal assistance, Sit to/from stand Upper Body Dressing : Maximal assistance Upper Body Dressing Details (indicate cue type and reason): including TLSO Lower Body Dressing: Maximal assistance, Sit to/from stand Toilet Transfer: Minimal assistance, RW Toilet Transfer Details (indicate cue type and reason): static standing to void bladder in urinal  Functional mobility during ADLs: Minimal assistance, Rolling walker General ADL Comments: pt required total (A) to don TLSO. pt requires BIL UE supported to don TLSO due to discomfort with static sitting. pt with L lateral flank pain and reports it increasing pain throughout session. Pt reports pain only remains in L Lateral flank area. Pt states "i could do more if it wasnt for this pain. What could this be?"   Cognition: Cognition Overall Cognitive Status: Within Functional Limits for tasks assessed Orientation Level: Oriented X4 Cognition Arousal/Alertness: Awake/alert Behavior During Therapy: WFL for tasks assessed/performed Overall Cognitive Status: Within Functional Limits for tasks assessed  Blood pressure 129/68, pulse 85, temperature 98.2 F (36.8 C), temperature source Oral, resp. rate 18, height 5\' 6"  (1.676 m), weight 109.6 kg (241 lb 10 oz), SpO2 95 %. Physical Exam  Constitutional: He is oriented to person, place, and time.  HENT:  Head: Normocephalic.  Eyes: EOM are normal.  Neck: Normal range of motion. Neck supple. No thyromegaly present.  Cardiovascular: Normal rate and regular rhythm.   Respiratory: Effort normal and breath sounds normal.  GI: Soft. Bowel sounds are normal.  Abdomen is mildly distended  Neurological: He is alert and oriented to person, place, and time.  Skin:  Back incision is dressed  Hyperreflexia, bilateral lower extremities. Bilateral clonus at the ankles. Motor strength is 3 minus in the hip flexors, knee extensors 4 minus,  ankle dorsiflexors. Motor Strength in the upper extremities, 5/5 bilateral deltoid, biceps, triceps, grip  Results for orders placed or performed during the hospital encounter of 01/18/16 (from the past 24 hour(s))  Glucose, capillary     Status: Abnormal   Collection Time: 01/25/16  6:17 AM  Result Value Ref Range   Glucose-Capillary 117 (H) 65 - 99 mg/dL   Comment 1 Notify RN    Comment 2 Document in Chart   Magnesium     Status: None   Collection Time: 01/25/16  7:22 AM  Result Value Ref Range   Magnesium 2.0 1.7 - 2.4 mg/dL  Phosphorus     Status: Abnormal   Collection Time: 01/25/16  7:22 AM  Result Value Ref Range   Phosphorus 4.7 (H) 2.5 - 4.6 mg/dL  CBC with Differential/Platelet     Status: Abnormal   Collection Time: 01/25/16  7:22 AM  Result Value Ref Range   WBC 9.2 4.0 - 10.5 K/uL   RBC 2.71 (L) 4.22 - 5.81 MIL/uL   Hemoglobin 8.6 (L) 13.0 - 17.0 g/dL   HCT 25.9 (L) 39.0 - 52.0 %   MCV 95.6 78.0 - 100.0 fL   MCH 31.7 26.0 - 34.0 pg   MCHC 33.2 30.0 - 36.0 g/dL   RDW 13.0 11.5 - 15.5 %   Platelets  205 150 - 400 K/uL   Neutrophils Relative % 68 %   Neutro Abs 6.2 1.7 - 7.7 K/uL   Lymphocytes Relative 19 %   Lymphs Abs 1.8 0.7 - 4.0 K/uL   Monocytes Relative 11 %   Monocytes Absolute 1.0 0.1 - 1.0 K/uL   Eosinophils Relative 2 %   Eosinophils Absolute 0.2 0.0 - 0.7 K/uL   Basophils Relative 0 %   Basophils Absolute 0.0 0.0 - 0.1 K/uL  Glucose, capillary     Status: Abnormal   Collection Time: 01/25/16 11:29 AM  Result Value Ref Range   Glucose-Capillary 117 (H) 65 - 99 mg/dL  Glucose, capillary     Status: Abnormal   Collection Time: 01/25/16  4:54 PM  Result Value Ref Range   Glucose-Capillary 113 (H) 65 - 99 mg/dL   No results found.  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 Does the need for close, 24 hr/day medical supervision in concert with the patient's rehab needs make it unreasonable for this patient to be served in  a less intensive setting? Yes 2. Co-Morbidities requiring supervision/potential complications: Hypertension, history of cervical laminectomy, postoperative constipation 3. Due to bladder management, bowel management, safety, skin/wound care, disease management, medication administration, pain management and patient education, does the patient require 24 hr/day rehab nursing? Yes 4. Does the patient require coordinated care of a physician, rehab nurse, PT (1-2 hrs/day, 5 days/week) and OT (1-2 hrs/day, 5 days/week) to address physical and functional deficits in the context of the above medical diagnosis(es)? Yes Addressing deficits in the following areas: balance, endurance, locomotion, strength, transferring, bowel/bladder control, bathing, dressing, feeding, grooming and cognition 5. Can the patient actively participate in an intensive therapy program of at least 3 hrs of therapy per day at least 5 days per week? Yes 6. The potential for patient to make measurable gains while on inpatient rehab is excellent 7. Anticipated functional outcomes upon discharge from inpatient rehab are modified independent  with PT, modified independent with OT, n/a with SLP. 8. Estimated rehab length of stay to reach the above functional goals is: 7d  9. Does the patient have adequate social supports and living environment to accommodate these discharge functional goals? Yes and Potentially 10. Anticipated D/C setting: Home 11. Anticipated post D/C treatments: [place] therapy 12. Overall Rehab/Functional Prognosis: excellent  RECOMMENDATIONS: This patient's condition is appropriate for continued rehabilitative care in the following setting: CIR Patient has agreed to participate in recommended program. Yes Note that insurance prior authorization may be required for reimbursement for recommended care.  Comment:     01/26/2016

## 2016-01-27 LAB — GLUCOSE, CAPILLARY
GLUCOSE-CAPILLARY: 105 mg/dL — AB (ref 65–99)
Glucose-Capillary: 128 mg/dL — ABNORMAL HIGH (ref 65–99)
Glucose-Capillary: 103 mg/dL — ABNORMAL HIGH (ref 65–99)

## 2016-01-27 NOTE — Progress Notes (Signed)
Rehab admissions - I spoke with insurance case Freight forwarder at [employer]  We will not have a decision regarding potential inpatient rehab admission until tomorrow morning.  Call me for questions.  [phone]

## 2016-01-27 NOTE — Progress Notes (Signed)
Physical Therapy Treatment Patient Details Name: Russell Gonzales MRN: [id number] DOB: 29-Aug-1950 Today's Date: 01/27/2016    History of Present Illness 65 y.o. male admitted to [facility] on 01/18/16 for elective decompressive leminectomies from T10-L1, fusion T7-L2.  Pt with significant PMXh of SBO, HTN, low Fe, HTN, throacic laminectomy 08/2015, L1-S1 fusion 04/2008, L knee arthroscopy, ACDF 05/2008, and L ankle fx surgery.    PT Comments    Patient limited with standing and ambulation tolerance with weakness in legs causing increased knee flexion throughout gait.  Continue to feel CIR rehab indicated to progress to home.  Follow Up Recommendations  CIR     Equipment Recommendations  None recommended by PT    Recommendations for Other Services       Precautions / Restrictions Precautions Precautions: Fall;Back Spinal Brace: Thoracolumbosacral orthotic;Applied in sitting position    Mobility  Bed Mobility   Bed Mobility: Rolling;Sidelying to Sit Rolling: Supervision Sidelying to sit: Supervision     Sit to sidelying: Min guard General bed mobility comments: used rail for rolling and to sit, increased time, assist for guiding legs onto bed  Transfers Overall transfer level: Needs assistance Equipment used: Rolling walker (2 wheeled) Transfers: Sit to/from Stand Sit to Stand: Min assist         General transfer comment: assist for walker safety and balance  Ambulation/Gait Ambulation/Gait assistance: Min guard Ambulation Distance (Feet): 90 Feet Assistive device: Rolling walker (2 wheeled) Gait Pattern/deviations: Shuffle;Decreased stride length;Step-through pattern (knees flexed)     General Gait Details: heavy UE support on walker and legs giving away at times (shrinking shorter with increased knee flexion)  cues for tall posture   Stairs            Wheelchair Mobility    Modified Rankin (Stroke Patients Only)       Balance     Sitting balance-Leahy  Scale: Fair       Standing balance-Leahy Scale: Poor Standing balance comment: UE support needed for standing and only tolerates short time standing                    Cognition Arousal/Alertness: Awake/alert Behavior During Therapy: WFL for tasks assessed/performed Overall Cognitive Status: Within Functional Limits for tasks assessed                      Exercises      General Comments        Pertinent Vitals/Pain Pain Score: 6  Pain Location: back and bilat thighs Pain Descriptors / Indicators: Aching (hurt) Pain Intervention(s): Monitored during session;Premedicated before session    Home Living                      Prior Function            PT Goals (current goals can now be found in the care plan section) Progress towards PT goals: Progressing toward goals    Frequency  Min 5X/week    PT Plan Current plan remains appropriate    Co-evaluation             End of Session Equipment Utilized During Treatment: Back brace Activity Tolerance: Patient limited by fatigue Patient left: in bed;with call bell/phone within reach;with family/visitor present;with bed alarm set     Time: [id number] PT Time Calculation (min) (ACUTE ONLY): 25 min  Charges:  $Gait Training: 8-22 mins $Therapeutic Activity: 8-22 mins  G CodesReginia Naas 01/27/2016, 5:12 PM  Magda Kiel, [place] 01/27/2016

## 2016-01-27 NOTE — Care Management Important Message (Signed)
Important Message  Patient Details  Name: Russell Gonzales MRN: [id number] Date of Birth: Mar 02, 1951   Medicare Important Message Given:  Yes    Orbie Pyo 01/27/2016, 12:57 PM

## 2016-01-27 NOTE — Care Management Important Message (Signed)
Important Message  Patient Details  Name: Russell Gonzales MRN: [id number] Date of Birth: September 12, 1950   Medicare Important Message Given:  Yes    Orbie Pyo 01/27/2016, 12:37 PM

## 2016-01-27 NOTE — Progress Notes (Signed)
Patient ID: Russell Gonzales, male   DOB: 14-May-1951, 65 y.o.   MRN: [id number] Vital signs are stable Patient is moving about slowly Bowels have not yet moved Has had milk of magnesia Encouraged ambulation stimulate bowels He would be an appropriate candidate for inpatient rehabilitation Alternative would be to consider skilled nursing facility Wife desires [facility] Patient would prefer to go home We'll see how today involves with patient's mobility and overall well-being.

## 2016-01-27 NOTE — Care Management Note (Signed)
Case Management Note  Patient Details  Name: Russell Gonzales MRN: [id number] Date of Birth: Nov 30, 1950  Subjective/Objective:                    Action/Plan: Pt with constipation and receiving treatment. Plan is CIR vs SNF. CM following for d/c needs.   Expected Discharge Date:                  Expected Discharge Plan:     In-House Referral:     Discharge planning Services     Post Acute Care Choice:    Choice offered to:     DME Arranged:    DME Agency:     HH Arranged:    HH Agency:     Status of Service:     If discussed at [employer], dates discussed:    Additional Comments:  Pollie Friar, RN 01/27/2016, 2:10 PM

## 2016-01-28 ENCOUNTER — Encounter (HOSPITAL_COMMUNITY): Payer: Self-pay | Admitting: Physical Medicine and Rehabilitation

## 2016-01-28 ENCOUNTER — Inpatient Hospital Stay (HOSPITAL_COMMUNITY)
Admission: RE | Admit: 2016-01-28 | Discharge: 2016-02-04 | DRG: 092 | Disposition: A | Discharge status: Home Health | Payer: PPO | Source: Intra-hospital | Attending: Physical Medicine & Rehabilitation | Admitting: Physical Medicine & Rehabilitation

## 2016-01-28 DIAGNOSIS — K219 Gastro-esophageal reflux disease without esophagitis: Secondary | ICD-10-CM | POA: Diagnosis present

## 2016-01-28 DIAGNOSIS — R269 Unspecified abnormalities of gait and mobility: Secondary | ICD-10-CM | POA: Diagnosis present

## 2016-01-28 DIAGNOSIS — Z886 Allergy status to analgesic agent status: Secondary | ICD-10-CM

## 2016-01-28 DIAGNOSIS — T40605A Adverse effect of unspecified narcotics, initial encounter: Secondary | ICD-10-CM | POA: Diagnosis present

## 2016-01-28 DIAGNOSIS — G8929 Other chronic pain: Secondary | ICD-10-CM | POA: Diagnosis present

## 2016-01-28 DIAGNOSIS — E871 Hypo-osmolality and hyponatremia: Secondary | ICD-10-CM | POA: Diagnosis present

## 2016-01-28 DIAGNOSIS — G8220 Paraplegia, unspecified: Secondary | ICD-10-CM | POA: Diagnosis present

## 2016-01-28 DIAGNOSIS — Z981 Arthrodesis status: Secondary | ICD-10-CM | POA: Diagnosis not present

## 2016-01-28 DIAGNOSIS — M4714 Other spondylosis with myelopathy, thoracic region: Secondary | ICD-10-CM | POA: Diagnosis present

## 2016-01-28 DIAGNOSIS — N179 Acute kidney failure, unspecified: Secondary | ICD-10-CM

## 2016-01-28 DIAGNOSIS — D62 Acute posthemorrhagic anemia: Secondary | ICD-10-CM | POA: Diagnosis present

## 2016-01-28 DIAGNOSIS — L219 Seborrheic dermatitis, unspecified: Secondary | ICD-10-CM | POA: Diagnosis present

## 2016-01-28 DIAGNOSIS — E785 Hyperlipidemia, unspecified: Secondary | ICD-10-CM | POA: Diagnosis present

## 2016-01-28 DIAGNOSIS — K5903 Drug induced constipation: Secondary | ICD-10-CM | POA: Diagnosis present

## 2016-01-28 DIAGNOSIS — M545 Low back pain: Secondary | ICD-10-CM | POA: Diagnosis present

## 2016-01-28 DIAGNOSIS — M792 Neuralgia and neuritis, unspecified: Secondary | ICD-10-CM

## 2016-01-28 DIAGNOSIS — K760 Fatty (change of) liver, not elsewhere classified: Secondary | ICD-10-CM | POA: Diagnosis present

## 2016-01-28 DIAGNOSIS — Z91048 Other nonmedicinal substance allergy status: Secondary | ICD-10-CM | POA: Diagnosis not present

## 2016-01-28 DIAGNOSIS — Z6841 Body Mass Index (BMI) 40.0 and over, adult: Secondary | ICD-10-CM | POA: Diagnosis not present

## 2016-01-28 DIAGNOSIS — K592 Neurogenic bowel, not elsewhere classified: Secondary | ICD-10-CM | POA: Diagnosis present

## 2016-01-28 DIAGNOSIS — M5410 Radiculopathy, site unspecified: Secondary | ICD-10-CM

## 2016-01-28 DIAGNOSIS — M4804 Spinal stenosis, thoracic region: Secondary | ICD-10-CM | POA: Diagnosis not present

## 2016-01-28 DIAGNOSIS — I10 Essential (primary) hypertension: Secondary | ICD-10-CM | POA: Diagnosis present

## 2016-01-28 DIAGNOSIS — Z79899 Other long term (current) drug therapy: Secondary | ICD-10-CM

## 2016-01-28 DIAGNOSIS — R0781 Pleurodynia: Secondary | ICD-10-CM

## 2016-01-28 DIAGNOSIS — G8918 Other acute postprocedural pain: Secondary | ICD-10-CM | POA: Diagnosis not present

## 2016-01-28 DIAGNOSIS — Z87891 Personal history of nicotine dependence: Secondary | ICD-10-CM

## 2016-01-28 HISTORY — DX: Prediabetes: R73.03

## 2016-01-28 LAB — GLUCOSE, CAPILLARY
GLUCOSE-CAPILLARY: 103 mg/dL — AB (ref 65–99)
GLUCOSE-CAPILLARY: 114 mg/dL — AB (ref 65–99)
GLUCOSE-CAPILLARY: 98 mg/dL (ref 65–99)
Glucose-Capillary: 143 mg/dL — ABNORMAL HIGH (ref 65–99)
Glucose-Capillary: 98 mg/dL (ref 65–99)

## 2016-01-28 MED ORDER — BISACODYL 10 MG RE SUPP: 10.0000 mg | Freq: Every day | RECTAL | Status: DC | PRN

## 2016-01-28 MED ORDER — GUAIFENESIN-DM 100-10 MG/5ML PO SYRP: 5.0000 mL | ORAL_SOLUTION | Freq: Four times a day (QID) | ORAL | Status: DC | PRN

## 2016-01-28 MED ORDER — FENTANYL 12 MCG/HR TD PT72
50.0000 ug | MEDICATED_PATCH | TRANSDERMAL | Status: DC
  Administered 2016-01-31: 50 ug via TRANSDERMAL
  Filled 2016-01-28 (×2): qty 4

## 2016-01-28 MED ORDER — ALUM & MAG HYDROXIDE-SIMETH 200-200-20 MG/5ML PO SUSP: 30.0000 mL | ORAL | Status: DC | PRN

## 2016-01-28 MED ORDER — ALUM & MAG HYDROXIDE-SIMETH 200-200-20 MG/5ML PO SUSP: 30.0000 mL | Freq: Four times a day (QID) | ORAL | Status: DC | PRN

## 2016-01-28 MED ORDER — SENNA 8.6 MG PO TABS: 1.0000 | ORAL_TABLET | Freq: Two times a day (BID) | ORAL | Status: DC

## 2016-01-28 MED ORDER — PROCHLORPERAZINE 25 MG RE SUPP: 12.5000 mg | Freq: Four times a day (QID) | RECTAL | Status: DC | PRN

## 2016-01-28 MED ORDER — TRAZODONE HCL 50 MG PO TABS: 25.0000 mg | ORAL_TABLET | Freq: Every evening | ORAL | Status: DC | PRN

## 2016-01-28 MED ORDER — PROCHLORPERAZINE EDISYLATE 5 MG/ML IJ SOLN: 5.0000 mg | Freq: Four times a day (QID) | INTRAMUSCULAR | Status: DC | PRN

## 2016-01-28 MED ORDER — ACETAMINOPHEN 325 MG PO TABS
325.0000 mg | ORAL_TABLET | ORAL | Status: DC | PRN
  Administered 2016-01-30 (×2): 650 mg via ORAL
  Filled 2016-01-28 (×2): qty 2

## 2016-01-28 MED ORDER — SENNOSIDES-DOCUSATE SODIUM 8.6-50 MG PO TABS
2.0000 | ORAL_TABLET | Freq: Every day | ORAL | Status: DC
  Administered 2016-01-28 – 2016-02-03 (×7): 2 via ORAL
  Filled 2016-01-28 (×7): qty 2

## 2016-01-28 MED ORDER — POLYETHYLENE GLYCOL 3350 17 G PO PACK: 17.0000 g | PACK | Freq: Every day | ORAL | Status: DC | PRN

## 2016-01-28 MED ORDER — FAMOTIDINE 20 MG PO TABS
20.0000 mg | ORAL_TABLET | Freq: Two times a day (BID) | ORAL | Status: DC
  Administered 2016-01-28 – 2016-02-04 (×14): 20 mg via ORAL
  Filled 2016-01-28 (×14): qty 1

## 2016-01-28 MED ORDER — DIPHENHYDRAMINE HCL 12.5 MG/5ML PO ELIX: 12.5000 mg | ORAL_SOLUTION | Freq: Four times a day (QID) | ORAL | Status: DC | PRN

## 2016-01-28 MED ORDER — PHENOL 1.4 % MT LIQD: 1.0000 | OROMUCOSAL | Status: DC | PRN

## 2016-01-28 MED ORDER — TRAZODONE HCL 50 MG PO TABS
50.0000 mg | ORAL_TABLET | Freq: Every evening | ORAL | Status: DC | PRN
  Administered 2016-01-30 – 2016-01-31 (×2): 50 mg via ORAL
  Filled 2016-01-28 (×2): qty 1

## 2016-01-28 MED ORDER — METHOCARBAMOL 750 MG PO TABS
750.0000 mg | ORAL_TABLET | Freq: Four times a day (QID) | ORAL | Status: DC | PRN
  Administered 2016-01-28 – 2016-02-03 (×10): 750 mg via ORAL
  Filled 2016-01-28 (×12): qty 1

## 2016-01-28 MED ORDER — INSULIN ASPART 100 UNIT/ML ~~LOC~~ SOLN
0.0000 [IU] | Freq: Three times a day (TID) | SUBCUTANEOUS | Status: DC
  Administered 2016-01-29: 2 [IU] via SUBCUTANEOUS
  Administered 2016-02-01 – 2016-02-02 (×2): 3 [IU] via SUBCUTANEOUS
  Administered 2016-02-03 (×2): 2 [IU] via SUBCUTANEOUS

## 2016-01-28 MED ORDER — ENOXAPARIN SODIUM 40 MG/0.4ML ~~LOC~~ SOLN
40.0000 mg | SUBCUTANEOUS | Status: DC
  Administered 2016-01-28 – 2016-02-01 (×5): 40 mg via SUBCUTANEOUS
  Filled 2016-01-28 (×5): qty 0.4

## 2016-01-28 MED ORDER — OXYCODONE HCL 5 MG PO TABS
15.0000 mg | ORAL_TABLET | ORAL | Status: DC | PRN
  Administered 2016-01-28 – 2016-02-04 (×19): 15 mg via ORAL
  Filled 2016-01-28 (×19): qty 3

## 2016-01-28 MED ORDER — POLYETHYLENE GLYCOL 3350 17 G PO PACK
17.0000 g | PACK | Freq: Two times a day (BID) | ORAL | Status: DC
  Administered 2016-01-28 – 2016-02-04 (×13): 17 g via ORAL
  Filled 2016-01-28 (×14): qty 1

## 2016-01-28 MED ORDER — FLEET ENEMA 7-19 GM/118ML RE ENEM
1.0000 | ENEMA | Freq: Once | RECTAL | Status: AC | PRN
  Administered 2016-01-29: 1 via RECTAL
  Filled 2016-01-28 (×2): qty 1

## 2016-01-28 MED ORDER — PROCHLORPERAZINE MALEATE 5 MG PO TABS: 5.0000 mg | ORAL_TABLET | Freq: Four times a day (QID) | ORAL | Status: DC | PRN

## 2016-01-28 MED ORDER — OXYCODONE HCL 5 MG PO TABS
15.0000 mg | ORAL_TABLET | Freq: Two times a day (BID) | ORAL | Status: DC
  Administered 2016-01-29 – 2016-01-30 (×3): 15 mg via ORAL
  Filled 2016-01-28 (×3): qty 3

## 2016-01-28 NOTE — PMR Pre-admission (Signed)
PMR Admission Coordinator Pre-Admission Assessment  Patient: Russell Gonzales is an 65 y.o., male MRN: [id number] DOB: [birth year] Height: '5\' 6"'$  (167.6 cm) Weight: 109.6 kg (241 lb 10 oz)              Insurance Information HMO:      PPO: Yes     PCP:       IPA:       80/20:       OTHER:   PRIMARY: [employer]      Policy#: [id number]      Subscriber:  Russell Gonzales CM Name:        Phone#:       Fax#: [phone] Pre-Cert#: [id number]      Employer:  Retired Benefits:  Phone #: [phone]     Name:  Russell Gonzales. Date: 06/12/14     Deduct:  $0      Out of Pocket Max:  $3400 (met $783.57)      Life Max: unlimited CIR: $225 days 1-6      SNF: $0 days 1-20; $150 days 21-100 Outpatient: medical necessity     Co-Pay: $15 Home Health: in network/out of network      Co-Pay: $25/ $45 copay DME:  80%     Co-Pay: 20% Providers: in network  Medicaid Application Date:        Case Manager:   Disability Application Date:        Case Worker:    Emergency Facilities manager Information    Name Relation Home Work [place] Spouse [phone]  [phone]     Current Medical History  Patient Admitting Diagnosis:  Thoracic myelopathy  History of Present Illness: A 65 y.o. right handed male with history of hypertension, ACDF 2009, chronic lower back pain status post decompression and fusion L2-S2. Patient with history of decompressive thoracic laminectomy for spinal stenosis/myelopathy T11 and T12-March 2017 per Dr. Sherley Bounds. Per chart review patient lives with spouse in [place]. Independent with assistive device prior to admission. Currently living in a mobile home with 3 steps to entry and building a home in [place] with anticipation to move soon.. Presented 01/18/2016 with difficulty in ambulation and significant myelopathy. Myelogram showed degenerative scoliosis across the thoracic lumbar spine at T11-T12 and significant stenosis. Underwent decompressive laminectomy  from T11-L1, pedicle screw fixation T7-T8-T9 and left T10 with rods stabilization from T7-L2 01/18/2016 per Dr. Ellene Route. Hospital course pain management. Postoperatively needed vent management. Back brace when out of bed. Bouts of orthostatic hypotension. Acute blood loss anemia 8.0-8.6. Physical therapy evaluation completed and ongoing. Request made for physical medicine rehabilitation consult.  Patient resting comfortably in bed. He states he is taking oral pain medications. He has not moved his bowels since surgery. Has been using toilet for bladder needs with assistance from CNA.    Past Medical History  Past Medical History:  Diagnosis Date  . Arthritis    "severe in my back" (08/11/2015)  . Chronic lower back pain   . Fatty liver   . GERD (gastroesophageal reflux disease)   . Hemorrhoids   . Hyperlipidemia   . Hypertension   . Low iron   . Seasonal allergies   . Shortness of breath dyspnea    With exertion    Family History  family history includes Pancreatic cancer in his mother; Prostate cancer in his father.  Prior Rehab/Hospitalizations: Had outpatient therapy at [facility] most recently  Has the patient had major surgery during 100  days prior to admission? No.  Does report back surgery in March 2017.  Current Medications   Current Facility-Administered Medications:  .  0.9 %  sodium chloride infusion, , Intravenous, Continuous, Bevelyn Ngo, NP, Last Rate: 10 mL/hr at 01/21/16 1149, 10 mL/hr at 01/21/16 1149 .  alum & mag hydroxide-simeth (MAALOX/MYLANTA) 200-200-20 MG/5ML suspension 30 mL, 30 mL, Oral, Q6H PRN, [name], MD, 30 mL at 01/21/16 1822 .  bisacodyl (DULCOLAX) suppository 10 mg, 10 mg, Rectal, Daily PRN, [name], MD, 10 mg at 01/27/16 1018 .  diazepam (VALIUM) tablet 5 mg, 5 mg, Oral, Q8H PRN, Tressie Stalker, MD, 5 mg at 01/27/16 0517 .  diphenhydrAMINE (BENADRYL) injection 25 mg, 25 mg, Intravenous, Q6H PRN, Karl Ito, MD, 25 mg at 01/18/16  2344 .  famotidine (PEPCID) tablet 20 mg, 20 mg, Oral, BID, [name], MD, 20 mg at 01/28/16 1051 .  fentaNYL (DURAGESIC - dosed mcg/hr) 75 mcg, 75 mcg, Transdermal, Q72H, [name], MD, 75 mcg at 01/28/16 1051 .  insulin aspart (novoLOG) injection 0-15 Units, 0-15 Units, Subcutaneous, TID AC & HS, [name], MD, 2 Units at 01/28/16 1233 .  magnesium hydroxide (MILK OF MAGNESIA) suspension 30 mL, 30 mL, Oral, QHS PRN, [name], MD, 30 mL at 01/26/16 2134 .  ondansetron (ZOFRAN) injection 4 mg, 4 mg, Intravenous, Q4H PRN, [name], MD, 4 mg at 01/22/16 0918 .  oxyCODONE (Oxy IR/ROXICODONE) immediate release tablet 15 mg, 15 mg, Oral, Q4H PRN, Donalee Citrin, MD, 15 mg at 01/28/16 0733 .  [DISCONTINUED] menthol-cetylpyridinium (CEPACOL) lozenge 3 mg, 1 lozenge, Oral, PRN **OR** phenol (CHLORASEPTIC) mouth spray 1 spray, 1 spray, Mouth/Throat, PRN, [name], MD .  polyethylene glycol (MIRALAX / GLYCOLAX) packet 17 g, 17 g, Oral, Daily PRN, [name], MD, 17 g at 01/24/16 1740 .  senna (SENOKOT) tablet 8.6 mg, 1 tablet, Oral, BID, [name], MD, 8.6 mg at 01/28/16 1051  Patients Current Diet: Diet heart healthy/carb modified Room service appropriate? Yes; Fluid consistency: Thin  Precautions / Restrictions Precautions Precautions: Fall, Back Precaution Booklet Issued: Yes (comment) Precaution Comments: reviewed precautions; pt able to recall 2/3 beginning of session Spinal Brace: Thoracolumbosacral orthotic, Applied in sitting position Restrictions Weight Bearing Restrictions: No   Has the patient had 2 or more falls or a fall with injury in the past year?Yes.  Patient reports 3 falls with no injury.  Prior Activity Level Community (5-7x/wk): Went out daily, was driving.  Home Assistive Devices / Equipment Home Assistive Devices/Equipment: Gilmer Mor (specify quad or straight) Home Equipment: Walker - 4 wheels, Cane - single point, Grab bars - tub/shower, Shower seat  Prior  Device Use: Indicate devices/aids used by the patient prior to current illness, exacerbation or injury? Walker.  Most recently was independent without a device.  Has used walker in the past few months.  Prior Functional Level Prior Function Level of Independence: Independent with assistive device(s) Comments: increased difficutly ambulating just prior to admission with dragging L leg (wife assisted wtih socks)  Self Care: Did the patient need help bathing, dressing, using the toilet or eating?  Independent  Indoor Mobility: Did the patient need assistance with walking from room to room (with or without device)? Independent  Stairs: Did the patient need assistance with internal or external stairs (with or without device)? Independent  Functional Cognition: Did the patient need help planning regular tasks such as shopping or remembering to take medications? Independent  Current Functional Level Cognition  Overall Cognitive  Status: Within Functional Limits for tasks assessed Orientation Level: Oriented X4    Extremity Assessment (includes Sensation/Coordination)  Upper Extremity Assessment: Overall WFL for tasks assessed  Lower Extremity Assessment: Defer to PT evaluation    ADLs  Overall ADL's : Needs assistance/impaired Eating/Feeding: Set up Grooming: Wash/dry hands, Minimal assistance, Standing Grooming Details (indicate cue type and reason): heavy leaning on sink surface with bil Ue due to balance deficits and L side pain Upper Body Bathing: Minimal assitance, Sitting Lower Body Bathing: Maximal assistance, Sit to/from stand Upper Body Dressing : Maximal assistance Upper Body Dressing Details (indicate cue type and reason): including TLSO Lower Body Dressing: Maximal assistance, Sit to/from stand Toilet Transfer: Minimal assistance, RW Toilet Transfer Details (indicate cue type and reason): static standing to void bladder in urinal  Functional mobility during ADLs: Minimal  assistance, Rolling walker General ADL Comments: pt required total (A) to don TLSO. pt requires BIL UE supported to don TLSO due to discomfort with static sitting. pt with L lateral flank pain and reports it increasing pain throughout session. Pt reports pain only remains in L Lateral flank area. Pt states "i could do more if it wasnt for this pain. What could this be?"     Mobility  Overal bed mobility: Needs Assistance Bed Mobility: Rolling, Sidelying to Sit Rolling: Supervision Sidelying to sit: Supervision Supine to sit: Supervision Sit to sidelying: Min guard General bed mobility comments: used rail for rolling and to sit, increased time, assist for guiding legs onto bed    Transfers  Overall transfer level: Needs assistance Equipment used: Rolling walker (2 wheeled) Transfers: Sit to/from Stand Sit to Stand: Min assist General transfer comment: assist for walker safety and balance    Ambulation / Gait / Stairs / Wheelchair Mobility  Ambulation/Gait Ambulation/Gait assistance: Physicist, medical (Feet): 90 Feet Assistive device: Rolling walker (2 wheeled) Gait Pattern/deviations: Shuffle, Decreased stride length, Step-through pattern (knees flexed) General Gait Details: heavy UE support on walker and legs giving away at times (shrinking shorter with increased knee flexion)  cues for tall posture Gait velocity: decreased Gait velocity interpretation: Below normal speed for age/gender    Posture / Balance Dynamic Sitting Balance Sitting balance - Comments: Sat EOB attempting to take a BP (not accurate on monitor, likely due to pt moving and using his arm to prop up in sitting).  Pt continued to report lightheadedness in sitting to the point that he needed to be positioned back to sidelying and then supine as he felt pre-syncopal. RN made aware and took a manual BP.  Balance Overall balance assessment: Needs assistance Sitting-balance support: Bilateral upper extremity  supported, Feet supported Sitting balance-Leahy Scale: Fair Sitting balance - Comments: Sat EOB attempting to take a BP (not accurate on monitor, likely due to pt moving and using his arm to prop up in sitting).  Pt continued to report lightheadedness in sitting to the point that he needed to be positioned back to sidelying and then supine as he felt pre-syncopal. RN made aware and took a manual BP.  Standing balance support: During functional activity, Bilateral upper extremity supported Standing balance-Leahy Scale: Poor Standing balance comment: UE support needed for standing and only tolerates short time standing    Special needs/care consideration BiPAP/CPAP No CPM No Continuous Drip IV No Dialysis No         Life Vest No Oxygen No Special Bed No Trach Size No Wound Vac (area) No    Skin Has surgical back  incision.  Has a TLSO back brace.                             Bowel mgmt: Last BM 01/27/16 Bladder mgmt: Voiding up in bathroom with assistance Diabetic mgmt No   Previous Home Environment Living Arrangements: Spouse/significant other Available Help at Discharge: Family, Available PRN/intermittently Type of Home: Mobile home Home Layout: One level Home Access: Stairs to enter Entrance Stairs-Rails: Left (wall on the right) Entrance Stairs-Number of Steps: 3 Bathroom Shower/Tub: Chiropodist: Handicapped height Bathroom Accessibility: No Home Care Services: No  Discharge Living Setting Plans for Discharge Living Setting: Lives with (comment), Mobile Home (Lives with wife.) Type of Home at Discharge: Mobile home (Single wide mobile home.) Discharge Home Layout: One level Discharge Home Access: Stairs to enter Entrance Stairs-Number of Steps: 3 Does the patient have any problems obtaining your medications?: No  Social/Family/Support Systems Patient Roles: Spouse Contact Information: Milos Milligan - wife Anticipated Caregiver: wife Anticipated  Caregiver's Contact Information: [name] - wife - [phone] Ability/Limitations of Caregiver: Wife works days. Caregiver Availability: Intermittent Discharge Plan Discussed with Primary Caregiver: Yes Is Caregiver In Agreement with Plan?: Yes Does Caregiver/Family have Issues with Lodging/Transportation while Pt is in Rehab?: No  Goals/Additional Needs Patient/Family Goal for Rehab: PT/OT mod I goals Expected length of stay: 7 days Cultural Considerations: [employer] Dietary Needs: Heart healthy, carb modified, thin liquids Equipment Needs: TBD Pt/Family Agrees to Admission and willing to participate: Yes Program Orientation Provided & Reviewed with Pt/Caregiver Including Roles  & Responsibilities: Yes  Decrease burden of Care through IP rehab admission: N/A  Possible need for SNF placement upon discharge: Not planned  Patient Condition: This patient's medical and functional status has changed since the consult dated: 01/26/2016 in which the Rehabilitation Physician determined and documented that the patient's condition is appropriate for intensive rehabilitative care in an inpatient rehabilitation facility. See "History of Present Illness" (above) for medical update. Functional changes are: Currently requiring minguard assist to ambulate 90 feet RW and min assist with toilet transfers. Patient's medical and functional status update has been discussed with the Rehabilitation physician and patient remains appropriate for inpatient rehabilitation. Will admit to inpatient rehab today.   Preadmission Screen Completed By:  [name], 01/28/2016 1:05 PM ______________________________________________________________________   Discussed status with Dr. Naaman Plummer on 01/28/16 at 1305 and received telephone approval for admission today.  Admission Coordinator:  [name], time1305/Date08/18/17

## 2016-01-28 NOTE — Progress Notes (Signed)
Russell Diones, RN Rehab Admission Coordinator Signed Physical Medicine and Rehabilitation  PMR Pre-admission Date of Service: 01/28/2016 12:52 PM  Related encounter: Admission (Discharged) from 01/18/2016 in [place]       '[]'$ Hide copied text PMR Admission Coordinator Pre-Admission Assessment  Patient: Russell Gonzales is an 65 y.o., male MRN: [id number] DOB: July 01, 1950 Height: '5\' 6"'$  (167.6 cm) Weight: 109.6 kg (241 lb 10 oz)                                                                                                                                                                                                                                                                          Insurance Information HMO:      PPO: Yes     PCP:       IPA:       80/20:       OTHER:   PRIMARY: [employer]      Policy#: [id number]      Subscriber:  [employer] CM Name:        Phone#:       Fax#: [phone] Pre-Cert#: [id number]      Employer:  Retired Benefits:  Phone #: [phone]     Name:  [name]. Date: 06/12/14     Deduct:  $0      Out of Pocket Max:  $3400 (met $783.57)      Life Max: unlimited CIR: $225 days 1-6      SNF: $0 days 1-20; $150 days 21-100 Outpatient: medical necessity     Co-Pay: $15 Home Health: in network/out of network      Co-Pay: $25/ $45 copay DME:  80%     Co-Pay: 20% Providers: in network  Medicaid Application Date:        Case Manager:   Disability Application Date:        Case Worker:    Emergency Tax adviser Information    Name Relation Home Work [name] Spouse [phone]  [phone]     Current Medical History  Patient Admitting Diagnosis:  Thoracic myelopathy  History of Present Illness:A 65 y.o.right  handed malewith history of hypertension, ACDF 2009, chronic lower back pain status post decompression and fusion L2-S2. Patient with history of  decompressive thoracic laminectomy for spinal stenosis/myelopathy T11 and T12-March 2017 per Dr. Sherley Bounds. Per chart review patient lives with spousein [place]. Independent with assistive device prior to admission.Currently living in a mobile home with 3 steps to entry and building a home in [place] with anticipation to move soon.. Presented 01/18/2016 with difficulty in ambulation and significant myelopathy. Myelogram showed degenerative scoliosis across the thoracic lumbar spine at T11-T12 and significant stenosis. Underwent decompressive laminectomy from T11-L1, pedicle screw fixation T7-T8-T9 and left T10 with rods stabilization from T7-L2 01/18/2016 per Dr. Ellene Route. Hospital course pain management. Postoperatively needed vent management. Back brace when out of bed. Bouts of orthostatic hypotension. Acute blood loss anemia 8.0-8.6. Physical therapy evaluation completed and ongoing. Request made for physical medicine rehabilitation consult.  Patient resting comfortably in bed. He states he is taking oral pain medications. He has not moved his bowels since surgery. Has been using toilet for bladder needs with assistance from CNA.    Past Medical History      Past Medical History:  Diagnosis Date  . Arthritis    "severe in my back" (08/11/2015)  . Chronic lower back pain   . Fatty liver   . GERD (gastroesophageal reflux disease)   . Hemorrhoids   . Hyperlipidemia   . Hypertension   . Low iron   . Seasonal allergies   . Shortness of breath dyspnea    With exertion    Family History  family history includes Pancreatic cancer in his mother; Prostate cancer in his father.  Prior Rehab/Hospitalizations: Had outpatient therapy at [facility] most recently  Has the patient had major surgery during 100 days prior to admission? No.  Does report back surgery in March 2017.  Current Medications   Current Facility-Administered Medications:  .  0.9 %  sodium chloride  infusion, , Intravenous, Continuous, [name], NP, Last Rate: 10 mL/hr at 01/21/16 1149, 10 mL/hr at 01/21/16 1149 .  alum & mag hydroxide-simeth (MAALOX/MYLANTA) 200-200-20 MG/5ML suspension 30 mL, 30 mL, Oral, Q6H PRN, [name], MD, 30 mL at 01/21/16 1822 .  bisacodyl (DULCOLAX) suppository 10 mg, 10 mg, Rectal, Daily PRN, [name], MD, 10 mg at 01/27/16 1018 .  diazepam (VALIUM) tablet 5 mg, 5 mg, Oral, Q8H PRN, Newman Pies, MD, 5 mg at 01/27/16 0517 .  diphenhydrAMINE (BENADRYL) injection 25 mg, 25 mg, Intravenous, Q6H PRN, Anders Simmonds, MD, 25 mg at 01/18/16 2344 .  famotidine (PEPCID) tablet 20 mg, 20 mg, Oral, BID, [name], MD, 20 mg at 01/28/16 1051 .  fentaNYL (DURAGESIC - dosed mcg/hr) 75 mcg, 75 mcg, Transdermal, Q72H, [name], MD, 75 mcg at 01/28/16 1051 .  insulin aspart (novoLOG) injection 0-15 Units, 0-15 Units, Subcutaneous, TID AC & HS, [name], MD, 2 Units at 01/28/16 1233 .  magnesium hydroxide (MILK OF MAGNESIA) suspension 30 mL, 30 mL, Oral, QHS PRN, [name], MD, 30 mL at 01/26/16 2134 .  ondansetron (ZOFRAN) injection 4 mg, 4 mg, Intravenous, Q4H PRN, [name], MD, 4 mg at 01/22/16 0918 .  oxyCODONE (Oxy IR/ROXICODONE) immediate release tablet 15 mg, 15 mg, Oral, Q4H PRN, [name], MD, 15 mg at 01/28/16 0733 .  [DISCONTINUED] menthol-cetylpyridinium (CEPACOL) lozenge 3 mg, 1 lozenge, Oral, PRN **OR** phenol (CHLORASEPTIC) mouth spray 1 spray, 1 spray, Mouth/Throat, PRN, [name], MD .  polyethylene glycol (MIRALAX /  GLYCOLAX) packet 17 g, 17 g, Oral, Daily PRN, [name], MD, 17 g at 01/24/16 1740 .  senna (SENOKOT) tablet 8.6 mg, 1 tablet, Oral, BID, [name], MD, 8.6 mg at 01/28/16 1051  Patients Current Diet: Diet heart healthy/carb modified Room service appropriate? Yes; Fluid consistency: Thin  Precautions / Restrictions Precautions Precautions: Fall, Back Precaution Booklet Issued: Yes (comment) Precaution  Comments: reviewed precautions; pt able to recall 2/3 beginning of session Spinal Brace: Thoracolumbosacral orthotic, Applied in sitting position Restrictions Weight Bearing Restrictions: No   Has the patient had 2 or more falls or a fall with injury in the past year?Yes.  Patient reports 3 falls with no injury.  Prior Activity Level Community (5-7x/wk): Went out daily, was driving.  Home Assistive Devices / Equipment Home Assistive Devices/Equipment: Gilmer Mor (specify quad or straight) Home Equipment: Walker - 4 wheels, Cane - single point, Grab bars - tub/shower, Shower seat  Prior Device Use: Indicate devices/aids used by the patient prior to current illness, exacerbation or injury? Walker.  Most recently was independent without a device.  Has used walker in the past few months.  Prior Functional Level Prior Function Level of Independence: Independent with assistive device(s) Comments: increased difficutly ambulating just prior to admission with dragging L leg (wife assisted wtih socks)  Self Care: Did the patient need help bathing, dressing, using the toilet or eating?  Independent  Indoor Mobility: Did the patient need assistance with walking from room to room (with or without device)? Independent  Stairs: Did the patient need assistance with internal or external stairs (with or without device)? Independent  Functional Cognition: Did the patient need help planning regular tasks such as shopping or remembering to take medications? Independent  Current Functional Level Cognition Overall Cognitive Status: Within Functional Limits for tasks assessed Orientation Level: Oriented X4    Extremity Assessment (includes Sensation/Coordination) Upper Extremity Assessment: Overall WFL for tasks assessed  Lower Extremity Assessment: Defer to PT evaluation   ADLs Overall ADL's : Needs assistance/impaired Eating/Feeding: Set up Grooming: Wash/dry hands, Minimal assistance,  Standing Grooming Details (indicate cue type and reason): heavy leaning on sink surface with bil Ue due to balance deficits and L side pain Upper Body Bathing: Minimal assitance, Sitting Lower Body Bathing: Maximal assistance, Sit to/from stand Upper Body Dressing : Maximal assistance Upper Body Dressing Details (indicate cue type and reason): including TLSO Lower Body Dressing: Maximal assistance, Sit to/from stand Toilet Transfer: Minimal assistance, RW Toilet Transfer Details (indicate cue type and reason): static standing to void bladder in urinal  Functional mobility during ADLs: Minimal assistance, Rolling walker General ADL Comments: pt required total (A) to don TLSO. pt requires BIL UE supported to don TLSO due to discomfort with static sitting. pt with L lateral flank pain and reports it increasing pain throughout session. Pt reports pain only remains in L Lateral flank area. Pt states "i could do more if it wasnt for this pain. What could this be?"    Mobility Overal bed mobility: Needs Assistance Bed Mobility: Rolling, Sidelying to Sit Rolling: Supervision Sidelying to sit: Supervision Supine to sit: Supervision Sit to sidelying: Min guard General bed mobility comments: used rail for rolling and to sit, increased time, assist for guiding legs onto bed   Transfers Overall transfer level: Needs assistance Equipment used: Rolling walker (2 wheeled) Transfers: Sit to/from Stand Sit to Stand: Min assist General transfer comment: assist for walker safety and balance   Ambulation / Gait / Stairs / Wheelchair Mobility Ambulation/Gait  Ambulation/Gait assistance: Min guard Ambulation Distance (Feet): 90 Feet Assistive device: Rolling walker (2 wheeled) Gait Pattern/deviations: Shuffle, Decreased stride length, Step-through pattern (knees flexed) General Gait Details: heavy UE support on walker and legs giving away at times (shrinking shorter with increased knee flexion)  cues for tall  posture Gait velocity: decreased Gait velocity interpretation: Below normal speed for age/gender   Posture / Balance Dynamic Sitting Balance Sitting balance - Comments: Sat EOB attempting to take a BP (not accurate on monitor, likely due to pt moving and using his arm to prop up in sitting).  Pt continued to report lightheadedness in sitting to the point that he needed to be positioned back to sidelying and then supine as he felt pre-syncopal. RN made aware and took a manual BP.  Balance Overall balance assessment: Needs assistance Sitting-balance support: Bilateral upper extremity supported, Feet supported Sitting balance-Leahy Scale: Fair Sitting balance - Comments: Sat EOB attempting to take a BP (not accurate on monitor, likely due to pt moving and using his arm to prop up in sitting).  Pt continued to report lightheadedness in sitting to the point that he needed to be positioned back to sidelying and then supine as he felt pre-syncopal. RN made aware and took a manual BP.  Standing balance support: During functional activity, Bilateral upper extremity supported Standing balance-Leahy Scale: Poor Standing balance comment: UE support needed for standing and only tolerates short time standing   Special needs/care consideration BiPAP/CPAP No CPM No Continuous Drip IV No Dialysis No         Life Vest No Oxygen No Special Bed No Trach Size No Wound Vac (area) No    Skin Has surgical back incision.  Has a TLSO back brace.                             Bowel mgmt: Last BM 01/27/16 Bladder mgmt: Voiding up in bathroom with assistance Diabetic mgmt No   Previous Home Environment Living Arrangements: Spouse/significant other Available Help at Discharge: Family, Available PRN/intermittently Type of Home: Mobile home Home Layout: One level Home Access: Stairs to enter Entrance Stairs-Rails: Left (wall on the right) Entrance Stairs-Number of Steps: 3 Bathroom Shower/Tub: Scientist, forensic: Handicapped height Bathroom Accessibility: No Home Care Services: No  Discharge Living Setting Plans for Discharge Living Setting: Lives with (comment), Mobile Home (Lives with wife.) Type of Home at Discharge: Mobile home (Single wide mobile home.) Discharge Home Layout: One level Discharge Home Access: Stairs to enter Entrance Stairs-Number of Steps: 3 Does the patient have any problems obtaining your medications?: No  Social/Family/Support Systems Patient Roles: Spouse Contact Information: Allenmichael Mcpartlin - wife Anticipated Caregiver: wife Anticipated Caregiver's Contact Information: [name] - wife - [phone] Ability/Limitations of Caregiver: Wife works days. Caregiver Availability: Intermittent Discharge Plan Discussed with Primary Caregiver: Yes Is Caregiver In Agreement with Plan?: Yes Does Caregiver/Family have Issues with Lodging/Transportation while Pt is in Rehab?: No  Goals/Additional Needs Patient/Family Goal for Rehab: PT/OT mod I goals Expected length of stay: 7 days Cultural Considerations: [employer] Dietary Needs: Heart healthy, carb modified, thin liquids Equipment Needs: TBD Pt/Family Agrees to Admission and willing to participate: Yes Program Orientation Provided & Reviewed with Pt/Caregiver Including Roles  & Responsibilities: Yes  Decrease burden of Care through IP rehab admission: N/A  Possible need for SNF placement upon discharge: Not planned  Patient Condition: This patient's medical and functional status has changed since the consult  dated: 01/26/2016 in which the Rehabilitation Physician determined and documented that the patient's condition is appropriate for intensive rehabilitative care in an inpatient rehabilitation facility. See "History of Present Illness" (above) for medical update. Functional changes are: Currently requiring minguard assist to ambulate 90 feet RW and min assist with toilet transfers. Patient's medical  and functional status update has been discussed with the Rehabilitation physician and patient remains appropriate for inpatient rehabilitation. Will admit to inpatient rehab today.   Preadmission Screen Completed By:  Russell Gonzales, 01/28/2016 1:05 PM ______________________________________________________________________   Discussed status with Dr. Naaman Plummer on 01/28/16 at 1305 and received telephone approval for admission today.  Admission Coordinator:  Russell Gonzales, time1305/Date08/18/17       Cosigned by: Meredith Staggers, MD at 01/28/2016 1:12 PM

## 2016-01-28 NOTE — Discharge Summary (Signed)
Physician Discharge Summary  Patient ID: Russell Gonzales MRN: [id number] DOB/AGE: 1951-05-10 65 y.o.  Admit date: 01/18/2016 Discharge date: 01/28/2016  Admission Diagnoses:Thoracolumbar kyphoscoliosis with myelopathy  Discharge Diagnoses: Thoracolumbar kyphoscoliosis with myelopathy  Active Problems:   S/P lumbar fusion   Thoracic myelopathy   Encounter for central line placement   Acute respiratory failure ([place])   Encounter for intubation   Discharged Condition: fair  Hospital Course: Patient was admitted for surgery which required 6 hour operation with substantial blood loss. Postoperatively he had hemodynamic instability during the first 24-hour period. He was maintained in the intensive care unit. He gradually recovered. He has marked weakness and myelopathy in his lower extremities. He would improve substantially with comprehensive inpatient rehabilitation.  Consults: Critical care medicine  Significant Diagnostic Studies: See record  Treatments: surgery: Decompression stabilization from T7-L2  Discharge Exam: Blood pressure 130/73, pulse 90, temperature 98.9 F (37.2 C), temperature source Oral, resp. rate 18, height 5\' 6"  (1.676 m), weight 109.6 kg (241 lb 10 oz), SpO2 96 %. Incision is clean and dry. Marked weakness in the lower extremities with 4 out of 5 proximal strength in the iliopsoas and quadriceps 3 out of 5 in the distal lower extremities. Sensation is intact.  Disposition: Comprehensive inpatient rehabilitation    Medication List    TAKE these medications   atorvastatin 20 MG tablet Commonly known as:  LIPITOR TAKE ONE TABLET BY MOUTH ONCE DAILY What changed:  See the new instructions.   dexamethasone 4 MG tablet Commonly known as:  DECADRON Take 1 tablet (4 mg total) by mouth 2 (two) times daily.   fish oil-omega-3 fatty acids 1000 MG capsule Take 1 g by mouth 2 (two) times daily.   hydrochlorothiazide 12.5 MG capsule Commonly known as:   MICROZIDE Take 1 capsule (12.5 mg total) by mouth daily.   HYDROcodone-acetaminophen 5-325 MG tablet Commonly known as:  NORCO Take 1 tablet by mouth every 6 (six) hours as needed for severe pain.   ketoconazole 2 % cream Commonly known as:  NIZORAL Apply 1 application topically daily as needed for irritation. Reported on 07/01/2015   lisinopril 40 MG tablet Commonly known as:  PRINIVIL,ZESTRIL Take 1 tablet daily What changed:  how much to take  how to take this  when to take this  additional instructions   MULTIVITAMIN ADULT PO Take 1 tablet by mouth daily.   traMADol 50 MG tablet Commonly known as:  ULTRAM Take 1 tablet (50 mg total) by mouth every 6 (six) hours as needed for moderate pain.        SignedEarleen Newport 01/28/2016, 10:07 AM

## 2016-01-28 NOTE — H&P (Signed)
Physical Medicine and Rehabilitation Admission H&P    CC: thoracic myelopathy   HPI: Russell Gonzales is a 65 y.o. 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 He was admitted on 01/18/16 for decompressive lam T 10- L1 with by Dr. Ellene Route. Post op course complicated by ABLA--5 L blood loss as well as hypoxia and hypotension. PCCM consulted for management of shock, AKI and electrolyte abnormality.  He was placed on empiric antibiotics due to elevated procalcitonin and tolerated extubated on 08/09.  Was noted to have elevated INR- 1.73 with transient hematuria--DIC panel negative.  As medical issues stabilized antibiotics discontinued and PCCM signed off.  Pain control has improved with improvement in mobility. CIR recommended by MD and rehab team for follow up therapy.    Review of Systems  Constitutional: Negative for chills and fever.  HENT: Negative for hearing loss.   Eyes: Negative.   Respiratory: Negative.   Cardiovascular: Positive for chest pain.  Gastrointestinal: Positive for constipation.  Skin: Negative for rash.  Neurological: Negative for headaches.   Past Medical History:  Diagnosis Date  . Arthritis    "severe in my back" (08/11/2015)  . Chronic lower back pain   . Fatty liver   . GERD (gastroesophageal reflux disease)   . Hemorrhoids   . Hyperlipidemia   . Hypertension   . Low iron   . Seasonal allergies   . Shortness of breath dyspnea    With exertion   Past Surgical History:  Procedure Laterality Date  . ANKLE FRACTURE SURGERY Left 1972   "it was crushed; has screws and plates"  . ANTERIOR CERVICAL DECOMP/DISCECTOMY FUSION  05/2008  . BACK SURGERY    . COLONOSCOPY W/ BIOPSIES AND POLYPECTOMY    . FRACTURE SURGERY    . KNEE  ARTHROSCOPY Left 1970s   "removed cyst"  . LUMBAR LAMINECTOMY/DECOMPRESSION MICRODISCECTOMY N/A 08/11/2015   Procedure: Laminectomy - Thoracic eleven - Thoracic tweleve;  Surgeon: Eustace Moore, MD;  Location: [facility];  Service: Neurosurgery;  Laterality: N/A;  . POLYPECTOMY  2016  . POSTERIOR LUMBAR FUSION  04/2008   "L1-S1; have rods and screws between 4 vertebrae"  . POSTERIOR LUMBAR FUSION 4 LEVEL N/A 01/18/2016   Procedure: Thoracic Eight-Lumbar Two Laminectomy with posterior decompression/segmental fixation;  Surgeon: [name], MD;  Location: [facility];  Service: Neurosurgery;  Laterality: N/A;  T8 to L2 Laminectomy with posterior decompression/segmental fixation from T8 to L2  . PROSTATE BIOPSY  2012  . THORACIC LAMINECTOMY  08/11/2015   Decompressive thoracic laminectomy, medial facetectomy and foraminotomy T11 and T12   Family History  Problem Relation Age of Onset  . Pancreatic cancer Mother   . Prostate cancer Father   . Colon cancer Neg Hx    Social History:  reports that he has quit smoking. His smoking use included Cigars. He has a 15.00 pack-year smoking history. He has never used smokeless tobacco. He reports that he drinks about 21.0 oz of alcohol per week . He reports that he uses drugs, including Other-see comments. Allergies:  Allergies  Allergen Reactions  . Aspirin Hives  . Bc Fast Pain [Aspirin-Caffeine] Hives  . Ancef [Cefazolin] Rash    RASH  . Tape Rash    Surgical Tape- 2009ish  Back surgery - broke out- rash  Facility-Administered Medications Prior to Admission  Medication Dose Route Frequency Provider Last Rate Last Dose  . 0.9 %  sodium chloride infusion  500 mL Intravenous Continuous Ladene Artist, MD       Medications Prior to Admission  Medication Sig Dispense Refill  . atorvastatin (LIPITOR) 20 MG tablet TAKE ONE TABLET BY MOUTH ONCE DAILY (Patient taking differently: TAKE ONE TABLET (20 mg) BY MOUTH every evening) 30 tablet 11  .  dexamethasone (DECADRON) 4 MG tablet Take 1 tablet (4 mg total) by mouth 2 (two) times daily. 30 tablet 2  . lisinopril (PRINIVIL,ZESTRIL) 40 MG tablet Take 1 tablet daily (Patient taking differently: Take 40 mg by mouth every evening. ) 30 tablet 11  . Multiple Vitamins-Minerals (MULTIVITAMIN ADULT PO) Take 1 tablet by mouth daily.     . traMADol (ULTRAM) 50 MG tablet Take 1 tablet (50 mg total) by mouth every 6 (six) hours as needed for moderate pain. 60 tablet 3  . fish oil-omega-3 fatty acids 1000 MG capsule Take 1 g by mouth 2 (two) times daily.      . hydrochlorothiazide (MICROZIDE) 12.5 MG capsule Take 1 capsule (12.5 mg total) by mouth daily. 30 capsule 11  . HYDROcodone-acetaminophen (NORCO) 5-325 MG tablet Take 1 tablet by mouth every 6 (six) hours as needed for severe pain. 30 tablet 0  . ketoconazole (NIZORAL) 2 % cream Apply 1 application topically daily as needed for irritation. Reported on 07/01/2015      Home: Home Living Family/patient expects to be discharged to:: Private residence Living Arrangements: Spouse/significant other Available Help at Discharge: Family, Available PRN/intermittently Type of Home: Mobile home Home Access: Stairs to enter Entrance Stairs-Number of Steps: 3 Entrance Stairs-Rails: Left (wall on the right) Home Layout: One level Bathroom Shower/Tub: Chiropodist: Handicapped height Bathroom Accessibility: No Home Equipment: Environmental consultant - 4 wheels, Cane - single point, Grab bars - tub/shower, Industrial/product designer History: Prior Function Level of Independence: Independent with assistive device(s) Comments: increased difficutly ambulating just prior to admission with dragging L leg (wife assisted wtih socks)  Functional Status:  Mobility: Bed Mobility Overal bed mobility: Needs Assistance Bed Mobility: Rolling, Sidelying to Sit Rolling: Supervision Sidelying to sit: Supervision Supine to sit: Supervision Sit to sidelying: Min  guard General bed mobility comments: used rail for rolling and to sit, increased time, assist for guiding legs onto bed Transfers Overall transfer level: Needs assistance Equipment used: Rolling walker (2 wheeled) Transfers: Sit to/from Stand Sit to Stand: Min assist General transfer comment: assist for walker safety and balance Ambulation/Gait Ambulation/Gait assistance: Min guard Ambulation Distance (Feet): 90 Feet Assistive device: Rolling walker (2 wheeled) Gait Pattern/deviations: Shuffle, Decreased stride length, Step-through pattern (knees flexed) General Gait Details: heavy UE support on walker and legs giving away at times (shrinking shorter with increased knee flexion)  cues for tall posture Gait velocity: decreased Gait velocity interpretation: Below normal speed for age/gender    ADL: ADL Overall ADL's : Needs assistance/impaired Eating/Feeding: Set up Grooming: Wash/dry hands, Minimal assistance, Standing Grooming Details (indicate cue type and reason): heavy leaning on sink surface with bil Ue due to balance deficits and L side pain Upper Body Bathing: Minimal assitance, Sitting Lower Body Bathing: Maximal assistance, Sit to/from stand Upper Body Dressing : Maximal assistance Upper Body Dressing Details (indicate cue type and reason): including TLSO Lower Body Dressing: Maximal assistance, Sit to/from stand Toilet Transfer: Minimal assistance, RW Toilet Transfer Details (indicate cue type and reason): static  standing to void bladder in urinal  Functional mobility during ADLs: Minimal assistance, Rolling walker General ADL Comments: pt required total (A) to don TLSO. pt requires BIL UE supported to don TLSO due to discomfort with static sitting. pt with L lateral flank pain and reports it increasing pain throughout session. Pt reports pain only remains in L Lateral flank area. Pt states "i could do more if it wasnt for this pain. What could this be?"    Cognition: Cognition Overall Cognitive Status: Within Functional Limits for tasks assessed Orientation Level: Oriented X4 Cognition Arousal/Alertness: Awake/alert Behavior During Therapy: WFL for tasks assessed/performed Overall Cognitive Status: Within Functional Limits for tasks assessed  Physical Exam: Blood pressure 130/73, pulse 90, temperature 98.9 F (37.2 C), temperature source Oral, resp. rate 18, height 5\' 6"  (1.676 m), weight 109.6 kg (241 lb 10 oz), SpO2 96 %. Physical Exam  Constitutional: He is oriented to person, place, and time. He appears well-developed and well-nourished.  HENT:  Head: Normocephalic and atraumatic.  Eyes: Conjunctivae are normal. Pupils are equal, round, and reactive to light.  Neck: Normal range of motion.  Cardiovascular: Normal rate and regular rhythm.  Exam reveals no gallop and no friction rub.   No murmur heard. Respiratory: Effort normal. No respiratory distress. He has no wheezes. He has no rales. He exhibits no tenderness.  GI: Bowel sounds are normal. He exhibits distension.  Genitourinary: Penis normal.  Musculoskeletal: Normal range of motion. He exhibits no edema.  Neurological: He is alert and oriented to person, place, and time. No cranial nerve deficit.  UE strength 5/5. LE: 2/5 HF, 3- KE and 4/5 ADF/PF. Decreased LT in both lower extremities to waist (1+/2). DTR's 1+ in both legs  Psychiatric: He has a normal mood and affect. His behavior is normal. Judgment and thought content normal.    Results for orders placed or performed during the hospital encounter of 01/18/16 (from the past 48 hour(s))  Glucose, capillary     Status: Abnormal   Collection Time: 01/26/16 11:27 AM  Result Value Ref Range   Glucose-Capillary 129 (H) 65 - 99 mg/dL  Glucose, capillary     Status: Abnormal   Collection Time: 01/26/16  4:26 PM  Result Value Ref Range   Glucose-Capillary 101 (H) 65 - 99 mg/dL  Glucose, capillary     Status: Abnormal    Collection Time: 01/26/16  9:29 PM  Result Value Ref Range   Glucose-Capillary 109 (H) 65 - 99 mg/dL   Comment 1 Notify RN    Comment 2 Document in Chart   Glucose, capillary     Status: Abnormal   Collection Time: 01/27/16  6:46 AM  Result Value Ref Range   Glucose-Capillary 128 (H) 65 - 99 mg/dL   Comment 1 Notify RN    Comment 2 Document in Chart   Glucose, capillary     Status: Abnormal   Collection Time: 01/27/16 11:17 AM  Result Value Ref Range   Glucose-Capillary 105 (H) 65 - 99 mg/dL   Comment 1 Notify RN    Comment 2 Document in Chart   Glucose, capillary     Status: Abnormal   Collection Time: 01/27/16  4:46 PM  Result Value Ref Range   Glucose-Capillary 103 (H) 65 - 99 mg/dL   Comment 1 Notify RN    Comment 2 Document in Chart   Glucose, capillary     Status: Abnormal   Collection Time: 01/28/16  7:04 AM  Result Value Ref  Range   Glucose-Capillary 114 (H) 65 - 99 mg/dL   Comment 1 Notify RN    Comment 2 Document in Chart    No results found.     Medical Problem List and Plan: 1.  Functional and mobility deficits  secondary to thoracic myelopathy s/pt T10-L1 decompression  2.  DVT Prophylaxis/Anticoagulation: Pharmaceutical: Lovenox 40mg  daily 3. Pain Management: decrease fentanyl patch to 69mcg and gradually further as tolerated.   -continue oxycodone for breakthrough pain  -scheduled oxycodone prior to am/afternoon therapies 4. Mood: team to provide ego support. He appears to be in good spirits.  5. Neuropsych: This patient is capable of making decisions on his own behalf. 6. Skin/Wound Care: local care to back incisions. Continue honeycomb dressing for now as wounds clean/dry 7. Fluids/Electrolytes/Nutrition: encourage PO. Check electrolytes upon admit  -poor appetite at present 8. Constipation/neurogenic bowel: augment bowel regimen given myelopathy/narcotics      Post Admission Physician Evaluation: 1. Functional deficits secondary  to thoracic  myelopathy/thoracic-lumbar stenosis . 2. Patient is admitted to receive collaborative, interdisciplinary care between the physiatrist, rehab nursing staff, and therapy team. 3. Patient's level of medical complexity and substantial therapy needs in context of that medical necessity cannot be provided at a lesser intensity of care such as a SNF. 4. Patient has experienced substantial functional loss from his/her baseline which was documented above under the "Functional History" and "Functional Status" headings.  Judging by the patient's diagnosis, physical exam, and functional history, the patient has potential for functional progress which will result in measurable gains while on inpatient rehab.  These gains will be of substantial and practical use upon discharge  in facilitating mobility and self-care at the household level. 5. Physiatrist will provide 24 hour management of medical needs as well as oversight of the therapy plan/treatment and provide guidance as appropriate regarding the interaction of the two. 6. 24 hour rehab nursing will assist with bladder management, bowel management, safety, skin/wound care, disease management, medication administration, pain management and patient education  and help integrate therapy concepts, techniques,education, etc. 7. PT will assess and treat for/with: Lower extremity strength, range of motion, stamina, balance, functional mobility, safety, adaptive techniques and equipment, pain mgt, back precautions.   Goals are: mod I to supervision. 8. OT will assess and treat for/with: ADL's, functional mobility, safety, upper extremity strength, adaptive techniques and equipment, brace don/doff, pain mgt, ego support, family education.   Goals are: mod I to min assist. Therapy may proceed with showering this patient. 9. SLP will assess and treat for/with: n/a.  Goals are: n/a. 10. Case Management and Social Worker will assess and treat for psychological issues and  discharge planning. 11. Team conference will be held weekly to assess progress toward goals and to determine barriers to discharge. 12. Patient will receive at least 3 hours of therapy per day at least 5 days per week. 13. ELOS: 8-12 days       14. Prognosis:  excellent     Meredith Staggers, MD, [facility] 01/28/2016  01/28/2016

## 2016-01-28 NOTE — Progress Notes (Signed)
Charlett Blake, MD Physician Signed Physical Medicine and Rehabilitation  Consult Note Date of Service: 01/26/2016 5:56 AM  Related encounter: Admission (Discharged) from 01/18/2016 in [place] All Collapse All   [] Hide copied text [] Hover for attribution information      Physical Medicine and Rehabilitation Consult Reason for Consult: Thoracic myelopathy with paraparesis Referring Physician: Dr. Ellene Route   HPI: Russell Gonzales is a 65 y.o. right handed male with history of hypertension, ACDF 2009, chronic lower back pain status post decompression and fusion L2-S2. Patient with history of decompressive thoracic laminectomy for spinal stenosis/myelopathy T11 and T12-March 2017 per Dr. Sherley Bounds. Per chart review patient lives with spouse in [place]. Independent with assistive device prior to admission. Currently living in a mobile home with 3 steps to entry and building a home in [place] with anticipation to move soon.. Presented 01/18/2016 with difficulty in ambulation and significant myelopathy. Myelogram showed degenerative scoliosis across the thoracic lumbar spine at T11-T12 and significant stenosis. Underwent decompressive laminectomy from T11-L1, pedicle screw fixation T7-T8-T9 and left T10 with rods stabilization from T7-L2 01/18/2016 per Dr. Ellene Route. Hospital course pain management. Postoperatively needed vent management. Back brace when out of bed. Bouts of orthostatic hypotension. Acute blood loss anemia 8.0-8.6. Physical therapy evaluation completed and ongoing. Request made for physical medicine rehabilitation consult.  Patient resting comfortably in bed. He states he is taking oral pain medications. He has not moved his bowels since surgery. Has been using toilet for bladder needs with assistance from CNA  Review of Systems  Constitutional: Negative for chills and fever.  HENT: Negative for hearing loss.   Eyes:  Negative for blurred vision and double vision.  Respiratory: Negative for cough.        Shortness of breath with exertion  Cardiovascular: Positive for leg swelling. Negative for chest pain and palpitations.  Gastrointestinal: Positive for constipation. Negative for nausea.       GERD  Genitourinary: Positive for urgency. Negative for dysuria and hematuria.  Musculoskeletal: Positive for back pain and myalgias.  Skin: Negative for rash.  Neurological: Positive for weakness. Negative for seizures and headaches.  All other systems reviewed and are negative.      Past Medical History:  Diagnosis Date  . Arthritis    "severe in my back" (08/11/2015)  . Chronic lower back pain   . Fatty liver   . GERD (gastroesophageal reflux disease)   . Hemorrhoids   . Hyperlipidemia   . Hypertension   . Low iron   . Seasonal allergies   . Shortness of breath dyspnea    With exertion        Past Surgical History:  Procedure Laterality Date  . ANKLE FRACTURE SURGERY Left 1972   "it was crushed; has screws and plates"  . ANTERIOR CERVICAL DECOMP/DISCECTOMY FUSION  05/2008  . BACK SURGERY    . COLONOSCOPY W/ BIOPSIES AND POLYPECTOMY    . FRACTURE SURGERY    . KNEE ARTHROSCOPY Left 1970s   "removed cyst"  . LUMBAR LAMINECTOMY/DECOMPRESSION MICRODISCECTOMY N/A 08/11/2015   Procedure: Laminectomy - Thoracic eleven - Thoracic tweleve;  Surgeon: Eustace Moore, MD;  Location: [facility];  Service: Neurosurgery;  Laterality: N/A;  . POLYPECTOMY  2016  . POSTERIOR LUMBAR FUSION  04/2008   "L1-S1; have rods and screws between 4 vertebrae"  . POSTERIOR LUMBAR FUSION 4 LEVEL N/A 01/18/2016   Procedure: Thoracic Eight-Lumbar Two Laminectomy with  posterior decompression/segmental fixation;  Surgeon: [name], MD;  Location: [facility];  Service: Neurosurgery;  Laterality: N/A;  T8 to L2 Laminectomy with posterior decompression/segmental fixation from T8 to L2  . PROSTATE  BIOPSY  2012  . THORACIC LAMINECTOMY  08/11/2015   Decompressive thoracic laminectomy, medial facetectomy and foraminotomy T11 and T12        Family History  Problem Relation Age of Onset  . Pancreatic cancer Mother   . Prostate cancer Father   . Colon cancer Neg Hx    Social History:  reports that he has quit smoking. His smoking use included Cigars. He has a 15.00 pack-year smoking history. He has never used smokeless tobacco. He reports that he drinks about 21.0 oz of alcohol per week . He reports that he uses drugs, including Other-see comments. Allergies:       Allergies  Allergen Reactions  . Aspirin Hives  . Bc Fast Pain [Aspirin-Caffeine] Hives  . Ancef [Cefazolin] Rash    RASH  . Tape Rash    Surgical Tape- 2009ish  Back surgery - broke out- rash            Facility-Administered Medications Prior to Admission  Medication Dose Route Frequency Provider Last Rate Last Dose  . 0.9 %  sodium chloride infusion  500 mL Intravenous Continuous Ladene Artist, MD             Medications Prior to Admission  Medication Sig Dispense Refill  . atorvastatin (LIPITOR) 20 MG tablet TAKE ONE TABLET BY MOUTH ONCE DAILY (Patient taking differently: TAKE ONE TABLET (20 mg) BY MOUTH every evening) 30 tablet 11  . dexamethasone (DECADRON) 4 MG tablet Take 1 tablet (4 mg total) by mouth 2 (two) times daily. 30 tablet 2  . lisinopril (PRINIVIL,ZESTRIL) 40 MG tablet Take 1 tablet daily (Patient taking differently: Take 40 mg by mouth every evening. ) 30 tablet 11  . Multiple Vitamins-Minerals (MULTIVITAMIN ADULT PO) Take 1 tablet by mouth daily.     . traMADol (ULTRAM) 50 MG tablet Take 1 tablet (50 mg total) by mouth every 6 (six) hours as needed for moderate pain. 60 tablet 3  . fish oil-omega-3 fatty acids 1000 MG capsule Take 1 g by mouth 2 (two) times daily.      . hydrochlorothiazide (MICROZIDE) 12.5 MG capsule Take 1 capsule (12.5 mg total) by mouth daily. 30 capsule 11    . HYDROcodone-acetaminophen (NORCO) 5-325 MG tablet Take 1 tablet by mouth every 6 (six) hours as needed for severe pain. 30 tablet 0  . ketoconazole (NIZORAL) 2 % cream Apply 1 application topically daily as needed for irritation. Reported on 07/01/2015      Home: Home Living Family/patient expects to be discharged to:: Private residence Living Arrangements: Spouse/significant other Available Help at Discharge: Family, Available PRN/intermittently Type of Home: Mobile home Home Access: Stairs to enter Entrance Stairs-Number of Steps: 3 Entrance Stairs-Rails: Left (wall on the right) Home Layout: One level Bathroom Shower/Tub: Chiropodist: Handicapped height Bathroom Accessibility: No Home Equipment: Environmental consultant - 4 wheels, Cane - single point, Grab bars - tub/shower, Careers adviser History: Prior Function Level of Independence: Independent with assistive device(s) Comments: increased difficutly ambulating just prior to admission with dragging L leg (wife assisted wtih socks) Functional Status:  Mobility: Bed Mobility Overal bed mobility: Needs Assistance Bed Mobility: Rolling, Sidelying to Sit, Sit to Sidelying Rolling: Supervision Sidelying to sit: Supervision Supine to sit: Supervision Sit to sidelying:  Min guard General bed mobility comments: carry over of sequencing and techniue; min guard for safety and increased time Transfers Overall transfer level: Needs assistance Equipment used: Rolling walker (2 wheeled) Transfers: Sit to/from Stand Sit to Stand: Min guard General transfer comment: cues for safe hand placement Ambulation/Gait Ambulation/Gait assistance: Min guard Ambulation Distance (Feet): 80 Feet Assistive device: Rolling walker (2 wheeled) Gait Pattern/deviations: Step-through pattern, Decreased stride length, Decreased step length - right, Decreased step length - left, Trunk flexed General Gait Details: cues for posture and  proximity of RW; several brief standing rest breaks; slow, guarded movements Gait velocity: decreased Gait velocity interpretation: Below normal speed for age/gender    ADL: ADL Overall ADL's : Needs assistance/impaired Eating/Feeding: Set up Grooming: Wash/dry hands, Minimal assistance, Standing Grooming Details (indicate cue type and reason): heavy leaning on sink surface with bil Ue due to balance deficits and L side pain Upper Body Bathing: Minimal assitance, Sitting Lower Body Bathing: Maximal assistance, Sit to/from stand Upper Body Dressing : Maximal assistance Upper Body Dressing Details (indicate cue type and reason): including TLSO Lower Body Dressing: Maximal assistance, Sit to/from stand Toilet Transfer: Minimal assistance, RW Toilet Transfer Details (indicate cue type and reason): static standing to void bladder in urinal  Functional mobility during ADLs: Minimal assistance, Rolling walker General ADL Comments: pt required total (A) to don TLSO. pt requires BIL UE supported to don TLSO due to discomfort with static sitting. pt with L lateral flank pain and reports it increasing pain throughout session. Pt reports pain only remains in L Lateral flank area. Pt states "i could do more if it wasnt for this pain. What could this be?"   Cognition: Cognition Overall Cognitive Status: Within Functional Limits for tasks assessed Orientation Level: Oriented X4 Cognition Arousal/Alertness: Awake/alert Behavior During Therapy: WFL for tasks assessed/performed Overall Cognitive Status: Within Functional Limits for tasks assessed  Blood pressure 129/68, pulse 85, temperature 98.2 F (36.8 C), temperature source Oral, resp. rate 18, height 5\' 6"  (1.676 m), weight 109.6 kg (241 lb 10 oz), SpO2 95 %. Physical Exam  Constitutional: He is oriented to person, place, and time.  HENT:  Head: Normocephalic.  Eyes: EOM are normal.  Neck: Normal range of motion. Neck supple. No  thyromegaly present.  Cardiovascular: Normal rate and regular rhythm.   Respiratory: Effort normal and breath sounds normal.  GI: Soft. Bowel sounds are normal.  Abdomen is mildly distended  Neurological: He is alert and oriented to person, place, and time.  Skin:  Back incision is dressed  Hyperreflexia, bilateral lower extremities. Bilateral clonus at the ankles. Motor strength is 3 minus in the hip flexors, knee extensors 4 minus, ankle dorsiflexors. Motor Strength in the upper extremities, 5/5 bilateral deltoid, biceps, triceps, grip  Lab Results Last 24 Hours       Results for orders placed or performed during the hospital encounter of 01/18/16 (from the past 24 hour(s))  Glucose, capillary     Status: Abnormal   Collection Time: 01/25/16  6:17 AM  Result Value Ref Range   Glucose-Capillary 117 (H) 65 - 99 mg/dL   Comment 1 Notify RN    Comment 2 Document in Chart   Magnesium     Status: None   Collection Time: 01/25/16  7:22 AM  Result Value Ref Range   Magnesium 2.0 1.7 - 2.4 mg/dL  Phosphorus     Status: Abnormal   Collection Time: 01/25/16  7:22 AM  Result Value Ref Range   Phosphorus  4.7 (H) 2.5 - 4.6 mg/dL  CBC with Differential/Platelet     Status: Abnormal   Collection Time: 01/25/16  7:22 AM  Result Value Ref Range   WBC 9.2 4.0 - 10.5 K/uL   RBC 2.71 (L) 4.22 - 5.81 MIL/uL   Hemoglobin 8.6 (L) 13.0 - 17.0 g/dL   HCT 25.9 (L) 39.0 - 52.0 %   MCV 95.6 78.0 - 100.0 fL   MCH 31.7 26.0 - 34.0 pg   MCHC 33.2 30.0 - 36.0 g/dL   RDW 13.0 11.5 - 15.5 %   Platelets 205 150 - 400 K/uL   Neutrophils Relative % 68 %   Neutro Abs 6.2 1.7 - 7.7 K/uL   Lymphocytes Relative 19 %   Lymphs Abs 1.8 0.7 - 4.0 K/uL   Monocytes Relative 11 %   Monocytes Absolute 1.0 0.1 - 1.0 K/uL   Eosinophils Relative 2 %   Eosinophils Absolute 0.2 0.0 - 0.7 K/uL   Basophils Relative 0 %   Basophils Absolute 0.0 0.0 - 0.1 K/uL  Glucose, capillary     Status:  Abnormal   Collection Time: 01/25/16 11:29 AM  Result Value Ref Range   Glucose-Capillary 117 (H) 65 - 99 mg/dL  Glucose, capillary     Status: Abnormal   Collection Time: 01/25/16  4:54 PM  Result Value Ref Range   Glucose-Capillary 113 (H) 65 - 99 mg/dL     Imaging Results (Last 48 hours)  No results found.    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 Does the need for close, 24 hr/day medical supervision in concert with the patient's rehab needs make it unreasonable for this patient to be served in a less intensive setting? Yes 2. Co-Morbidities requiring supervision/potential complications: Hypertension, history of cervical laminectomy, postoperative constipation 3. Due to bladder management, bowel management, safety, skin/wound care, disease management, medication administration, pain management and patient education, does the patient require 24 hr/day rehab nursing? Yes 4. Does the patient require coordinated care of a physician, rehab nurse, PT (1-2 hrs/day, 5 days/week) and OT (1-2 hrs/day, 5 days/week) to address physical and functional deficits in the context of the above medical diagnosis(es)? Yes Addressing deficits in the following areas: balance, endurance, locomotion, strength, transferring, bowel/bladder control, bathing, dressing, feeding, grooming and cognition 5. Can the patient actively participate in an intensive therapy program of at least 3 hrs of therapy per day at least 5 days per week? Yes 6. The potential for patient to make measurable gains while on inpatient rehab is excellent 7. Anticipated functional outcomes upon discharge from inpatient rehab are modified independent  with PT, modified independent with OT, n/a with SLP. 8. Estimated rehab length of stay to reach the above functional goals is: 7d  9. Does the patient have adequate social supports and living environment to accommodate these discharge functional goals? Yes and  Potentially 10. Anticipated D/C setting: Home 11. Anticipated post D/C treatments: [place] therapy 12. Overall Rehab/Functional Prognosis: excellent  RECOMMENDATIONS: This patient's condition is appropriate for continued rehabilitative care in the following setting: CIR Patient has agreed to participate in recommended program. Yes Note that insurance prior authorization may be required for reimbursement for recommended care.  Comment:     01/26/2016    Revision History                        Routing History

## 2016-01-28 NOTE — Interval H&P Note (Signed)
Russell Gonzales was admitted today to Inpatient Rehabilitation with the diagnosis of thoracic myelopathy.  The patient's history has been reviewed, patient examined, and there is no change in status.  Patient continues to be appropriate for intensive inpatient rehabilitation.  I have reviewed the patient's chart and labs.  Questions were answered to the patient's satisfaction. The PAPE has been reviewed and assessment remains appropriate.  Russell Gonzales T 01/28/2016, 4:11 PM

## 2016-01-28 NOTE — Progress Notes (Signed)
Rehab admissions - I have approval from [facility] for acute inpatient rehab admission for today.  I will follow up with patient this am.  I do have available bed on rehab today.  Call me for questions.  RC:[id number]

## 2016-01-28 NOTE — H&P (View-Only) (Signed)
Physical Medicine and Rehabilitation Admission H&P    CC: thoracic myelopathy   HPI: Russell Gonzales is a 65 y.o. 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 He was admitted on 01/18/16 for decompressive lam T 10- L1 with by Dr. Ellene Route. Post op course complicated by ABLA--5 L blood loss as well as hypoxia and hypotension. PCCM consulted for management of shock, AKI and electrolyte abnormality.  He was placed on empiric antibiotics due to elevated procalcitonin and tolerated extubated on 08/09.  Was noted to have elevated INR- 1.73 with transient hematuria--DIC panel negative.  As medical issues stabilized antibiotics discontinued and PCCM signed off.  Pain control has improved with improvement in mobility. CIR recommended by MD and rehab team for follow up therapy.    Review of Systems  Constitutional: Negative for chills and fever.  HENT: Negative for hearing loss.   Eyes: Negative.   Respiratory: Negative.   Cardiovascular: Positive for chest pain.  Gastrointestinal: Positive for constipation.  Skin: Negative for rash.  Neurological: Negative for headaches.   Past Medical History:  Diagnosis Date  . Arthritis    "severe in my back" (08/11/2015)  . Chronic lower back pain   . Fatty liver   . GERD (gastroesophageal reflux disease)   . Hemorrhoids   . Hyperlipidemia   . Hypertension   . Low iron   . Seasonal allergies   . Shortness of breath dyspnea    With exertion   Past Surgical History:  Procedure Laterality Date  . ANKLE FRACTURE SURGERY Left 1972   "it was crushed; has screws and plates"  . ANTERIOR CERVICAL DECOMP/DISCECTOMY FUSION  05/2008  . BACK SURGERY    . COLONOSCOPY W/ BIOPSIES AND POLYPECTOMY    . FRACTURE SURGERY    . KNEE  ARTHROSCOPY Left 1970s   "removed cyst"  . LUMBAR LAMINECTOMY/DECOMPRESSION MICRODISCECTOMY N/A 08/11/2015   Procedure: Laminectomy - Thoracic eleven - Thoracic tweleve;  Surgeon: Eustace Moore, MD;  Location: [facility];  Service: Neurosurgery;  Laterality: N/A;  . POLYPECTOMY  2016  . POSTERIOR LUMBAR FUSION  04/2008   "L1-S1; have rods and screws between 4 vertebrae"  . POSTERIOR LUMBAR FUSION 4 LEVEL N/A 01/18/2016   Procedure: Thoracic Eight-Lumbar Two Laminectomy with posterior decompression/segmental fixation;  Surgeon: [name], MD;  Location: [facility];  Service: Neurosurgery;  Laterality: N/A;  T8 to L2 Laminectomy with posterior decompression/segmental fixation from T8 to L2  . PROSTATE BIOPSY  2012  . THORACIC LAMINECTOMY  08/11/2015   Decompressive thoracic laminectomy, medial facetectomy and foraminotomy T11 and T12   Family History  Problem Relation Age of Onset  . Pancreatic cancer Mother   . Prostate cancer Father   . Colon cancer Neg Hx    Social History:  reports that he has quit smoking. His smoking use included Cigars. He has a 15.00 pack-year smoking history. He has never used smokeless tobacco. He reports that he drinks about 21.0 oz of alcohol per week . He reports that he uses drugs, including Other-see comments. Allergies:  Allergies  Allergen Reactions  . Aspirin Hives  . Bc Fast Pain [Aspirin-Caffeine] Hives  . Ancef [Cefazolin] Rash    RASH  . Tape Rash    Surgical Tape- 2009ish  Back surgery - broke out- rash  Facility-Administered Medications Prior to Admission  Medication Dose Route Frequency Provider Last Rate Last Dose  . 0.9 %  sodium chloride infusion  500 mL Intravenous Continuous Ladene Artist, MD       Medications Prior to Admission  Medication Sig Dispense Refill  . atorvastatin (LIPITOR) 20 MG tablet TAKE ONE TABLET BY MOUTH ONCE DAILY (Patient taking differently: TAKE ONE TABLET (20 mg) BY MOUTH every evening) 30 tablet 11  .  dexamethasone (DECADRON) 4 MG tablet Take 1 tablet (4 mg total) by mouth 2 (two) times daily. 30 tablet 2  . lisinopril (PRINIVIL,ZESTRIL) 40 MG tablet Take 1 tablet daily (Patient taking differently: Take 40 mg by mouth every evening. ) 30 tablet 11  . Multiple Vitamins-Minerals (MULTIVITAMIN ADULT PO) Take 1 tablet by mouth daily.     . traMADol (ULTRAM) 50 MG tablet Take 1 tablet (50 mg total) by mouth every 6 (six) hours as needed for moderate pain. 60 tablet 3  . fish oil-omega-3 fatty acids 1000 MG capsule Take 1 g by mouth 2 (two) times daily.      . hydrochlorothiazide (MICROZIDE) 12.5 MG capsule Take 1 capsule (12.5 mg total) by mouth daily. 30 capsule 11  . HYDROcodone-acetaminophen (NORCO) 5-325 MG tablet Take 1 tablet by mouth every 6 (six) hours as needed for severe pain. 30 tablet 0  . ketoconazole (NIZORAL) 2 % cream Apply 1 application topically daily as needed for irritation. Reported on 07/01/2015      Home: Home Living Family/patient expects to be discharged to:: Private residence Living Arrangements: Spouse/significant other Available Help at Discharge: Family, Available PRN/intermittently Type of Home: Mobile home Home Access: Stairs to enter Entrance Stairs-Number of Steps: 3 Entrance Stairs-Rails: Left (wall on the right) Home Layout: One level Bathroom Shower/Tub: Chiropodist: Handicapped height Bathroom Accessibility: No Home Equipment: Environmental consultant - 4 wheels, Cane - single point, Grab bars - tub/shower, Industrial/product designer History: Prior Function Level of Independence: Independent with assistive device(s) Comments: increased difficutly ambulating just prior to admission with dragging L leg (wife assisted wtih socks)  Functional Status:  Mobility: Bed Mobility Overal bed mobility: Needs Assistance Bed Mobility: Rolling, Sidelying to Sit Rolling: Supervision Sidelying to sit: Supervision Supine to sit: Supervision Sit to sidelying: Min  guard General bed mobility comments: used rail for rolling and to sit, increased time, assist for guiding legs onto bed Transfers Overall transfer level: Needs assistance Equipment used: Rolling walker (2 wheeled) Transfers: Sit to/from Stand Sit to Stand: Min assist General transfer comment: assist for walker safety and balance Ambulation/Gait Ambulation/Gait assistance: Min guard Ambulation Distance (Feet): 90 Feet Assistive device: Rolling walker (2 wheeled) Gait Pattern/deviations: Shuffle, Decreased stride length, Step-through pattern (knees flexed) General Gait Details: heavy UE support on walker and legs giving away at times (shrinking shorter with increased knee flexion)  cues for tall posture Gait velocity: decreased Gait velocity interpretation: Below normal speed for age/gender    ADL: ADL Overall ADL's : Needs assistance/impaired Eating/Feeding: Set up Grooming: Wash/dry hands, Minimal assistance, Standing Grooming Details (indicate cue type and reason): heavy leaning on sink surface with bil Ue due to balance deficits and L side pain Upper Body Bathing: Minimal assitance, Sitting Lower Body Bathing: Maximal assistance, Sit to/from stand Upper Body Dressing : Maximal assistance Upper Body Dressing Details (indicate cue type and reason): including TLSO Lower Body Dressing: Maximal assistance, Sit to/from stand Toilet Transfer: Minimal assistance, RW Toilet Transfer Details (indicate cue type and reason): static  standing to void bladder in urinal  Functional mobility during ADLs: Minimal assistance, Rolling walker General ADL Comments: pt required total (A) to don TLSO. pt requires BIL UE supported to don TLSO due to discomfort with static sitting. pt with L lateral flank pain and reports it increasing pain throughout session. Pt reports pain only remains in L Lateral flank area. Pt states "i could do more if it wasnt for this pain. What could this be?"    Cognition: Cognition Overall Cognitive Status: Within Functional Limits for tasks assessed Orientation Level: Oriented X4 Cognition Arousal/Alertness: Awake/alert Behavior During Therapy: WFL for tasks assessed/performed Overall Cognitive Status: Within Functional Limits for tasks assessed  Physical Exam: Blood pressure 130/73, pulse 90, temperature 98.9 F (37.2 C), temperature source Oral, resp. rate 18, height 5\' 6"  (1.676 m), weight 109.6 kg (241 lb 10 oz), SpO2 96 %. Physical Exam  Constitutional: He is oriented to person, place, and time. He appears well-developed and well-nourished.  HENT:  Head: Normocephalic and atraumatic.  Eyes: Conjunctivae are normal. Pupils are equal, round, and reactive to light.  Neck: Normal range of motion.  Cardiovascular: Normal rate and regular rhythm.  Exam reveals no gallop and no friction rub.   No murmur heard. Respiratory: Effort normal. No respiratory distress. He has no wheezes. He has no rales. He exhibits no tenderness.  GI: Bowel sounds are normal. He exhibits distension.  Genitourinary: Penis normal.  Musculoskeletal: Normal range of motion. He exhibits no edema.  Neurological: He is alert and oriented to person, place, and time. No cranial nerve deficit.  UE strength 5/5. LE: 2/5 HF, 3- KE and 4/5 ADF/PF. Decreased LT in both lower extremities to waist (1+/2). DTR's 1+ in both legs  Psychiatric: He has a normal mood and affect. His behavior is normal. Judgment and thought content normal.    Results for orders placed or performed during the hospital encounter of 01/18/16 (from the past 48 hour(s))  Glucose, capillary     Status: Abnormal   Collection Time: 01/26/16 11:27 AM  Result Value Ref Range   Glucose-Capillary 129 (H) 65 - 99 mg/dL  Glucose, capillary     Status: Abnormal   Collection Time: 01/26/16  4:26 PM  Result Value Ref Range   Glucose-Capillary 101 (H) 65 - 99 mg/dL  Glucose, capillary     Status: Abnormal    Collection Time: 01/26/16  9:29 PM  Result Value Ref Range   Glucose-Capillary 109 (H) 65 - 99 mg/dL   Comment 1 Notify RN    Comment 2 Document in Chart   Glucose, capillary     Status: Abnormal   Collection Time: 01/27/16  6:46 AM  Result Value Ref Range   Glucose-Capillary 128 (H) 65 - 99 mg/dL   Comment 1 Notify RN    Comment 2 Document in Chart   Glucose, capillary     Status: Abnormal   Collection Time: 01/27/16 11:17 AM  Result Value Ref Range   Glucose-Capillary 105 (H) 65 - 99 mg/dL   Comment 1 Notify RN    Comment 2 Document in Chart   Glucose, capillary     Status: Abnormal   Collection Time: 01/27/16  4:46 PM  Result Value Ref Range   Glucose-Capillary 103 (H) 65 - 99 mg/dL   Comment 1 Notify RN    Comment 2 Document in Chart   Glucose, capillary     Status: Abnormal   Collection Time: 01/28/16  7:04 AM  Result Value Ref  Range   Glucose-Capillary 114 (H) 65 - 99 mg/dL   Comment 1 Notify RN    Comment 2 Document in Chart    No results found.     Medical Problem List and Plan: 1.  Functional and mobility deficits  secondary to thoracic myelopathy s/pt T10-L1 decompression  2.  DVT Prophylaxis/Anticoagulation: Pharmaceutical: Lovenox 40mg  daily 3. Pain Management: decrease fentanyl patch to 48mcg and gradually further as tolerated.   -continue oxycodone for breakthrough pain  -scheduled oxycodone prior to am/afternoon therapies 4. Mood: team to provide ego support. He appears to be in good spirits.  5. Neuropsych: This patient is capable of making decisions on his own behalf. 6. Skin/Wound Care: local care to back incisions. Continue honeycomb dressing for now as wounds clean/dry 7. Fluids/Electrolytes/Nutrition: encourage PO. Check electrolytes upon admit  -poor appetite at present 8. Constipation/neurogenic bowel: augment bowel regimen given myelopathy/narcotics      Post Admission Physician Evaluation: 1. Functional deficits secondary  to thoracic  myelopathy/thoracic-lumbar stenosis . 2. Patient is admitted to receive collaborative, interdisciplinary care between the physiatrist, rehab nursing staff, and therapy team. 3. Patient's level of medical complexity and substantial therapy needs in context of that medical necessity cannot be provided at a lesser intensity of care such as a SNF. 4. Patient has experienced substantial functional loss from his/her baseline which was documented above under the "Functional History" and "Functional Status" headings.  Judging by the patient's diagnosis, physical exam, and functional history, the patient has potential for functional progress which will result in measurable gains while on inpatient rehab.  These gains will be of substantial and practical use upon discharge  in facilitating mobility and self-care at the household level. 5. Physiatrist will provide 24 hour management of medical needs as well as oversight of the therapy plan/treatment and provide guidance as appropriate regarding the interaction of the two. 6. 24 hour rehab nursing will assist with bladder management, bowel management, safety, skin/wound care, disease management, medication administration, pain management and patient education  and help integrate therapy concepts, techniques,education, etc. 7. PT will assess and treat for/with: Lower extremity strength, range of motion, stamina, balance, functional mobility, safety, adaptive techniques and equipment, pain mgt, back precautions.   Goals are: mod I to supervision. 8. OT will assess and treat for/with: ADL's, functional mobility, safety, upper extremity strength, adaptive techniques and equipment, brace don/doff, pain mgt, ego support, family education.   Goals are: mod I to min assist. Therapy may proceed with showering this patient. 9. SLP will assess and treat for/with: n/a.  Goals are: n/a. 10. Case Management and Social Worker will assess and treat for psychological issues and  discharge planning. 11. Team conference will be held weekly to assess progress toward goals and to determine barriers to discharge. 12. Patient will receive at least 3 hours of therapy per day at least 5 days per week. 13. ELOS: 8-12 days       14. Prognosis:  excellent     Meredith Staggers, MD, [facility] 01/28/2016  01/28/2016

## 2016-01-28 NOTE — Progress Notes (Signed)
Received pt. As a transfer.Pt. And wife were oriented to rehab routine and protocol.Safety plan was explained,fall prevention plan was explained and signed by pt.'s wife and RN.welcome video was played.

## 2016-01-28 NOTE — Progress Notes (Signed)
PT Cancellation Note  Patient Details Name: Russell Gonzales MRN: [id number] DOB: January 24, 1951   Cancelled Treatment:    Reason Eval/Treat Not Completed: Pain limiting ability to participate;Other (comment); reports 8/10 pain, RN aware.  She reports plans for d/c to CIR soon as she can call report.  Will defer further skilled PT to CIR.    Reginia Naas 01/28/2016, 2:21 PM  Magda Kiel, [place] 01/28/2016

## 2016-01-29 ENCOUNTER — Inpatient Hospital Stay (HOSPITAL_COMMUNITY): Payer: PPO | Admitting: Occupational Therapy

## 2016-01-29 ENCOUNTER — Inpatient Hospital Stay (HOSPITAL_COMMUNITY): Payer: PPO | Admitting: Physical Therapy

## 2016-01-29 DIAGNOSIS — M4804 Spinal stenosis, thoracic region: Secondary | ICD-10-CM

## 2016-01-29 DIAGNOSIS — I10 Essential (primary) hypertension: Secondary | ICD-10-CM

## 2016-01-29 DIAGNOSIS — G8220 Paraplegia, unspecified: Secondary | ICD-10-CM

## 2016-01-29 DIAGNOSIS — Z981 Arthrodesis status: Secondary | ICD-10-CM

## 2016-01-29 LAB — CBC WITH DIFFERENTIAL/PLATELET
BASOS ABS: 0 10*3/uL (ref 0.0–0.1)
BASOS PCT: 0 %
EOS ABS: 0.2 10*3/uL (ref 0.0–0.7)
EOS PCT: 3 %
HCT: 23.2 % — ABNORMAL LOW (ref 39.0–52.0)
Hemoglobin: 7.7 g/dL — ABNORMAL LOW (ref 13.0–17.0)
LYMPHS PCT: 24 %
Lymphs Abs: 1.4 10*3/uL (ref 0.7–4.0)
MCH: 31.4 pg (ref 26.0–34.0)
MCHC: 33.2 g/dL (ref 30.0–36.0)
MCV: 94.7 fL (ref 78.0–100.0)
MONO ABS: 0.9 10*3/uL (ref 0.1–1.0)
Monocytes Relative: 15 %
Neutro Abs: 3.5 10*3/uL (ref 1.7–7.7)
Neutrophils Relative %: 58 %
PLATELETS: 354 10*3/uL (ref 150–400)
RBC: 2.45 MIL/uL — AB (ref 4.22–5.81)
RDW: 12.6 % (ref 11.5–15.5)
WBC: 6 10*3/uL (ref 4.0–10.5)

## 2016-01-29 LAB — COMPREHENSIVE METABOLIC PANEL
ALBUMIN: 2.5 g/dL — AB (ref 3.5–5.0)
ALT: 39 U/L (ref 17–63)
AST: 18 U/L (ref 15–41)
Alkaline Phosphatase: 58 U/L (ref 38–126)
Anion gap: 8 (ref 5–15)
BUN: 17 mg/dL (ref 6–20)
CHLORIDE: 95 mmol/L — AB (ref 101–111)
CO2: 32 mmol/L (ref 22–32)
CREATININE: 2.11 mg/dL — AB (ref 0.61–1.24)
Calcium: 8.6 mg/dL — ABNORMAL LOW (ref 8.9–10.3)
GFR calc Af Amer: 36 mL/min — ABNORMAL LOW (ref >60)
GFR, EST NON AFRICAN AMERICAN: 31 mL/min — AB (ref >60)
Glucose, Bld: 103 mg/dL — ABNORMAL HIGH (ref 65–99)
POTASSIUM: 4.3 mmol/L (ref 3.5–5.1)
SODIUM: 135 mmol/L (ref 135–145)
Total Bilirubin: 0.6 mg/dL (ref 0.3–1.2)
Total Protein: 5.1 g/dL — ABNORMAL LOW (ref 6.5–8.1)

## 2016-01-29 LAB — GLUCOSE, CAPILLARY
GLUCOSE-CAPILLARY: 105 mg/dL — AB (ref 65–99)
GLUCOSE-CAPILLARY: 106 mg/dL — AB (ref 65–99)
Glucose-Capillary: 107 mg/dL — ABNORMAL HIGH (ref 65–99)
Glucose-Capillary: 123 mg/dL — ABNORMAL HIGH (ref 65–99)

## 2016-01-29 NOTE — Evaluation (Addendum)
Occupational Therapy Assessment and Plan  Patient Details  Name: Russell Gonzales MRN: [id number] Date of Birth: [birth year]  OT Diagnosis: abnormal posture, acute pain and muscle weakness (generalized) Rehab Potential: Rehab Potential (ACUTE ONLY): Excellent ELOS: 7-10 days   Today's Date: 01/29/2016 OT Individual Time: 3825-0539 and 0757-0900 OT Individual Time Calculation (min): 81 min and 63 minutes     Problem List: Patient Active Problem List   Diagnosis Date Noted  . Encounter for central line placement   . Acute respiratory failure (Russell Gonzales)   . Encounter for intubation   . S/P lumbar fusion 01/18/2016  . 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 Myelopathy (Russell Gonzales) 01/09/2016  . Back pain 01/09/2016  . Thoracic stenosis 08/11/2015  . 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 Other and unspecified hyperlipidemia 03/11/2013  . Essential hypertension 09/01/2009  . DIZZINESS 09/01/2009  . CHEST PAIN UNSPECIFIED 09/01/2009    Past Medical History:  Past Medical History:  Diagnosis Date  . Arthritis    "severe in my back" (08/11/2015)  . Chronic lower back pain   . Fatty liver   . GERD (gastroesophageal reflux disease)   . Hemorrhoids   . Hyperlipidemia   . Hypertension   . Low iron   . Prediabetes   . Seasonal allergies   . Shortness of breath dyspnea    With exertion   Past Surgical History:  Past Surgical History:  Procedure Laterality Date  . ANKLE FRACTURE SURGERY Left 1972   "it was crushed; has screws and plates"  . ANTERIOR CERVICAL DECOMP/DISCECTOMY FUSION  05/2008  . BACK SURGERY    . COLONOSCOPY W/ BIOPSIES AND POLYPECTOMY    . FRACTURE SURGERY    . KNEE ARTHROSCOPY Left 1970s   "removed cyst"  . LUMBAR LAMINECTOMY/DECOMPRESSION MICRODISCECTOMY N/A 08/11/2015   Procedure: Laminectomy - Thoracic eleven - Thoracic tweleve;  Surgeon: Russell Moore, MD;  Location: [place] [facility];  Service: Neurosurgery;  Laterality: N/A;  . POLYPECTOMY  2016  . POSTERIOR LUMBAR  FUSION  04/2008   "L1-S1; have rods and screws between 4 vertebrae"  . POSTERIOR LUMBAR FUSION 4 LEVEL N/A 01/18/2016   Procedure: Thoracic Eight-Lumbar Two Laminectomy with posterior decompression/segmental fixation;  Surgeon: Russell Miss, MD;  Location: [facility];  Service: Neurosurgery;  Laterality: N/A;  T8 to L2 Laminectomy with posterior decompression/segmental fixation from T8 to L2  . PROSTATE BIOPSY  2012  . 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 He was admitted on 01/18/16 for decompressive lam T 10- L1 with by Dr. Ellene Gonzales. Post op course complicated by ABLA--5 L blood loss as well as hypoxia and hypotension. PCCM consulted for management of shock, AKI and electrolyte abnormality. He was placed on empiric antibiotics due to elevated procalcitonin and tolerated extubated on 08/09. Was noted to have elevated INR- 1.73 with transient hematuria--DIC panel negative.  Patient currently requires Mod-Max A with basic self-care skills secondary to muscle weakness, decreased cardiorespiratoy endurance and difficulty maintaining precautions.  Prior to hospitalization, patient could complete BADLs with modified independence-complete independence.   Patient will benefit from skilled intervention to increase independence with basic self-care skills prior to discharge home with care  partner.  Anticipate patient will require intermittent supervision and follow up home health.  OT - End of Session Endurance Deficit: Yes Endurance Deficit Description: Pt required prolonged rest breaks during  session while engaged in ADL session  OT Assessment Rehab Potential (ACUTE ONLY): Excellent Barriers to Discharge: Decreased caregiver support OT Patient demonstrates impairments in the following area(s): Balance;Endurance;Pain;Safety;Motor OT Basic ADL's Functional Problem(s): Bathing;Dressing;Toileting OT Transfers Functional Problem(s): Toilet;Tub/Shower OT Plan OT Intensity: Minimum of 1-2 x/day, 45 to 90 minutes OT Frequency: 5 out of 7 days OT Duration/Estimated Length of Stay: 7-10 days OT Treatment/Interventions: Discharge planning;Pain management;Self Care/advanced ADL retraining;Therapeutic Activities;Functional mobility training;Therapeutic Exercise;Patient/family education;DME/adaptive equipment instruction;Neuromuscular re-education;UE/LE Strength taining/ROM;Wheelchair propulsion/positioning OT Self Feeding Anticipated Outcome(s): N/A OT Basic Self-Care Anticipated Outcome(s): Supervision  OT Toileting Anticipated Outcome(s): Supervision  OT Bathroom Transfers Anticipated Outcome(s): Supervision  OT Recommendation Patient destination: Home Follow Up Recommendations: Home health OT Equipment Recommended: 3 in 1 bedside comode;Tub/shower seat;To be determined   Skilled Therapeutic Intervention Pt was seen this morning for ADL retraining. Back brace donned with Total A while sitting at EOB. Pt recall of back precautions 2/3. Shower transfer completed from w/c to shower with Min A for balance, weakness and fatigue. Instruction required for proper hand placement during transfer. Bathing completed with assist for buttocks, and back while adhering to back precautions. UB/LB dressing completed while seated on tub bench with assist for socks. Pt able to cross legs over each other for threading LEs into pants. Pt returned to sink to complete grooming tasks/oral care w/c level with wife [name] present. C/o pain in left trunk with nursing notified at end of session. Focus of tx included  energy conservation, adherence to back precautions, and improving safety awareness. Pt left in w/c with wife present and all needs within reach at end of session.   2nd Session 1:1 Tx (81 minutes) Pt seen in afternoon session for ADL retraining with use of AE. Pt was provided sock aide, reacher, toilet aide, and LH sponge with instruction on use and pt demonstrating appropriate use in simulated ADL tasks. Pt completed simulated tub bench and toilet transfer with BSC over toilet. Pt reported at home backing up with walker and using walker for bilateral UE support. Pt educated on therapist ordering BSC at time of discharge to increase safety in home with verbalized understanding. Pt completed with Min A and wife was provided transfer training. Safety plan updated to include [facility]. Pt completed toileting x2 during session with nursing present to administer enema due to constipation. Pt and wife left with nursing at end of tx to complete toileting. Pt medicated during session. Continued c/o L trunk pain and requirement for recovery periods due to fatigue and general weakness. Mod vcs required for adherence to back precautions during functional activities.   OT Evaluation Precautions/Restrictions  Precautions Precautions: Fall;Back Required Braces or Orthoses: Spinal Brace Spinal Brace: Thoracolumbosacral orthotic;Applied in sitting position Restrictions Weight Bearing Restrictions: No General Chart Reviewed: Yes Family/Caregiver Present: Yes Vital Signs Therapy Vitals Temp: 98.4 F (36.9 C) Temp Source: Oral Pulse Rate: 85 Resp: 18 BP: (!) 102/52 Patient Position (if appropriate): Sitting Oxygen Therapy SpO2: (!) 85 % O2 Device: Not Delivered Pain Pain Assessment Pain Assessment: 0-10 Pain Score: 8  Pain Type: Acute pain Pain Location: Rib cage Pain Orientation: Left Pain Descriptors / Indicators: Aching Pain Frequency: Intermittent Pain Onset: On-going Pain Intervention(s):  Medication (See eMAR) Home Living/Prior Functioning Home Living Family/patient expects to be discharged to:: Private residence Living Arrangements: Spouse/significant  other Available Help at Discharge: Family, Available PRN/intermittently Type of Home: Mobile home Home Access: Stairs to enter Entrance Stairs-Number of Steps: concrete slab + 3 small steps + landing + threshold Entrance Stairs-Rails: Left Home Layout: One level Bathroom Shower/Tub: Optometrist: Yes  Lives With: Spouse IADL History Occupation: Retired Prior Function Level of Independence: Independent with basic ADLs, Independent with transfers, Independent with gait  Able to Take Stairs?: Yes Driving: Yes 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-yes (Comment) Comments: concessions, traveling (wife assisted wtih socks) ADL ADL ADL Comments: please see functional navigator for ADL status Vision/Perception  Vision- History Baseline Vision/History: No visual deficits Patient Visual Report: No change from baseline Vision- Assessment Vision Assessment?: No apparent visual deficits  Cognition Overall Cognitive Status: Within Functional Limits for tasks assessed Arousal/Alertness: Awake/alert Orientation Level: Person;Place;Situation Person: Oriented Place: Oriented Situation: Oriented Year: 2017 Month: August Day of Week: Correct Memory: Appears intact Immediate Memory Recall: Sock;Blue;Bed Memory Recall: Sock;Blue;Bed Memory Recall Sock: Without Cue Memory Recall Blue: Without Cue Memory Recall Bed: Without Cue Attention: Focused Focused Attention: Appears intact Safety/Judgment: Appears intact Sensation Sensation Light Touch: Appears Intact Stereognosis: Appears Intact Hot/Cold: Appears Intact Proprioception: Appears Intact Coordination Gross Motor Movements are Fluid and Coordinated: No Fine Motor Movements  are Fluid and Coordinated: Yes Motor  Motor Motor: Within Functional Limits Mobility  Transfers Sit to Stand: 4: Min guard;With upper extremity assist Stand to Sit: 5: Supervision;With upper extremity assist  Trunk/Postural Assessment  Cervical Assessment Cervical Assessment: Within Functional Limits Thoracic Assessment Thoracic Assessment: Exceptions to [facility] (due to back orthosis) Lumbar Assessment Lumbar Assessment: Exceptions to [facility] (due to back orthosis) Postural Control Postural Control: Deficits on evaluation  Balance Balance Balance Assessed: Yes Static Standing Balance Static Standing - Balance Support: Left upper extremity supported;During functional activity Static Standing - Level of Assistance: 5: Stand by assistance Dynamic Standing Balance Dynamic Standing - Balance Support: During functional activity;Left upper extremity supported Dynamic Standing - Level of Assistance: 4: Min assist Dynamic Standing - Balance Activities: Lateral lean/weight shifting;Forward lean/weight shifting Dynamic Standing - Comments: during BADLs Extremity/Trunk Assessment RUE Assessment RUE Assessment: Within Functional Limits LUE Assessment LUE Assessment: Within Functional Limits   See Function Navigator for Current Functional Status.   Refer to Care Plan for Long Term Goals  Recommendations for other services: None  Discharge Criteria: Patient will be discharged from OT if patient refuses treatment 3 consecutive times without medical reason, if treatment goals not met, if there is a change in medical status, if patient makes no progress towards goals or if patient is discharged from hospital.  The above assessment, treatment plan, treatment alternatives and goals were discussed and mutually agreed upon: by patient and by family  Skeet Simmer 01/29/2016, 4:17 PM

## 2016-01-29 NOTE — Progress Notes (Signed)
PM vitals signs showed o2 sat at 88%. On recheck, O2 sat fluctuating from 83-99. Patient states he feels a little "funny" but no trouble breathing. Hgb 7.7 today. MD notified, order given for PRN O2. Will continue to monitor.

## 2016-01-29 NOTE — Evaluation (Signed)
Physical Therapy Assessment and Plan  Patient Details  Name: Russell Gonzales MRN: [id number] Date of Birth: 21-Oct-1950  PT Diagnosis: Abnormality of gait, Difficulty walking, Muscle weakness and Pain in back and L ribcage Rehab Potential: Good ELOS: 7-10 days   Today's Date: 01/29/2016 PT Individual Time: 1005-1105 PT Individual Time Calculation (min): 60 min     Problem List: Patient Active Problem List   Diagnosis Date Noted  . Encounter for central line placement   . Acute respiratory failure ([place])   . Encounter for intubation   . S/P lumbar fusion 01/18/2016  . 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 Myelopathy ([place]) 01/09/2016  . Back pain 01/09/2016  . Thoracic stenosis 08/11/2015  . 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 Other and unspecified hyperlipidemia 03/11/2013  . Essential hypertension 09/01/2009  . DIZZINESS 09/01/2009  . CHEST PAIN UNSPECIFIED 09/01/2009    Past Medical History:  Past Medical History:  Diagnosis Date  . Arthritis    "severe in my back" (08/11/2015)  . Chronic lower back pain   . Fatty liver   . GERD (gastroesophageal reflux disease)   . Hemorrhoids   . Hyperlipidemia   . Hypertension   . Low iron   . Prediabetes   . Seasonal allergies   . Shortness of breath dyspnea    With exertion   Past Surgical History:  Past Surgical History:  Procedure Laterality Date  . ANKLE FRACTURE SURGERY Left 1972   "it was crushed; has screws and plates"  . ANTERIOR CERVICAL DECOMP/DISCECTOMY FUSION  05/2008  . BACK SURGERY    . COLONOSCOPY W/ BIOPSIES AND POLYPECTOMY    . FRACTURE SURGERY    . KNEE ARTHROSCOPY Left 1970s   "removed cyst"  . LUMBAR LAMINECTOMY/DECOMPRESSION MICRODISCECTOMY N/A 08/11/2015   Procedure: Laminectomy - Thoracic eleven - Thoracic tweleve;  Surgeon: Eustace Moore, MD;  Location: [facility];  Service: Neurosurgery;  Laterality: N/A;  . POLYPECTOMY  2016  . POSTERIOR LUMBAR FUSION  04/2008   "L1-S1; have rods and  screws between 4 vertebrae"  . POSTERIOR LUMBAR FUSION 4 LEVEL N/A 01/18/2016   Procedure: Thoracic Eight-Lumbar Two Laminectomy with posterior decompression/segmental fixation;  Surgeon: [name], MD;  Location: [place] [facility];  Service: Neurosurgery;  Laterality: N/A;  T8 to L2 Laminectomy with posterior decompression/segmental fixation from T8 to L2  . PROSTATE BIOPSY  2012  . 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 He was admitted on 01/18/16 for decompressive lam T 10- L1 with by Dr. Ellene Route. Post op course complicated by ABLA--5 L blood loss as well as hypoxia and hypotension. PCCM consulted for management of shock, AKI and electrolyte abnormality.  He was placed on empiric antibiotics due to elevated procalcitonin and tolerated extubated on 08/09.  Was noted to have elevated INR- 1.73 with transient hematuria--DIC panel negative.  As medical issues stabilized antibiotics discontinued and PCCM signed off.  Pain control has improved with improvement in mobilityPatient transferred to CIR on 01/28/2016.   Patient currently requires min with mobility secondary to muscle weakness, decreased cardiorespiratoy endurance and decreased standing balance, decreased balance strategies and difficulty maintaining precautions.  Prior  to hospitalization, patient was independent  with mobility and lived with Spouse in a Mobile home home.  Home access is concrete slab + 3 small steps + landing + thresholdStairs to enter.  Patient will benefit from skilled PT intervention to maximize safe functional  mobility, minimize fall risk and decrease caregiver burden for planned discharge home with intermittent assist.  Anticipate patient will benefit from follow up [facility] at discharge.  PT - End of Session Activity Tolerance: Tolerates 30+ min activity with multiple rests;Improving Endurance Deficit: Yes Endurance Deficit Description: patient fatigued easily and required seated rest breaks with mobility PT Assessment Rehab Potential (ACUTE/IP ONLY): Good Barriers to Discharge: Decreased caregiver support;Inaccessible home environment PT Patient demonstrates impairments in the following area(s): Balance;Edema;Endurance;Motor;Nutrition;Pain;Sensory PT Transfers Functional Problem(s): Bed Mobility;Bed to Chair;Car;Furniture PT Locomotion Functional Problem(s): Ambulation;Wheelchair Mobility;Stairs PT Plan PT Intensity: Minimum of 1-2 x/day ,45 to 90 minutes PT Frequency: 5 out of 7 days PT Duration Estimated Length of Stay: 7-10 days PT Treatment/Interventions: Ambulation/gait training;Balance/vestibular training;Community reintegration;Discharge planning;Disease management/prevention;DME/adaptive equipment instruction;Functional mobility training;Neuromuscular re-education;Pain management;Patient/family education;Psychosocial support;Stair training;Therapeutic Activities;Therapeutic Exercise;UE/LE Strength taining/ROM;UE/LE Coordination activities;Wheelchair propulsion/positioning PT Transfers Anticipated Outcome(s): mod I PT Locomotion Anticipated Outcome(s): mod I household PT Recommendation Follow Up Recommendations: Home health PT Patient destination: Home Equipment Recommended: Rolling walker with 5" wheels Equipment Details: RW, shower stool  Skilled Therapeutic Intervention Skilled therapeutic intervention initiated after completion of evaluation. Discussed with patient and wife falls risk, safety within room, and focus of therapy during stay, possible length of stay, goals, and follow-up  therapy. Patient fatigued following OT evaluation and reporting difficulty sleeping in hospital. TLSO noted to be loose therefore brace adjusted. Patient requires close supervision-min A overall using RW. Patient's wife works from 7a-7p therefore education provided regarding bathing at night and dressing and donning brace in morning with wife present, patient and wife verbalized agreement. Patient reports having big truck, discussed need for actual car transfer prior to DC. Patient limited by fatigue and increased L ribcage pain. Patient left sitting in wheelchair with needs in reach and wife present.   PT Evaluation Precautions/Restrictions Precautions Precautions: Fall;Back Spinal Brace: Thoracolumbosacral orthotic;Applied in sitting position Restrictions Weight Bearing Restrictions: No General Chart Reviewed: Yes Family/Caregiver Present: Yes  Pain Pain Assessment Pain Assessment: 0-10 Pain Score: 5  Pain Type: Acute pain Pain Location: Rib cage Pain Orientation: Left Pain Descriptors / Indicators: Aching;Burning;Sharp Pain Frequency: Intermittent Pain Onset: On-going Pain Intervention(s): Medication (See eMAR) Home Living/Prior Functioning Home Living Available Help at Discharge: Family;Available PRN/intermittently Type of Home: Mobile home Home Access: Stairs to enter Entrance Stairs-Number of Steps: concrete slab + 3 small steps + landing + threshold Entrance Stairs-Rails: Left (wall on the right) Home Layout: One level Bathroom Shower/Tub: Chiropodist: Standard (taller than standard but no handicapped) Bathroom Accessibility: Yes  Lives With: Spouse Prior Function Level of Independence: Independent with gait;Independent with basic ADLs;Independent with transfers  Able to Take Stairs?: Yes Driving: Yes 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-yes (Comment) Comments: concessions, traveling (wife assisted wtih  socks) Vision/Perception   No change from baseline  Cognition Orientation Level: Oriented X4 Sensation Sensation Light Touch: Appears Intact Stereognosis: Appears Intact Hot/Cold: Appears Intact Proprioception: Appears Intact Coordination Gross Motor Movements are Fluid and Coordinated: Yes Fine Motor Movements are Fluid and Coordinated: Yes Motor  Motor Motor: Within Functional Limits  Mobility Transfers Transfers: Yes Sit to Stand: 4: Min guard;With upper extremity assist Stand to Sit: 5: Supervision;With upper extremity assist Locomotion  Ambulation  Ambulation: Yes Ambulation/Gait Assistance: 4: Min guard Ambulation Distance (Feet): 20 Feet Assistive device: Rolling walker Gait Gait: Yes Gait Pattern: Impaired Gait Pattern: Step-through pattern;Decreased stance time - left;Decreased stride length;Trunk flexed Gait velocity: decreased Stairs / Additional Locomotion Stairs: Yes Stairs Assistance: 5: Supervision Stair Management Technique: Alternating pattern;Two rails;Forwards Number of Stairs: 8 Height of Stairs: 3 Architect: Yes Wheelchair Assistance: 5: Careers information officer: Both upper extremities Wheelchair Parts Management: Needs assistance (for leg rests due to back precautions) Distance: 150 ft  Trunk/Postural Assessment  Cervical Assessment Cervical Assessment: Within Functional Limits Thoracic Assessment Thoracic Assessment: Exceptions to [facility] (back precautions/TLSO) Lumbar Assessment Lumbar Assessment: Exceptions to WFL (back precautions/TLSO) Postural Control Postural Control: Deficits on evaluation Protective Responses: delayed/insufficient  Balance Balance Balance Assessed: Yes Static Standing Balance Static Standing - Balance Support: During functional activity;Bilateral upper extremity supported Static Standing - Level of Assistance: 5: Stand by assistance Dynamic Standing Balance Dynamic Standing  - Balance Support: During functional activity;No upper extremity supported Dynamic Standing - Level of Assistance: 4: Min assist Extremity Assessment  RUE Assessment RUE Assessment: Within Functional Limits LUE Assessment LUE Assessment: Within Functional Limits RLE Assessment RLE Assessment: Within Functional Limits (grossly 4 to 5/5 ) LLE Assessment LLE Assessment: Within Functional Limits (grossly 4 to 5/5)   See Function Navigator for Current Functional Status.   Refer to Care Plan for Long Term Goals  Recommendations for other services: None  Discharge Criteria: Patient will be discharged from PT if patient refuses treatment 3 consecutive times without medical reason, if treatment goals not met, if there is a change in medical status, if patient makes no progress towards goals or if patient is discharged from hospital.  The above assessment, treatment plan, treatment alternatives and goals were discussed and mutually agreed upon: by patient  Laretta Alstrom 01/29/2016, 10:54 AM

## 2016-01-29 NOTE — Progress Notes (Signed)
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 He was admitted on 01/18/16 for decompressive lam T 10- L1 with by Dr. Ellene Route. Post op course complicated by ABLA--5 L blood loss as well as hypoxia and hypotension. PCCM consulted for management of shock, AKI and electrolyte abnormality.  He was placed on empiric antibiotics due to elevated procalcitonin and tolerated extubated on 08/09.  Was noted to have elevated INR- 1.73 with transient hematuria--DIC panel negative.   Subjective/Complaints:   Objective: Vital Signs: Blood pressure 123/75, pulse 82, temperature 98 F (36.7 C), temperature source Oral, resp. rate 16, height _0  (1.676 m), weight 110.2 kg (242 lb 15.5 oz), SpO2 97 %. No results found. Results for orders placed or performed during the hospital encounter of 01/28/16 (from the past 72 hour(s))  Glucose, capillary     Status: None   Collection Time: 01/28/16  4:30 PM  Result Value Ref Range   Glucose-Capillary 98 65 - 99 mg/dL  Glucose, capillary     Status: None   Collection Time: 01/28/16  9:10 PM  Result Value Ref Range   Glucose-Capillary 98 65 - 99 mg/dL  CBC WITH DIFFERENTIAL     Status: Abnormal   Collection Time: 01/29/16  5:04 AM  Result Value Ref Range   WBC 6.0 4.0 - 10.5 K/uL   RBC 2.45 (L) 4.22 - 5.81 MIL/uL   Hemoglobin 7.7 (L) 13.0 - 17.0 g/dL   HCT 23.2 (L) 39.0 - 52.0 %   MCV 94.7 78.0 - 100.0 fL   MCH 31.4 26.0 - 34.0 pg   MCHC 33.2 30.0 - 36.0 g/dL   RDW 12.6 11.5 - 15.5 %   Platelets 354 150 - 400 K/uL   Neutrophils Relative % 58 %   Neutro Abs 3.5 1.7 - 7.7 K/uL   Lymphocytes Relative 24 %   Lymphs Abs 1.4 0.7 - 4.0 K/uL   Monocytes Relative 15 %   Monocytes Absolute 0.9 0.1 - 1.0 K/uL   Eosinophils  Relative 3 %   Eosinophils Absolute 0.2 0.0 - 0.7 K/uL   Basophils Relative 0 %   Basophils Absolute 0.0 0.0 - 0.1 K/uL  Comprehensive metabolic panel     Status: Abnormal   Collection Time: 01/29/16  5:04 AM  Result Value Ref Range   Sodium 135 135 - 145 mmol/L   Potassium 4.3 3.5 - 5.1 mmol/L   Chloride 95 (L) 101 - 111 mmol/L   CO2 32 22 - 32 mmol/L   Glucose, Bld 103 (H) 65 - 99 mg/dL   BUN 17 6 - 20 mg/dL   Creatinine, Ser 2.11 (H) 0.61 - 1.24 mg/dL   Calcium 8.6 (L) 8.9 - 10.3 mg/dL   Total Protein 5.1 (L) 6.5 - 8.1 g/dL   Albumin 2.5 (L) 3.5 - 5.0 g/dL   AST 18 15 - 41 U/L   ALT 39 17 - 63 U/L   Alkaline Phosphatase 58 38 - 126 U/L   Total Bilirubin 0.6 0.3 - 1.2 mg/dL   GFR calc non Af Amer 31 (L) >60 mL/min   GFR calc Af Amer 36 (L) >60 mL/min    Comment: (NOTE) The eGFR has been calculated using the CKD EPI equation. This calculation has not been validated in all clinical situations. eGFR's persistently <60 mL/min  signify possible Chronic Kidney Disease.    Anion gap 8 5 - 15  Glucose, capillary     Status: Abnormal   Collection Time: 01/29/16  6:38 AM  Result Value Ref Range   Glucose-Capillary 105 (H) 65 - 99 mg/dL     HEENT: normal Cardio: RRR and no murmur Resp: CTA B/L and unlabored GI: BS positive and mild distension Extremity:  Pulses positive and No Edema Skin:   Wound C/D/I and thoracic incision Neuro: Alert/Oriented and Abnormal Motor 5/5 in BUE, 3- Right HF, KE ADF, 4/5 Left HF, KE, ADF Musc/Skel:  LB tender and Other pain with rolling thoracic spine area Gen NAD   Assessment/Plan: 1. Functional deficits secondary to Thoracic myelopathy with paraparesis which require 3+ hours per day of interdisciplinary therapy in a comprehensive inpatient rehab setting. Physiatrist is providing close team supervision and 24 hour management of active medical problems listed below. Physiatrist and rehab team continue to assess barriers to discharge/monitor  patient progress toward functional and medical goals. FIM:                                  Medical Problem List and Plan: 1.  Functional and mobility deficits  secondary to thoracic myelopathy s/pt T10-L1 decompression - start CIR PT, OT SLP 2.  DVT Prophylaxis/Anticoagulation: Pharmaceutical: Lovenox 83m daily 3. Pain Management: decrease fentanyl patch to 5109m and gradually further as tolerated.                        -continue oxycodone for breakthrough pain                       -scheduled oxycodone prior to am/afternoon therapies 4. Mood: team to provide ego support. He appears to be in good spirits.  5. Neuropsych: This patient is capable of making decisions on his own behalf. 6. Skin/Wound Care: local care to back incisions. Continue honeycomb dressing for now as wounds clean/dry 7. Fluids/Electrolytes/Nutrition: encourage PO. Check electrolytes upon admit                       -poor appetite at present 8. Constipation/neurogenic bowel: augment bowel regimen given myelopathy/narcotics   LOS (Days) 1 A FACE TO FACE EVALUATION WAS PERFORMED  Mehdi Gironda E 01/29/2016, 7:06 AM

## 2016-01-30 ENCOUNTER — Inpatient Hospital Stay (HOSPITAL_COMMUNITY): Payer: PPO | Admitting: Occupational Therapy

## 2016-01-30 LAB — GLUCOSE, CAPILLARY
GLUCOSE-CAPILLARY: 101 mg/dL — AB (ref 65–99)
Glucose-Capillary: 96 mg/dL (ref 65–99)
Glucose-Capillary: 93 mg/dL (ref 65–99)
Glucose-Capillary: 107 mg/dL — ABNORMAL HIGH (ref 65–99)

## 2016-01-30 MED ORDER — TRAMADOL HCL 50 MG PO TABS
50.0000 mg | ORAL_TABLET | Freq: Four times a day (QID) | ORAL | Status: DC
  Administered 2016-01-30 – 2016-02-04 (×22): 50 mg via ORAL
  Filled 2016-01-30 (×22): qty 1

## 2016-01-30 NOTE — Progress Notes (Signed)
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 He was admitted on 01/18/16 for decompressive lam T 10- L1 with by Dr. Ellene Route. Post op course complicated by ABLA--5 L blood loss as well as hypoxia and hypotension. PCCM consulted for management of shock, AKI and electrolyte abnormality.  He was placed on empiric antibiotics due to elevated procalcitonin and tolerated extubated on 08/09.  Was noted to have elevated INR- 1.73 with transient hematuria--DIC panel negative.   Subjective/Complaints: Occ left rib pain when pain meds wear off Had fluctuating O2 sats yest but no SOB Vitals reviewed this am, normal  ROS- denies CP, SOB, N/V/D  Objective: Vital Signs: Blood pressure 123/79, pulse 85, temperature 98.1 F (36.7 C), temperature source Oral, resp. rate 18, height 5' 6" (1.676 m), weight 110.2 kg (242 lb 15.5 oz), SpO2 99 %. No results found. Results for orders placed or performed during the hospital encounter of 01/28/16 (from the past 72 hour(s))  Glucose, capillary     Status: None   Collection Time: 01/28/16  4:30 PM  Result Value Ref Range   Glucose-Capillary 98 65 - 99 mg/dL  Glucose, capillary     Status: None   Collection Time: 01/28/16  9:10 PM  Result Value Ref Range   Glucose-Capillary 98 65 - 99 mg/dL  CBC WITH DIFFERENTIAL     Status: Abnormal   Collection Time: 01/29/16  5:04 AM  Result Value Ref Range   WBC 6.0 4.0 - 10.5 K/uL   RBC 2.45 (L) 4.22 - 5.81 MIL/uL   Hemoglobin 7.7 (L) 13.0 - 17.0 g/dL   HCT 23.2 (L) 39.0 - 52.0 %   MCV 94.7 78.0 - 100.0 fL   MCH 31.4 26.0 - 34.0 pg   MCHC 33.2 30.0 - 36.0 g/dL   RDW 12.6 11.5 - 15.5 %   Platelets 354 150 - 400 K/uL   Neutrophils Relative % 58 %   Neutro Abs 3.5 1.7 - 7.7 K/uL    Lymphocytes Relative 24 %   Lymphs Abs 1.4 0.7 - 4.0 K/uL   Monocytes Relative 15 %   Monocytes Absolute 0.9 0.1 - 1.0 K/uL   Eosinophils Relative 3 %   Eosinophils Absolute 0.2 0.0 - 0.7 K/uL   Basophils Relative 0 %   Basophils Absolute 0.0 0.0 - 0.1 K/uL  Comprehensive metabolic panel     Status: Abnormal   Collection Time: 01/29/16  5:04 AM  Result Value Ref Range   Sodium 135 135 - 145 mmol/L   Potassium 4.3 3.5 - 5.1 mmol/L   Chloride 95 (L) 101 - 111 mmol/L   CO2 32 22 - 32 mmol/L   Glucose, Bld 103 (H) 65 - 99 mg/dL   BUN 17 6 - 20 mg/dL   Creatinine, Ser 2.11 (H) 0.61 - 1.24 mg/dL   Calcium 8.6 (L) 8.9 - 10.3 mg/dL   Total Protein 5.1 (L) 6.5 - 8.1 g/dL   Albumin 2.5 (L) 3.5 - 5.0 g/dL   AST 18 15 - 41 U/L   ALT 39 17 - 63 U/L   Alkaline Phosphatase 58 38 - 126 U/L   Total Bilirubin 0.6 0.3 - 1.2 mg/dL   GFR calc non Af Amer 31 (L) >60 mL/min   GFR calc Af Amer 36 (L) >60 mL/min  Comment: (NOTE) The eGFR has been calculated using the CKD EPI equation. This calculation has not been validated in all clinical situations. eGFR's persistently <60 mL/min signify possible Chronic Kidney Disease.    Anion gap 8 5 - 15  Glucose, capillary     Status: Abnormal   Collection Time: 01/29/16  6:38 AM  Result Value Ref Range   Glucose-Capillary 105 (H) 65 - 99 mg/dL  Glucose, capillary     Status: Abnormal   Collection Time: 01/29/16 11:51 AM  Result Value Ref Range   Glucose-Capillary 107 (H) 65 - 99 mg/dL  Glucose, capillary     Status: Abnormal   Collection Time: 01/29/16  4:40 PM  Result Value Ref Range   Glucose-Capillary 123 (H) 65 - 99 mg/dL  Glucose, capillary     Status: Abnormal   Collection Time: 01/29/16  8:41 PM  Result Value Ref Range   Glucose-Capillary 106 (H) 65 - 99 mg/dL  Glucose, capillary     Status: None   Collection Time: 01/30/16  5:47 AM  Result Value Ref Range   Glucose-Capillary 96 65 - 99 mg/dL     HEENT: normal Cardio: RRR and no  murmur Resp: CTA B/L and unlabored GI: BS positive and mild distension Extremity:  Pulses positive and No Edema Skin:   Wound C/D/I and thoracic incision Neuro: Alert/Oriented and Abnormal Motor 5/5 in BUE, 3- Right HF, KE ADF, 4/5 Left HF, KE, ADF Musc/Skel:  No tenderness to palpation along the thoracic spine Gen NAD   Assessment/Plan: 1. Functional deficits secondary to Thoracic myelopathy with paraparesis which require 3+ hours per day of interdisciplinary therapy in a comprehensive inpatient rehab setting. Physiatrist is providing close team supervision and 24 hour management of active medical problems listed below. Physiatrist and rehab team continue to assess barriers to discharge/monitor patient progress toward functional and medical goals. FIM: Function - Bathing Position: Shower Body parts bathed by patient: Right arm, Left arm, Chest, Abdomen, Front perineal area, Right upper leg, Left upper leg, Right lower leg, Left lower leg Body parts bathed by helper: Buttocks, Back Assist Level: Touching or steadying assistance(Pt > 75%)  Function- Upper Body Dressing/Undressing What is the patient wearing?: Pull over shirt/dress Pull over shirt/dress - Perfomed by patient: Thread/unthread right sleeve, Thread/unthread left sleeve, Put head through opening Pull over shirt/dress - Perfomed by helper: Pull shirt over trunk Assist Level: Touching or steadying assistance(Pt > 75%) Function - Lower Body Dressing/Undressing What is the patient wearing?: Pants, Non-skid slipper socks Position: Other (comment) (sitting on tub bench) Pants- Performed by patient: Thread/unthread right pants leg, Thread/unthread left pants leg, Pull pants up/down Non-skid slipper socks- Performed by helper: Don/doff right sock, Don/doff left sock Assist for footwear: Maximal assist Assist for lower body dressing: Touching or steadying assistance (Pt > 75%)     Function - Toilet Transfers Toilet transfer  assistive device: Elevated toilet seat/BSC over toilet, Walker Assist level to toilet: Touching or steadying assistance (Pt > 75%) Assist level from toilet: Touching or steadying assistance (Pt > 75%)  Function - Chair/bed transfer Chair/bed transfer method: Stand pivot Chair/bed transfer assist level: Touching or steadying assistance (Pt > 75%) Chair/bed transfer assistive device: Armrests  Function - Locomotion: Wheelchair Will patient use wheelchair at discharge?: No Type: Manual Max wheelchair distance: 150 ft Assist Level: Supervision or verbal cues Assist Level: Supervision or verbal cues Assist Level: Supervision or verbal cues Function - Locomotion: Ambulation Assistive device: Walker-rolling Max distance: 20 Assist level: Touching or  steadying assistance (Pt > 75%) Assist level: Touching or steadying assistance (Pt > 75%) Walk 50 feet with 2 turns activity did not occur: Safety/medical concerns (fatigue) Walk 150 feet activity did not occur: Safety/medical concerns (fatigue) Walk 10 feet on uneven surfaces activity did not occur: Safety/medical concerns (fatigue)  Function - Comprehension Comprehension: Auditory Comprehension assist level: Follows complex conversation/direction with no assist  Function - Expression Expression: Verbal Expression assist level: Expresses complex ideas: With no assist  Function - Social Interaction Social Interaction assist level: Interacts appropriately with others - No medications needed.  Function - Problem Solving Problem solving assist level: Solves basic 90% of the time/requires cueing < 10% of the time  Function - Memory Memory assist level: Complete Independence: No helper Patient normally able to recall (first 3 days only): Current season, Location of own room, Staff names and faces, That he or she is in a hospital  Medical Problem List and Plan: 1.  Functional and mobility deficits  secondary to thoracic myelopathy s/pt  T10-L1 decompression - start CIR PT, OT SLP 2.  DVT Prophylaxis/Anticoagulation: Pharmaceutical: Lovenox 59m daily 3. Pain Management: decrease fentanyl patch to 524m and gradually further as tolerated.                        -continue oxycodone for breakthrough pain                       -scheduled oxycodone prior to am/afternoon therapies 4. Mood: team to provide ego support. no depression on anxiety noted  5. Neuropsych: This patient is capable of making decisions on his own behalf. 6. Skin/Wound Care: local care to back incisions.has dermabond, no other dressing needed     7. Fluids/Electrolytes/Nutrition: encourage PO. Check electrolytes upon admit                       -poor appetite at present 8. Constipation/neurogenic bowel: augment bowel regimen given myelopathy/narcotics   LOS (Days) 2 A FACE TO FACE EVALUATION WAS PERFORMED  KIRSTEINS,ANDREW E 01/30/2016, 7:17 AM

## 2016-01-30 NOTE — Progress Notes (Signed)
Occupational Therapy Session Note  Patient Details  Name: Russell Gonzales MRN: [id number] Date of Birth: 1950-06-22  Today's Date: 01/30/2016 OT Individual Time: 1107-1210 OT Individual Time Calculation (min): 63 min     Short Term Goals: Week 1:  OT Short Term Goal 1 (Week 1): Pt will complete LB dressing with Min A with use of AE OT Short Term Goal 2 (Week 1): Pt will don back orthosis with Mod A OT Short Term Goal 3 (Week 1): Pt will complete toileting with Min A OT Short Term Goal 4 (Week 1): Pt will recall 3/3 back precautions during ADL session   Skilled Therapeutic Interventions/Progress Updates:   Pt participated in skilled OT tx focusing on improving activity tolerance, standing endurance, and safety awareness. Pt was lying in bed with visitors upon skilled OT arrival. Pt was agreeable to shower today. Pt was instructed on log roll technique for supine to sit and back orthosis was donned over shirt with Max A. Pt ambulated with RW and completed tub bench transfer with Min A. UB/LB bathing completed with use of LH sponge with pt laterally leaning for pericare with steadying assistance. Pt adhered to back precautions during shower. 2/3 recall of precautions today. UB/LB dressing completed on tub bench with pt demonstrating carryover with use of sock aide for donning socks. Due to LE edema, pts legs were wrapped with ACE bandages. Nursing in agreement regarding decision to apply ACE wraps. Pt completed shaving w/c level at sink with extra time and setup. DME needs discussed with wife with agreement to order tub bench and BSC to optimize safety with ADLs at home. Pt left in w/c at end of session with all needs within reach and family members present.  Plans made to contact ortho doctor tomorrow for refitting pt for orthosis. At this time, orthosis tends to ride up and pinch underarms when walking.   Therapy Documentation Precautions:  Precautions Precautions: Fall, Back Required Braces or  Orthoses: Spinal Brace Spinal Brace: Thoracolumbosacral orthotic, Applied in sitting position Restrictions Weight Bearing Restrictions: No General:   Vital Signs: Therapy Vitals Temp: 98.3 F (36.8 C) Temp Source: Oral Pulse Rate: 89 Resp: 18 BP: 129/69 Patient Position (if appropriate): Sitting Oxygen Therapy SpO2: 93 % O2 Device: Not Delivered Pain: Pt reported pain to be manageable with provided recovery periods. Pt reports pain to be chronic problem  Pain Assessment Pain Score: 2  (pain in L side off and on, flares up despite pain meds) ADL: ADL ADL Comments: please see functional navigator for ADL status    See Function Navigator for Current Functional Status.   Therapy/Group: Individual Therapy  Jessilyn Catino A Damarea Merkel 01/30/2016, 4:42 PM

## 2016-01-30 NOTE — Progress Notes (Signed)
Patient noticed today area about 3" around incision site numb. Can feel pressure when touched directly on spine. Will continue to monitor

## 2016-01-30 NOTE — Plan of Care (Signed)
Problem: RH PAIN MANAGEMENT Goal: RH STG PAIN MANAGED AT OR BELOW PT'S PAIN GOAL Less than 4 Outcome: Not Progressing L side pain below rib cage ongoing problem. Unable to sleep because of pain. Pt suggested tramadol, trying today.

## 2016-01-31 ENCOUNTER — Inpatient Hospital Stay (HOSPITAL_COMMUNITY): Payer: PPO | Admitting: Occupational Therapy

## 2016-01-31 ENCOUNTER — Inpatient Hospital Stay (HOSPITAL_COMMUNITY): Payer: PPO | Admitting: Physical Therapy

## 2016-01-31 DIAGNOSIS — G8918 Other acute postprocedural pain: Secondary | ICD-10-CM | POA: Insufficient documentation

## 2016-01-31 DIAGNOSIS — N179 Acute kidney failure, unspecified: Secondary | ICD-10-CM

## 2016-01-31 DIAGNOSIS — D62 Acute posthemorrhagic anemia: Secondary | ICD-10-CM

## 2016-01-31 LAB — GLUCOSE, CAPILLARY
GLUCOSE-CAPILLARY: 86 mg/dL (ref 65–99)
GLUCOSE-CAPILLARY: 98 mg/dL (ref 65–99)
Glucose-Capillary: 112 mg/dL — ABNORMAL HIGH (ref 65–99)
Glucose-Capillary: 104 mg/dL — ABNORMAL HIGH (ref 65–99)

## 2016-01-31 LAB — CBC
HCT: 22.8 % — ABNORMAL LOW (ref 39.0–52.0)
Hemoglobin: 7.7 g/dL — ABNORMAL LOW (ref 13.0–17.0)
MCH: 31.7 pg (ref 26.0–34.0)
MCHC: 33.8 g/dL (ref 30.0–36.0)
MCV: 93.8 fL (ref 78.0–100.0)
PLATELETS: 372 10*3/uL (ref 150–400)
RBC: 2.43 MIL/uL — ABNORMAL LOW (ref 4.22–5.81)
RDW: 12.5 % (ref 11.5–15.5)
WBC: 4.5 10*3/uL (ref 4.0–10.5)

## 2016-01-31 NOTE — Progress Notes (Signed)
Occupational Therapy Session Note  Patient Details  Name: Russell Gonzales MRN: [id number] Date of Birth: September 11, 1950  Today's Date: 01/31/2016 OT Individual Time: [id number] OT Individual Time Calculation (min): 55 min     Short Term Goals: Week 1:  OT Short Term Goal 1 (Week 1): Pt will complete LB dressing with Min A with use of AE OT Short Term Goal 2 (Week 1): Spouse [name] will don pts back orthosis with supervision  OT Short Term Goal 3 (Week 1): Pt will complete toileting with Min A OT Short Term Goal 4 (Week 1): Pt will recall 3/3 back precautions during ADL session   Skilled Therapeutic Interventions/Progress Updates:    Upon entering the room, pt seated in wheelchair awaiting therapist with 5/10 c/o pain in ribs this session. OT notified RN and medication given this session. Pt propelled wheelchair to ADL apartment with supervision and increased time. Pt verbalized 3/3 back precautions with 100% accuracy. Pt ambulated short distance of 15' in ADL apartment with use of RW and close supervision. Pt performed sit <>supine on standard bed, resembles home environment, with min cues and log roll technique. OT educated and demonstrated transfer onto tub bench with use of RW. Pt returned demonstration with steady assistance for safety. Pt needing min cues to maintain precautions with transfer. Pt returning back to room in same manner as above with call bell and all needed items within reach upon exiting the room.   Therapy Documentation Precautions:  Precautions Precautions: Fall, Back Required Braces or Orthoses: Spinal Brace Spinal Brace: Thoracolumbosacral orthotic, Applied in sitting position Restrictions Weight Bearing Restrictions: No General:   Vital Signs:   Pain: Pain Assessment Pain Assessment: 0-10 Pain Score: 5  Pain Type: Chronic pain Pain Location: Rib cage Pain Orientation: Left Pain Descriptors / Indicators: Aching;Burning Pain Frequency: Intermittent Pain  Onset: On-going Pain Intervention(s): Medication (See eMAR) ADL: ADL ADL Comments: please see functional navigator for ADL status Exercises:   Other Treatments:    See Function Navigator for Current Functional Status.   Therapy/Group: Individual Therapy  [name] 01/31/2016, 6:31 PM

## 2016-01-31 NOTE — IPOC Note (Signed)
Overall Plan of Care [facility]) Patient Details Name: Russell Gonzales MRN: [id number] DOB: [birth year]  Admitting Diagnosis: Thoracic Myelopathy  Hospital Problems: Principal Problem:   Thoracic myelopathy Active Problems:   Essential hypertension   Thoracic stenosis   S/P lumbar fusion   AKI (acute kidney injury) ([place])   Acute blood loss anemia   Post-operative pain   Morbid obesity due to excess calories ([place])     Functional Problem List: Nursing Bowel, Endurance, Motor, Pain, Safety, Skin Integrity  PT Balance, Edema, Endurance, Motor, Nutrition, Pain, Sensory  OT Balance, Endurance, Pain, Safety, Motor  SLP    TR         Basic ADL's: OT Bathing, Dressing, Toileting     Advanced  ADL's: OT       Transfers: PT Bed Mobility, Bed to Chair, Car, Manufacturing systems engineer, Metallurgist: PT Ambulation, Emergency planning/management officer, Stairs     Additional Impairments: OT    SLP        TR      Anticipated Outcomes Item Anticipated Outcome  Self Feeding N/A  Swallowing      Basic self-care  Supervision   Toileting  Supervision    Bathroom Transfers Supervision   Bowel/Bladder  Continent to bowel and bladder with mod. assist.  Transfers  mod I  Locomotion  mod I household  Communication     Cognition     Pain  Less than 3, on 1 to 10 scale.  Safety/Judgment  Free from falls during his stay in rehab.   Therapy Plan: PT Intensity: Minimum of 1-2 x/day ,45 to 90 minutes PT Frequency: 5 out of 7 days PT Duration Estimated Length of Stay: 7-10 days OT Intensity: Minimum of 1-2 x/day, 45 to 90 minutes OT Frequency: 5 out of 7 days OT Duration/Estimated Length of Stay: 7-10 days         Team Interventions: Nursing Interventions Patient/Family Education, Bowel Management, Pain Management, Skin Care/Wound Management, Discharge Planning  PT interventions Ambulation/gait training, Balance/vestibular training, Community reintegration, Discharge planning,  Disease management/prevention, DME/adaptive equipment instruction, Functional mobility training, Neuromuscular re-education, Pain management, Patient/family education, Psychosocial support, Stair training, Therapeutic Activities, Therapeutic Exercise, UE/LE Strength taining/ROM, UE/LE Coordination activities, Wheelchair propulsion/positioning  OT Interventions Discharge planning, Pain management, Self Care/advanced ADL retraining, Therapeutic Activities, Functional mobility training, Therapeutic Exercise, Patient/family education, DME/adaptive equipment instruction, Neuromuscular re-education, UE/LE Strength taining/ROM, Wheelchair propulsion/positioning  SLP Interventions    TR Interventions    SW/CM Interventions      Team Discharge Planning: Destination: PT-Home ,OT- Home , SLP-  Projected Follow-up: PT-Home health PT, OT-  Home health OT, SLP-  Projected Equipment Needs: PT-Rolling walker with 5" wheels, OT- 3 in 1 bedside comode, Tub/shower seat, To be determined, SLP-  Equipment Details: PT-RW, shower stool, OT-  Patient/family involved in discharge planning: PT- Patient, Family member/caregiver,  OT-Patient, Family member/caregiver, SLP-   MD ELOS: 5-8 days. 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 He was admitted on 01/18/16 for decompressive lam T 10- L1 with by Dr. Ellene Route. Post op course complicated by ABLA--5 L blood loss as well as hypoxia and hypotension. PCCM consulted for management of shock, AKI and electrolyte abnormality.  He was placed on empiric antibiotics due to elevated procalcitonin and tolerated  extubated on 08/09.  Was noted to have elevated INR with transient hematuria--DIC panel negative.  As medical  issues stabilized antibiotics discontinued and PCCM signed off.  Pain control has improved with improvement in mobility. Pt with residual functional deficits with mobility, standing balance, and endurance.  Will let goals for Supervision/Mod I with therapies.     See Team Conference Notes for weekly updates to the plan of care

## 2016-01-31 NOTE — Progress Notes (Signed)
ANTICOAGULATION CONSULT NOTE - Initial Consult  Pharmacy Consult for Lovenox Indication: VTE prophylaxis  Allergies  Allergen Reactions  . Aspirin Hives  . Bc Fast Pain [Aspirin-Caffeine] Hives  . Ancef [Cefazolin] Rash    RASH  . Tape Rash    Surgical Tape- 2009ish  Back surgery - broke out- rash    Patient Measurements: Height: 5\' 6"  (167.6 cm) Weight: 242 lb 15.5 oz (110.2 kg) IBW/kg (Calculated) : 63.8   Vital Signs: Temp: 98.2 F (36.8 C) (08/21 0601) Temp Source: Oral (08/21 0601) BP: 136/70 (08/21 0601) Pulse Rate: 79 (08/21 0601)  Labs:  Recent Labs  01/29/16 0504 01/31/16 0705  HGB 7.7* 7.7*  HCT 23.2* 22.8*  PLT 354 372  CREATININE 2.11*  --     Estimated Creatinine Clearance: 41.2 mL/min (by C-G formula based on SCr of 2.11 mg/dL).   Medical History: Past Medical History:  Diagnosis Date  . Arthritis    "severe in my back" (08/11/2015)  . Chronic lower back pain   . Fatty liver   . GERD (gastroesophageal reflux disease)   . Hemorrhoids   . Hyperlipidemia   . Hypertension   . Low iron   . Prediabetes   . Seasonal allergies   . Shortness of breath dyspnea    With exertion    Medications:  Scheduled:  . enoxaparin (LOVENOX) injection  40 mg Subcutaneous Q24H  . famotidine  20 mg Oral BID  . fentaNYL  50 mcg Transdermal Q72H  . insulin aspart  0-15 Units Subcutaneous TID AC & HS  . polyethylene glycol  17 g Oral BID  . senna-docusate  2 tablet Oral QHS  . 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 He was admitted on 01/18/16 for decompressive lam T 10- L1.  He experience ABLA (-5L), hypoxia/ hypotension & was on vent briefly (extub 8/9) as well as AKI.  He also had transient hematuria with INR 1.73, but  DIC (-).  Currently on Lovenox 40mg  qday, with pharmacy consulted to evaluate dosing.  Cr 0.95 on 7/31, peaked on 8/10 at 2.49 Cr 2.11 today, nCrCl 25-30.  Increased from 1.92 on 8/13  Goal of Therapy:  prevention of VTE Monitor platelets by anticoagulation protocol: Yes   Plan:  Continue Lovenox 40mg  SQ q24 d/t renal dysfunction Watch for s/s of bleeding Monitor renal fxn, adjust dosing to 0.5mg /kg if nCrCl > 30  Gracy Bruins, PharmD Clinical Pharmacist [facility]

## 2016-01-31 NOTE — Care Management Note (Signed)
[facility] Individual Statement of Services  Patient Name:  Russell Gonzales  Date:  01/31/2016  Welcome to the [facility].  Our goal is to provide you with an individualized program based on your diagnosis and situation, designed to meet your specific needs.  With this comprehensive rehabilitation program, you will be expected to participate in at least 3 hours of rehabilitation therapies Monday-Friday, with modified therapy programming on the weekends.  Your rehabilitation program will include the following services:  Physical Therapy (PT), Occupational Therapy (OT), 24 hour per day rehabilitation nursing, Case Management (Social Worker), Rehabilitation Medicine, Nutrition Services and Pharmacy Services  Weekly team conferences will be held on Tuesday to discuss your progress.  Your Social Worker will talk with you frequently to get your input and to update you on team discussions.  Team conferences with you and your family in attendance may also be held.  Expected length of stay: 7-10 days  Overall anticipated outcome: supervision/mod/i level  Depending on your progress and recovery, your program may change. Your Social Worker will coordinate services and will keep you informed of any changes. Your Social Worker's name and contact numbers are listed  below.  The following services may also be recommended but are not provided by the [facility] will be made to provide these services after discharge if needed.  Arrangements include referral to agencies that provide these services.  Your insurance has been verified to be:  [employer] Your primary doctor is:  [name]  Pertinent information will be shared with your doctor and your insurance company.  Social Worker:  Lennart Pall, [place] or ([phone]   Information discussed with and copy given to patient by: Elease Hashimoto, 01/31/2016, 9:17 AM

## 2016-01-31 NOTE — Progress Notes (Addendum)
Subjective/Complaints: Pt seen laying in bed this AM eating breakfast. He states he had a boring weekend and is pleased to start a full day of therapies today.    ROS- denies CP, SOB, N/V/D  Objective: Vital Signs: Blood pressure 136/70, pulse 79, temperature 98.2 F (36.8 C), temperature source Oral, resp. rate 18, height _0  (1.676 m), weight 110.2 kg (242 lb 15.5 oz), SpO2 92 %. No results found. Results for orders placed or performed during the hospital encounter of 01/28/16 (from the past 72 hour(s))  Glucose, capillary     Status: None   Collection Time: 01/28/16  4:30 PM  Result Value Ref Range   Glucose-Capillary 98 65 - 99 mg/dL  Glucose, capillary     Status: None   Collection Time: 01/28/16  9:10 PM  Result Value Ref Range   Glucose-Capillary 98 65 - 99 mg/dL  CBC WITH DIFFERENTIAL     Status: Abnormal   Collection Time: 01/29/16  5:04 AM  Result Value Ref Range   WBC 6.0 4.0 - 10.5 K/uL   RBC 2.45 (L) 4.22 - 5.81 MIL/uL   Hemoglobin 7.7 (L) 13.0 - 17.0 g/dL   HCT 23.2 (L) 39.0 - 52.0 %   MCV 94.7 78.0 - 100.0 fL   MCH 31.4 26.0 - 34.0 pg   MCHC 33.2 30.0 - 36.0 g/dL   RDW 12.6 11.5 - 15.5 %   Platelets 354 150 - 400 K/uL   Neutrophils Relative % 58 %   Neutro Abs 3.5 1.7 - 7.7 K/uL   Lymphocytes Relative 24 %   Lymphs Abs 1.4 0.7 - 4.0 K/uL   Monocytes Relative 15 %   Monocytes Absolute 0.9 0.1 - 1.0 K/uL   Eosinophils Relative 3 %   Eosinophils Absolute 0.2 0.0 - 0.7 K/uL   Basophils Relative 0 %   Basophils Absolute 0.0 0.0 - 0.1 K/uL  Comprehensive metabolic panel     Status: Abnormal   Collection Time: 01/29/16  5:04 AM  Result Value Ref Range   Sodium 135 135 - 145 mmol/L   Potassium 4.3 3.5 - 5.1 mmol/L   Chloride 95 (L) 101 - 111 mmol/L   CO2 32 22 - 32 mmol/L   Glucose, Bld 103 (H) 65 - 99 mg/dL   BUN 17 6 - 20 mg/dL   Creatinine, Ser 2.11 (H) 0.61 - 1.24 mg/dL   Calcium 8.6 (L) 8.9 - 10.3 mg/dL   Total Protein 5.1 (L) 6.5 - 8.1 g/dL    Albumin 2.5 (L) 3.5 - 5.0 g/dL   AST 18 15 - 41 U/L   ALT 39 17 - 63 U/L   Alkaline Phosphatase 58 38 - 126 U/L   Total Bilirubin 0.6 0.3 - 1.2 mg/dL   GFR calc non Af Amer 31 (L) >60 mL/min   GFR calc Af Amer 36 (L) >60 mL/min    Comment: (NOTE) The eGFR has been calculated using the CKD EPI equation. This calculation has not been validated in all clinical situations. eGFR's persistently <60 mL/min signify possible Chronic Kidney Disease.    Anion gap 8 5 - 15  Glucose, capillary     Status: Abnormal   Collection Time: 01/29/16  6:38 AM  Result Value Ref Range   Glucose-Capillary 105 (H) 65 - 99 mg/dL  Glucose, capillary     Status: Abnormal   Collection Time: 01/29/16 11:51 AM  Result Value Ref Range   Glucose-Capillary 107 (H) 65 - 99 mg/dL  Glucose, capillary  Status: Abnormal   Collection Time: 01/29/16  4:40 PM  Result Value Ref Range   Glucose-Capillary 123 (H) 65 - 99 mg/dL  Glucose, capillary     Status: Abnormal   Collection Time: 01/29/16  8:41 PM  Result Value Ref Range   Glucose-Capillary 106 (H) 65 - 99 mg/dL  Glucose, capillary     Status: None   Collection Time: 01/30/16  5:47 AM  Result Value Ref Range   Glucose-Capillary 96 65 - 99 mg/dL  Glucose, capillary     Status: None   Collection Time: 01/30/16 12:23 PM  Result Value Ref Range   Glucose-Capillary 93 65 - 99 mg/dL  Glucose, capillary     Status: Abnormal   Collection Time: 01/30/16  4:34 PM  Result Value Ref Range   Glucose-Capillary 101 (H) 65 - 99 mg/dL  Glucose, capillary     Status: Abnormal   Collection Time: 01/30/16  8:36 PM  Result Value Ref Range   Glucose-Capillary 107 (H) 65 - 99 mg/dL  Glucose, capillary     Status: None   Collection Time: 01/31/16  5:39 AM  Result Value Ref Range   Glucose-Capillary 86 65 - 99 mg/dL  CBC     Status: Abnormal   Collection Time: 01/31/16  7:05 AM  Result Value Ref Range   WBC 4.5 4.0 - 10.5 K/uL   RBC 2.43 (L) 4.22 - 5.81 MIL/uL   Hemoglobin  7.7 (L) 13.0 - 17.0 g/dL   HCT 22.8 (L) 39.0 - 52.0 %   MCV 93.8 78.0 - 100.0 fL   MCH 31.7 26.0 - 34.0 pg   MCHC 33.8 30.0 - 36.0 g/dL   RDW 12.5 11.5 - 15.5 %   Platelets 372 150 - 400 K/uL    Gen NAD. Vital signs reviewed. Well-developed.  HEENT: normocephalic, atraumatic.  Cardio: RRR and no murmur Resp: CTA B/L and unlabored GI: BS positive, soft, nontender Musc/Skel:  No edema, no tenderness Neuro: Alert and Oriented  Motor 5/5 in BUE proximal to distal B/l LE: 4+/5 hip flexion, 5/5 knee extension, ankle dorsi/plantar flexion Skin:   Wound healed  Assessment/Plan: 1. Functional deficits secondary to Thoracic myelopathy with paraparesis which require 3+ hours per day of interdisciplinary therapy in a comprehensive inpatient rehab setting. Physiatrist is providing close team supervision and 24 hour management of active medical problems listed below. Physiatrist and rehab team continue to assess barriers to discharge/monitor patient progress toward functional and medical goals. FIM: Function - Bathing Position: Shower Body parts bathed by patient: Right arm, Left arm, Chest, Abdomen, Front perineal area, Right upper leg, Left upper leg, Right lower leg, Left lower leg, Back, Buttocks Body parts bathed by helper: Buttocks, Back Assist Level: Touching or steadying assistance(Pt > 75%) (while leaning laterally)  Function- Upper Body Dressing/Undressing What is the patient wearing?: Pull over shirt/dress Pull over shirt/dress - Perfomed by patient: Thread/unthread right sleeve, Thread/unthread left sleeve, Put head through opening, Pull shirt over trunk Pull over shirt/dress - Perfomed by helper: Pull shirt over trunk Assist Level: Supervision or verbal cues Function - Lower Body Dressing/Undressing What is the patient wearing?: Pants, Non-skid slipper socks Position: Other (comment) (sitting on tub bench) Pants- Performed by patient: Thread/unthread right pants leg,  Thread/unthread left pants leg, Pull pants up/down Pants- Performed by helper: Thread/unthread right pants leg, Thread/unthread left pants leg, Pull pants up/down Non-skid slipper socks- Performed by helper: Don/doff right sock, Don/doff left sock Assist for footwear: Supervision/touching assist Assist for lower  body dressing: Touching or steadying assistance (Pt > 75%)  Function - Toileting Toileting steps completed by patient: Adjust clothing prior to toileting Toileting steps completed by helper: Adjust clothing prior to toileting, Performs perineal hygiene, Adjust clothing after toileting Toileting Assistive Devices: Grab bar or rail Assist level: Touching or steadying assistance (Pt.75%)  Function - Toilet Transfers Toilet transfer assistive device: Walker, Grab bar, Elevated toilet seat/BSC over toilet Assist level to toilet: Touching or steadying assistance (Pt > 75%) Assist level from toilet: Touching or steadying assistance (Pt > 75%)  Function - Chair/bed transfer Chair/bed transfer method: Ambulatory Chair/bed transfer assist level: Touching or steadying assistance (Pt > 75%) Chair/bed transfer assistive device: Armrests  Function - Locomotion: Wheelchair Will patient use wheelchair at discharge?: No Type: Manual Max wheelchair distance: 150 ft Assist Level: Supervision or verbal cues Assist Level: Supervision or verbal cues Assist Level: Supervision or verbal cues Function - Locomotion: Ambulation Assistive device: Walker-rolling Max distance: 20 Assist level: Touching or steadying assistance (Pt > 75%) Assist level: Touching or steadying assistance (Pt > 75%) Walk 50 feet with 2 turns activity did not occur: Safety/medical concerns (fatigue) Walk 150 feet activity did not occur: Safety/medical concerns (fatigue) Walk 10 feet on uneven surfaces activity did not occur: Safety/medical concerns (fatigue)  Function - Comprehension Comprehension: Auditory Comprehension  assist level: Follows complex conversation/direction with no assist  Function - Expression Expression: Verbal Expression assist level: Expresses complex ideas: With no assist  Function - Social Interaction Social Interaction assist level: Interacts appropriately with others - No medications needed., Interacts appropriately 90% of the time - Needs monitoring or encouragement for participation or interaction.  Function - Problem Solving Problem solving assist level: Solves complex 90% of the time/cues < 10% of the time  Function - Memory Memory assist level: Complete Independence: No helper Patient normally able to recall (first 3 days only): Current season, Location of own room, Staff names and faces, That he or she is in a hospital  Medical Problem List and Plan: 1.  Functional and mobility deficits  secondary to thoracic myelopathy s/pt T10-L1 decompression   Cont CIR 2.  DVT Prophylaxis/Anticoagulation: Pharmaceutical: Lovenox 72m daily 3. Pain Management:   Fentanyl patch to 514m, will decrease on 8/24  -continue oxycodone for breakthrough pain 4. Mood: team to provide ego support. no depression on anxiety noted  5. Neuropsych: This patient is capable of making decisions on his own behalf. 6. Skin/Wound Care: local care to back incisions - healed 7. Fluids/Electrolytes/Nutrition: encourage PO.   Check electrolytes upon admit  -poor appetite at present 8. Constipation/neurogenic bowel: augment bowel regimen given myelopathy/narcotics 9. AKI  Cr. 2.11 on 8/19  Meds reviewed, no nephrotoxic meds notes  Will cont to monitor 10. ABLA  Hb. 7.7 on 8/21  Will cont to monitor 11. Morbid Obesity  Body mass index is 39.22 kg/m.  Diet and exercise education  Will cont to encourage weight loss to increase endurance and promote overall health  LOS (Days) 3 A FACE TO FACE EVALUATION WAS PERFORMED  Justyne Roell AnLorie Phenix/21/2017, 8:03 AM

## 2016-01-31 NOTE — Progress Notes (Signed)
Patient information reviewed and entered into eRehab system by Garnie Borchardt, RN, CRRN, PPS Coordinator.  Information including medical coding and functional independence measure will be reviewed and updated through discharge.     Per nursing patient was given "Data Collection Information Summary for Patients in Inpatient Rehabilitation Facilities with attached "Privacy Act Statement-Health Care Records" upon admission.  

## 2016-01-31 NOTE — Progress Notes (Signed)
Physical Therapy Session Note  Patient Details  Name: Russell Gonzales MRN: [id number] Date of Birth: 1950-10-18  Today's Date: 01/31/2016 PT Individual Time: 1100-1155 PT Individual Time Calculation (min): 55 min    Short Term Goals: Week 1:  PT Short Term Goal 1 (Week 1): = LTGs due to anticipated LOS  Skilled Therapeutic Interventions/Progress Updates:    Pt received resting in w/c, does endorse some soreness but does not rate, agreeable to therapy session.  Session focus on activity tolerance, pt education, stair negotiation, balance, and adherence to back precautions.  Pt performs transfers throughout session with RW and supervision.  Ambulation max distance 76' with RW and supervision and verbal cues for upright posture. PT negotiated 4 steps x2 with 2 hand rails with close supervision>steady assist and verbal cues for safe sequencing.  Pt negotiated steps with alternating pattern.  PT instructed pt in activity for balance and maintaining back precautions throwing horse shoes and retrieving using a reacher, mod verbal cues for maintaining back precautions during retrieval of horse shoes.  Pt returned to room in w/c at end of session and positioned upright with call bell in reach and needs met.   Therapy Documentation Precautions:  Precautions Precautions: Fall, Back Required Braces or Orthoses: Spinal Brace Spinal Brace: Thoracolumbosacral orthotic, Applied in sitting position Restrictions Weight Bearing Restrictions: No   See Function Navigator for Current Functional Status.   Therapy/Group: Individual Therapy  Earnest Conroy Penven-Crew 01/31/2016, 11:57 AM

## 2016-01-31 NOTE — Progress Notes (Signed)
Physical Therapy Session Note  Patient Details  Name: Russell Gonzales MRN: [id number] Date of Birth: 1951/05/14  Today's Date: 01/31/2016 PT Individual Time: 1642-9037 PT Individual Time Calculation (min): 29 min    Short Term Goals: Week 1:  PT Short Term Goal 1 (Week 1): = LTGs due to anticipated LOS  Skilled Therapeutic Interventions/Progress Updates:    Pt received resting in w/c with no c/o pain and agreeable to therapy session.  Pt propelled w/c to and from therapy gym for strengthening and overall activity tolerance.  PT administered TUG with RW and pt scored average of 39 seconds across 3 trials indicating increased risk of falls.  PT interpreted results for pt and answered pt's questions to satisfaction.  Pt performing sit<>stand transfers from w/c x4 throughout session with RW and supervision.  Pt amb to bathroom at end of session and performed clothing management and toilet transfer with supervision.  Pt left in bathroom with call cord in reach and needs met.  RN alerted to pt position in bathroom.   Therapy Documentation Precautions:  Precautions Precautions: Fall, Back Required Braces or Orthoses: Spinal Brace Spinal Brace: Thoracolumbosacral orthotic, Applied in sitting position Restrictions Weight Bearing Restrictions: No   See Function Navigator for Current Functional Status.   Therapy/Group: Individual Therapy  Earnest Conroy Penven-Crew 01/31/2016, 4:39 PM

## 2016-01-31 NOTE — Progress Notes (Addendum)
Occupational Therapy Session Note  Patient Details  Name: Russell Gonzales MRN: [id number] Date of Birth: 1951-05-04  Today's Date: 01/31/2016 OT Individual Time: [id number] OT Individual Time Calculation (min): 60 min     Short Term Goals: Week 1:  OT Short Term Goal 1 (Week 1): Pt will complete LB dressing with Min A with use of AE OT Short Term Goal 2 (Week 1): Spouse [name] will don pts back orthosis with supervision  OT Short Term Goal 3 (Week 1): Pt will complete toileting with Min A OT Short Term Goal 4 (Week 1): Pt will recall 3/3 back precautions during ADL session   Skilled Therapeutic Interventions/Progress Updates:   Pt participated in skilled OT tx focusing on activity tolerance, recall of back precautions, and pain mgt. Pt was lying in bed upon skilled OT arrival today. Pt reported just receiving pain medication and feeling better than he was this morning. Pt reported still feeling in pain but agreeable to participate in tx. Supine<sit completed with education on log rolling with cues required for lowering HOB. Back brace donned with Max A at EOB and ACE wraps applied to LEs with Max A. Pt ambulated to bathroom with RW and supervision to urinate. Pt reported still not being able to have BM since arrival at [facility]. Pt reported morning coffee usually helps with regularity. Nursing consulted with consent regarding having pt drink coffee. Pt completed functional ambulation/self propulsion from room to family day room with supervision and rest breaks provided due to pain and limited activity tolerance. Wash cloths placed in underarm region to prevent discomfort with pt verbalizing that they were helpful. Pt completed coffee prep while standing with RW with supervision for safe walker and hand placement. Pt able to recall 1/3 precautions while completing. Visual aide created and placed on mirror in room. At end of session, pt self propelled back to room. Pt was left in w/c with all needs within  reach at end of tx. As pt was drinking coffee, pt reported feeling "the urge coming but not quite there yet." Nursing notified.Therapeutic use of distraction with discussion of grandchildren utilized for pain mgt today.  Ortho tech contacted with report of visiting pt this week to refit back orthosis. Pt and nursing notified.   Therapy Documentation Precautions:  Precautions Precautions: Fall, Back Required Braces or Orthoses: Spinal Brace Spinal Brace: Thoracolumbosacral orthotic, Applied in sitting position Restrictions Weight Bearing Restrictions: No General:   Vital Signs:   Pain:  Pain Assessment Pain Assessment: 0-10 Pain Score: 5  Pain Type: Chronic pain Pain Location: Rib cage Pain Orientation: Left Pain Descriptors / Indicators: Aching;Burning Pain Frequency: Intermittent Pain Onset: On-going Pain Intervention(s): Medication (See eMAR) ADL: ADL ADL Comments: please see functional navigator for ADL status     See Function Navigator for Current Functional Status.   Therapy/Group: Individual Therapy  [name] [name] 01/31/2016, 12:30 PM

## 2016-01-31 NOTE — Progress Notes (Signed)
Social Work Assessment and Plan Social Work Assessment and Plan  Patient Details  Name: Russell Gonzales MRN: [id number] Date of Birth: Jan 21, 1951  Today's Date: 01/31/2016  Problem List:  Patient Active Problem List   Diagnosis Date Noted  . AKI (acute kidney injury) ([place])   . Acute blood loss anemia   . Post-operative pain   . Morbid obesity due to excess calories ([place])   . Encounter for central line placement   . Acute respiratory failure ([place])   . Encounter for intubation   . S/P lumbar fusion 01/18/2016  . 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 Myelopathy ([place]) 01/09/2016  . Back pain 01/09/2016  . Thoracic stenosis 08/11/2015  . 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 Other and unspecified hyperlipidemia 03/11/2013  . Essential hypertension 09/01/2009  . DIZZINESS 09/01/2009  . CHEST PAIN UNSPECIFIED 09/01/2009   Past Medical History:  Past Medical History:  Diagnosis Date  . Arthritis    "severe in my back" (08/11/2015)  . Chronic lower back pain   . Fatty liver   . GERD (gastroesophageal reflux disease)   . Hemorrhoids   . Hyperlipidemia   . Hypertension   . Low iron   . Prediabetes   . Seasonal allergies   . Shortness of breath dyspnea    With exertion   Past Surgical History:  Past Surgical History:  Procedure Laterality Date  . ANKLE FRACTURE SURGERY Left 1972   "it was crushed; has screws and plates"  . ANTERIOR CERVICAL DECOMP/DISCECTOMY FUSION  05/2008  . BACK SURGERY    . COLONOSCOPY W/ BIOPSIES AND POLYPECTOMY    . FRACTURE SURGERY    . KNEE ARTHROSCOPY Left 1970s   "removed cyst"  . LUMBAR LAMINECTOMY/DECOMPRESSION MICRODISCECTOMY N/A 08/11/2015   Procedure: Laminectomy - Thoracic eleven - Thoracic tweleve;  Surgeon: Eustace Moore, MD;  Location: [facility];  Service: Neurosurgery;  Laterality: N/A;  . POLYPECTOMY  2016  . POSTERIOR LUMBAR FUSION  04/2008   "L1-S1; have rods and screws between 4 vertebrae"  . POSTERIOR LUMBAR FUSION 4 LEVEL  N/A 01/18/2016   Procedure: Thoracic Eight-Lumbar Two Laminectomy with posterior decompression/segmental fixation;  Surgeon: [name], MD;  Location: [facility];  Service: Neurosurgery;  Laterality: N/A;  T8 to L2 Laminectomy with posterior decompression/segmental fixation from T8 to L2  . PROSTATE BIOPSY  2012  . THORACIC LAMINECTOMY  08/11/2015   Decompressive thoracic laminectomy, medial facetectomy and foraminotomy T11 and T12   Social History:  reports that he has quit smoking. His smoking use included Cigars. He has a 15.00 pack-year smoking history. He has never used smokeless tobacco. He reports that he drinks about 21.0 oz of alcohol per week . He reports that he uses drugs, including Other-see comments.  Family / Support Systems Marital Status: Married Patient Roles: Spouse, Parent Spouse/Significant Other: [name]  [phone] Children: Five children all out of town Other Supports: Friends and church members Anticipated Caregiver: Wife Ability/Limitations of Caregiver: Wife works 7a-7p and half day on Friday Caregiver Availability: Evenings only Family Dynamics: Close with thier children who are supportive but live out of town. 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 He has had other back surgeries and has done well and  is he hopeful he will again. He is one to do for himself and not rely upon others and plans to do this again. Recent Psychosocial Issues: other past back surgeries Pyschiatric History: No history deferred depression screen due to pt is coping appropriately and knows what he needs to accomplish while here. Substance Abuse History: No issues  Patient / Family Perceptions, Expectations & Goals Pt/Family understanding of illness & functional limitations: Pt and wife can explain his surgery and the treatment plan ahead of him. Wife has spoken with the MD and feel they have a good understanding and their questions have been answered. Premorbid pt/family roles/activities: Husband, Grandfather, father, retiree, church member, etc Anticipated changes in roles/activities/participation: resume Pt/family expectations/goals: Pt states: " I want to be able to take care of myself, that is why I am here."  Wife states: " I want him to be safe at home while I'm working."  [employer]: Other (Comment) Premorbid Home Care/DME Agencies: None (had in past) Transportation available at discharge: Wife and friends  Discharge Planning Living Arrangements: Spouse/significant other Support Systems: Spouse/significant other, Children, Other relatives, Friends/neighbors, Social worker community Type of Residence: Private residence Insurance Resources: Multimedia programmer (specify) ([employer]) Financial Resources: Family Support, SSD Financial Screen Referred: No Living Expenses: Own Money Management: Spouse, Patient Does the patient have any problems obtaining your medications?: No Home Management: Wife, pt helped until back got so bad Patient/Family Preliminary Plans: Return home with wife who will be there in the evenings, works during the day.  Both aware of the rehab process and the goals and expected length of stay.  Social Work Anticipated Follow Up Needs: HH/OP  Clinical  Impression Pleasant gentleman who is pushing through his pain to participate in therapies, he has been through back surgeries before and knows what to expect. His wife was here over the weekend and observed him in therapies. Pt needs to be mod/i due to wife's work schedule. Will await team conference tomorrow and develop a safe discharge plan.  Elease Hashimoto 01/31/2016, 9:30 AM

## 2016-02-01 ENCOUNTER — Inpatient Hospital Stay (HOSPITAL_COMMUNITY): Payer: PPO | Admitting: Physical Therapy

## 2016-02-01 ENCOUNTER — Inpatient Hospital Stay (HOSPITAL_COMMUNITY): Payer: PPO | Admitting: Occupational Therapy

## 2016-02-01 DIAGNOSIS — R0781 Pleurodynia: Secondary | ICD-10-CM

## 2016-02-01 DIAGNOSIS — L219 Seborrheic dermatitis, unspecified: Secondary | ICD-10-CM

## 2016-02-01 LAB — GLUCOSE, CAPILLARY
GLUCOSE-CAPILLARY: 94 mg/dL (ref 65–99)
GLUCOSE-CAPILLARY: 108 mg/dL — AB (ref 65–99)
Glucose-Capillary: 106 mg/dL — ABNORMAL HIGH (ref 65–99)
Glucose-Capillary: 159 mg/dL — ABNORMAL HIGH (ref 65–99)

## 2016-02-01 NOTE — Progress Notes (Signed)
Recreational Therapy Session Note  Patient Details  Name: Russell Gonzales MRN: [id number] Date of Birth: 1951/05/25 Today's Date: 02/01/2016  Met with pt to discuss TR services.  Pt with limited leisure participation PTA and describes himself as sedentary.  Education provided on the importance of being active and it's effect on overall health and wellness.  Pt stated understanding but lacks motivation to follow through with "active" tasks.  Discussed home exercise program ideas with pt, PT states intent to provide home exercise program at discharge.  No further TR at this time.  Will continue to monitor through team for future participation. [place] 02/01/2016, 1:57 PM

## 2016-02-01 NOTE — Progress Notes (Signed)
Occupational Therapy Session Note  Patient Details  Name: Russell Gonzales MRN: [id number] Date of Birth: 1950-10-12  Today's Date: 02/01/2016 OT Individual Time: 0700-0759 OT Individual Time Calculation (min): 59 min     Short Term Goals: Week 1:  OT Short Term Goal 1 (Week 1): Pt will complete LB dressing with Min A with use of AE OT Short Term Goal 2 (Week 1): Spouse [name] will don pts back orthosis with supervision  OT Short Term Goal 3 (Week 1): Pt will complete toileting with Min A OT Short Term Goal 4 (Week 1): Pt will recall 3/3 back precautions during ADL session   Skilled Therapeutic Interventions/Progress Updates:    Upon entering the room, pt supine in bed with 3/10 c/o soreness in L rib area. Pt requesting to shower this session. Pt ambulating with RW and close supervision into bathroom for steady assist to stand pivot onto shower bench. Pt engaged in bathing with use of long handled sponge to increase I with LB bathing. Pt performed lateral leans in order to wash buttocks while remaining seated. Pt transferred from shower onto toilet for BM during this session with required verbal cues to maintain precautions while performing hygiene. Pt donning clothing items from toilet secondary to energy conservation at this time. Pt able to cross ankle to opposite knee in order to don pants onto B feet with steady assist for balance. Pt returning to sit in recliner chair at end of session. Call bell and all needed items within reach upon exiting the room.   Therapy Documentation Precautions:  Precautions Precautions: Fall, Back Required Braces or Orthoses: Spinal Brace Spinal Brace: Thoracolumbosacral orthotic, Applied in sitting position Restrictions Weight Bearing Restrictions: No General:   Vital Signs: Therapy Vitals Temp: 98.1 F (36.7 C) Temp Source: Oral Pulse Rate: 93 Resp: 20 BP: 126/80 Patient Position (if appropriate): Lying Oxygen Therapy SpO2: 98 % O2 Device:  Not Delivered Pain: Pain Assessment Pain Assessment: 0-10 Pain Score: 3  Pain Type: Acute pain Pain Location: Rib cage Pain Orientation: Left Pain Descriptors / Indicators: Aching;Constant Pain Frequency: Constant Pain Onset: On-going Patients Stated Pain Goal: 2 Pain Intervention(s): Medication (See eMAR);Repositioned ADL: ADL ADL Comments: please see functional navigator for ADL status  See Function Navigator for Current Functional Status.   Therapy/Group: Individual Therapy  [name] 02/01/2016, 9:01 AM

## 2016-02-01 NOTE — Progress Notes (Signed)
Subjective/Complaints: Pt seen this AM ambulating from restroom after BM.  He states he slept well after falling asleep.  He is easily fatigued.  ROS- +Fatiguability, left rib pain.  denies CP, SOB, N/V/D  Objective: Vital Signs: Blood pressure 126/80, pulse 93, temperature 98.1 F (36.7 C), temperature source Oral, resp. rate 20, height 5\' 6"  (1.676 m), weight 110.2 kg (242 lb 15.5 oz), SpO2 98 %. No results found. Results for orders placed or performed during the hospital encounter of 01/28/16 (from the past 72 hour(s))  Glucose, capillary     Status: Abnormal   Collection Time: 01/29/16 11:51 AM  Result Value Ref Range   Glucose-Capillary 107 (H) 65 - 99 mg/dL  Glucose, capillary     Status: Abnormal   Collection Time: 01/29/16  4:40 PM  Result Value Ref Range   Glucose-Capillary 123 (H) 65 - 99 mg/dL  Glucose, capillary     Status: Abnormal   Collection Time: 01/29/16  8:41 PM  Result Value Ref Range   Glucose-Capillary 106 (H) 65 - 99 mg/dL  Glucose, capillary     Status: None   Collection Time: 01/30/16  5:47 AM  Result Value Ref Range   Glucose-Capillary 96 65 - 99 mg/dL  Glucose, capillary     Status: None   Collection Time: 01/30/16 12:23 PM  Result Value Ref Range   Glucose-Capillary 93 65 - 99 mg/dL  Glucose, capillary     Status: Abnormal   Collection Time: 01/30/16  4:34 PM  Result Value Ref Range   Glucose-Capillary 101 (H) 65 - 99 mg/dL  Glucose, capillary     Status: Abnormal   Collection Time: 01/30/16  8:36 PM  Result Value Ref Range   Glucose-Capillary 107 (H) 65 - 99 mg/dL  Glucose, capillary     Status: None   Collection Time: 01/31/16  5:39 AM  Result Value Ref Range   Glucose-Capillary 86 65 - 99 mg/dL  CBC     Status: Abnormal   Collection Time: 01/31/16  7:05 AM  Result Value Ref Range   WBC 4.5 4.0 - 10.5 K/uL   RBC 2.43 (L) 4.22 - 5.81 MIL/uL   Hemoglobin 7.7 (L) 13.0 - 17.0 g/dL   HCT 22.8 (L) 39.0 - 52.0 %   MCV 93.8 78.0 - 100.0 fL    MCH 31.7 26.0 - 34.0 pg   MCHC 33.8 30.0 - 36.0 g/dL   RDW 12.5 11.5 - 15.5 %   Platelets 372 150 - 400 K/uL  Glucose, capillary     Status: None   Collection Time: 01/31/16 11:58 AM  Result Value Ref Range   Glucose-Capillary 98 65 - 99 mg/dL  Glucose, capillary     Status: Abnormal   Collection Time: 01/31/16  5:11 PM  Result Value Ref Range   Glucose-Capillary 112 (H) 65 - 99 mg/dL  Glucose, capillary     Status: Abnormal   Collection Time: 01/31/16  8:36 PM  Result Value Ref Range   Glucose-Capillary 104 (H) 65 - 99 mg/dL   Comment 1 Notify RN   Glucose, capillary     Status: None   Collection Time: 02/01/16  6:55 AM  Result Value Ref Range   Glucose-Capillary 94 65 - 99 mg/dL    Gen NAD. Vital signs reviewed. Well-developed.  HEENT: normocephalic, atraumatic.  Cardio: RRR and no murmur Resp: CTA B/L and unlabored GI: BS positive, soft, nontender Musc/Skel:  No edema, no tenderness Neuro: Alert and Oriented  Motor 5/5  in BUE proximal to distal B/l LE: 4+/5 hip flexion, 5/5 knee extension, ankle dorsi/plantar flexion Skin:   Wound healed. Seborrhea on face.   Assessment/Plan: 1. Functional deficits secondary to Thoracic myelopathy with paraparesis which require 3+ hours per day of interdisciplinary therapy in a comprehensive inpatient rehab setting. Physiatrist is providing close team supervision and 24 hour management of active medical problems listed below. Physiatrist and rehab team continue to assess barriers to discharge/monitor patient progress toward functional and medical goals. FIM: Function - Bathing Position: Shower Body parts bathed by patient: Right arm, Left arm, Chest, Abdomen, Front perineal area, Right upper leg, Left upper leg, Right lower leg, Left lower leg, Back, Buttocks Body parts bathed by helper: Buttocks, Back Assist Level: Touching or steadying assistance(Pt > 75%) (while leaning laterally)  Function- Upper Body Dressing/Undressing What is  the patient wearing?: Pull over shirt/dress Pull over shirt/dress - Perfomed by patient: Thread/unthread right sleeve, Thread/unthread left sleeve, Put head through opening, Pull shirt over trunk Pull over shirt/dress - Perfomed by helper: Pull shirt over trunk Assist Level: Supervision or verbal cues Function - Lower Body Dressing/Undressing What is the patient wearing?: Pants, Non-skid slipper socks Position: Other (comment) (sitting on tub bench) Pants- Performed by patient: Thread/unthread right pants leg, Thread/unthread left pants leg, Pull pants up/down Pants- Performed by helper: Thread/unthread right pants leg, Thread/unthread left pants leg, Pull pants up/down Non-skid slipper socks- Performed by helper: Don/doff right sock, Don/doff left sock Assist for footwear: Supervision/touching assist Assist for lower body dressing: Touching or steadying assistance (Pt > 75%)  Function - Toileting Toileting steps completed by patient: Adjust clothing prior to toileting Toileting steps completed by helper: Adjust clothing prior to toileting, Performs perineal hygiene, Adjust clothing after toileting Toileting Assistive Devices: Grab bar or rail Assist level: Touching or steadying assistance (Pt.75%)  Function - Air cabin crew transfer assistive device: Walker, Grab bar, Elevated toilet seat/BSC over toilet Assist level to toilet: Touching or steadying assistance (Pt > 75%) Assist level from toilet: Touching or steadying assistance (Pt > 75%)  Function - Chair/bed transfer Chair/bed transfer method: Ambulatory, Stand pivot Chair/bed transfer assist level: Supervision or verbal cues Chair/bed transfer assistive device: Armrests, Walker  Function - Locomotion: Wheelchair Will patient use wheelchair at discharge?: No Type: Manual Max wheelchair distance: 150 Assist Level: Supervision or verbal cues Assist Level: Supervision or verbal cues Assist Level: Supervision or verbal  cues Turns around,maneuvers to table,bed, and toilet,negotiates 3% grade,maneuvers on rugs and over doorsills: No Function - Locomotion: Ambulation Assistive device: Walker-rolling Max distance: 15 Assist level: Supervision or verbal cues Assist level: Supervision or verbal cues Walk 50 feet with 2 turns activity did not occur: Safety/medical concerns (fatigue) Assist level: Supervision or verbal cues Walk 150 feet activity did not occur: Safety/medical concerns (fatigue) Walk 10 feet on uneven surfaces activity did not occur: Safety/medical concerns (fatigue)  Function - Comprehension Comprehension: Auditory Comprehension assist level: Follows complex conversation/direction with no assist  Function - Expression Expression: Verbal Expression assist level: Expresses complex ideas: With no assist  Function - Social Interaction Social Interaction assist level: Interacts appropriately with others with medication or extra time (anti-anxiety, antidepressant).  Function - Problem Solving Problem solving assist level: Solves complex 90% of the time/cues < 10% of the time  Function - Memory Memory assist level: Complete Independence: No helper Patient normally able to recall (first 3 days only): Current season, Location of own room, Staff names and faces, That he or she is in a hospital  Medical Problem List and Plan: 1.  Functional and mobility deficits  secondary to thoracic myelopathy s/pt T10-L1 decompression   Cont CIR 2.  DVT Prophylaxis/Anticoagulation: Pharmaceutical: Lovenox 40mg  daily 3. Pain Management:   Fentanyl patch to 70mcg, will decrease on 8/24  -continue oxycodone for breakthrough pain 4. Mood: team to provide ego support. no depression on anxiety noted  5. Neuropsych: This patient is capable of making decisions on his own behalf. 6. Skin/Wound Care: local care to back incisions - healed 7. Fluids/Electrolytes/Nutrition: encourage PO.   Check electrolytes upon  admit  -poor appetite at present 8. Constipation/neurogenic bowel: augment bowel regimen given myelopathy/narcotics 9. AKI  Cr. 2.11 on 8/19  Meds reviewed, no nephrotoxic meds notes  Will cont to monitor  Labs ordered for tomorrow 10. ABLA  Hb. 7.7 on 8/21  Will cont to monitor  Labs ordered for tomorrow 11. Morbid Obesity  Body mass index is 39.22 kg/m.  Diet and exercise education  Will cont to encourage weight loss to increase endurance and promote overall health 12. Seborrhea  On face, chronic  Cont to monitor 13. Left rib pain  Likely from surgery +/- TLSO  No increase in pain with breathing, no TTP  Cont to monitor  LOS (Days) 4 A FACE TO FACE EVALUATION WAS PERFORMED  Ankit Lorie Phenix 02/01/2016, 8:16 AM

## 2016-02-01 NOTE — Progress Notes (Signed)
Physical Therapy Session Note  Patient Details  Name: Russell Gonzales MRN: [id number] Date of Birth: [birth year]  Today's Date: 02/01/2016 PT Individual Time: 0905-1000 PT Individual Time Calculation (min): 55 min    Short Term Goals: Week 1:  PT Short Term Goal 1 (Week 1): = LTGs due to anticipated LOS  Skilled Therapeutic Interventions/Progress Updates:    Pt received resting in w/c and agreeable to session.  Pt endorses "soreness" in lower back and L rib cage but states he was premedicated.  Session focus on transfers and gait, activity tolerance, LE strengthening, balance, and pt education.    Pt propels w/c to and from therapy gym mod I.  Sit<>stand throughout session with RW and supervision with verbal cues for reaching back for chair prior to beginning descent into chair.  Pt verbalizes understanding but does not demonstrate carry over.    PT instructed pt in BLE therex 2x10 reps LAQ with 3 second hold, standing heel/toe raises, minisquats, and standing marching with verbal cues for upright posture with standing exercises.    Gait training 2x95' with RW and distant supervision with verbal cues for upright posture with little carryover.  Pt education on posture prior to surgery and importance of postural re-training following surgery to improve outcome.  Pt verbalized understanding.    Pt returned to room at end of session and performed ambulatory transfer to EOB, sit>R side lying with supervision and bed rail, roll to back mod I using bed rail.  Pt positioned to comfort with call bell in reach and needs met.   Therapy Documentation Precautions:  Precautions Precautions: Fall, Back Required Braces or Orthoses: Spinal Brace Spinal Brace: Thoracolumbosacral orthotic, Applied in sitting position Restrictions Weight Bearing Restrictions: No   See Function Navigator for Current Functional Status.   Therapy/Group: Individual Therapy  [name] 02/01/2016, 10:03 AM

## 2016-02-01 NOTE — Progress Notes (Signed)
Physical Therapy Session Note  Patient Details  Name: Russell Gonzales MRN: [id number] Date of Birth: May 20, 1951  Today's Date: 02/01/2016 PT Individual Time: 1450-1606 PT Individual Time Calculation (min): 76 min    Short Term Goals: Week 1:  PT Short Term Goal 1 (Week 1): = LTGs due to anticipated LOS  Skilled Therapeutic Interventions/Progress Updates:    Pt received resting in bed, no c/o pain and agreeable to therapy.  Session focus on high level gait training 2x through obstacle course simulating home environment with RW and steady assist fade to supervision on second trial.  Pt able to step over and around obstacles and over compliant surfaces with min verbal cues for safety.  Nustep x10 minutes at level 3 for overall cardiopulmonary endurance.  PT instructed pt in HEP for falls prevention x10 reps and provided pt with handout, which he was able to then teach back to therapist.  Discussed progression at d/c from HHPT to outpatient PT to [place] program.  Pt verbalized understanding of important of maintaining activity levels.  Pt returned to room at end of session and positioned upright in w/c with call bell in reach and needs met.   Therapy Documentation Precautions:  Precautions Precautions: Fall, Back Required Braces or Orthoses: Spinal Brace Spinal Brace: Thoracolumbosacral orthotic, Applied in sitting position Restrictions Weight Bearing Restrictions: No   See Function Navigator for Current Functional Status.   Therapy/Group: Individual Therapy  Earnest Conroy Penven-Crew 02/01/2016, 4:37 PM

## 2016-02-02 ENCOUNTER — Inpatient Hospital Stay (HOSPITAL_COMMUNITY): Payer: PPO | Admitting: Occupational Therapy

## 2016-02-02 ENCOUNTER — Inpatient Hospital Stay (HOSPITAL_COMMUNITY): Payer: PPO | Admitting: Physical Therapy

## 2016-02-02 DIAGNOSIS — M792 Neuralgia and neuritis, unspecified: Secondary | ICD-10-CM

## 2016-02-02 DIAGNOSIS — E871 Hypo-osmolality and hyponatremia: Secondary | ICD-10-CM

## 2016-02-02 LAB — GLUCOSE, CAPILLARY
GLUCOSE-CAPILLARY: 107 mg/dL — AB (ref 65–99)
Glucose-Capillary: 109 mg/dL — ABNORMAL HIGH (ref 65–99)
Glucose-Capillary: 81 mg/dL (ref 65–99)
Glucose-Capillary: 188 mg/dL — ABNORMAL HIGH (ref 65–99)

## 2016-02-02 LAB — BASIC METABOLIC PANEL
Anion gap: 7 (ref 5–15)
BUN: 14 mg/dL (ref 6–20)
CHLORIDE: 95 mmol/L — AB (ref 101–111)
CO2: 32 mmol/L (ref 22–32)
Calcium: 8.6 mg/dL — ABNORMAL LOW (ref 8.9–10.3)
Creatinine, Ser: 1.83 mg/dL — ABNORMAL HIGH (ref 0.61–1.24)
GFR calc Af Amer: 43 mL/min — ABNORMAL LOW (ref >60)
GFR calc non Af Amer: 37 mL/min — ABNORMAL LOW (ref >60)
Glucose, Bld: 107 mg/dL — ABNORMAL HIGH (ref 65–99)
POTASSIUM: 4.1 mmol/L (ref 3.5–5.1)
SODIUM: 134 mmol/L — AB (ref 135–145)

## 2016-02-02 LAB — CBC WITH DIFFERENTIAL/PLATELET
Basophils Absolute: 0 10*3/uL (ref 0.0–0.1)
Basophils Relative: 0 %
Eosinophils Absolute: 0.2 10*3/uL (ref 0.0–0.7)
Eosinophils Relative: 3 %
HCT: 22.6 % — ABNORMAL LOW (ref 39.0–52.0)
HEMOGLOBIN: 7.4 g/dL — AB (ref 13.0–17.0)
LYMPHS ABS: 1.3 10*3/uL (ref 0.7–4.0)
Lymphocytes Relative: 28 %
MCH: 30.6 pg (ref 26.0–34.0)
MCHC: 32.7 g/dL (ref 30.0–36.0)
MCV: 93.4 fL (ref 78.0–100.0)
Monocytes Absolute: 0.6 10*3/uL (ref 0.1–1.0)
Monocytes Relative: 13 %
NEUTROS PCT: 56 %
Neutro Abs: 2.6 10*3/uL (ref 1.7–7.7)
Platelets: 425 10*3/uL — ABNORMAL HIGH (ref 150–400)
RBC: 2.42 MIL/uL — AB (ref 4.22–5.81)
RDW: 12.7 % (ref 11.5–15.5)
WBC: 4.7 10*3/uL (ref 4.0–10.5)

## 2016-02-02 MED ORDER — ENOXAPARIN SODIUM 60 MG/0.6ML ~~LOC~~ SOLN
55.0000 mg | SUBCUTANEOUS | Status: DC
  Administered 2016-02-02 – 2016-02-03 (×2): 55 mg via SUBCUTANEOUS
  Filled 2016-02-02 (×2): qty 0.6

## 2016-02-02 MED ORDER — GABAPENTIN 600 MG PO TABS
300.0000 mg | ORAL_TABLET | Freq: Three times a day (TID) | ORAL | Status: DC
  Administered 2016-02-02 (×3): 300 mg via ORAL
  Filled 2016-02-02 (×3): qty 1

## 2016-02-02 NOTE — Progress Notes (Signed)
Subjective/Complaints: Pt sitting at the edge of the bed about to use the urinal.  He continues to have pain from his back that wraps around his ribs.  ROS- +Fatiguability, left rib pain.  denies CP, SOB, N/V/D  Objective: Vital Signs: Blood pressure (!) 147/80, pulse 90, temperature 98 F (36.7 C), temperature source Oral, resp. rate 20, height '5\' 6"'$  (1.676 m), weight 113.2 kg (249 lb 8 oz), SpO2 97 %. No results found. Results for orders placed or performed during the hospital encounter of 01/28/16 (from the past 72 hour(s))  Glucose, capillary     Status: None   Collection Time: 01/30/16 12:23 PM  Result Value Ref Range   Glucose-Capillary 93 65 - 99 mg/dL  Glucose, capillary     Status: Abnormal   Collection Time: 01/30/16  4:34 PM  Result Value Ref Range   Glucose-Capillary 101 (H) 65 - 99 mg/dL  Glucose, capillary     Status: Abnormal   Collection Time: 01/30/16  8:36 PM  Result Value Ref Range   Glucose-Capillary 107 (H) 65 - 99 mg/dL  Glucose, capillary     Status: None   Collection Time: 01/31/16  5:39 AM  Result Value Ref Range   Glucose-Capillary 86 65 - 99 mg/dL  CBC     Status: Abnormal   Collection Time: 01/31/16  7:05 AM  Result Value Ref Range   WBC 4.5 4.0 - 10.5 K/uL   RBC 2.43 (L) 4.22 - 5.81 MIL/uL   Hemoglobin 7.7 (L) 13.0 - 17.0 g/dL   HCT 22.8 (L) 39.0 - 52.0 %   MCV 93.8 78.0 - 100.0 fL   MCH 31.7 26.0 - 34.0 pg   MCHC 33.8 30.0 - 36.0 g/dL   RDW 12.5 11.5 - 15.5 %   Platelets 372 150 - 400 K/uL  Glucose, capillary     Status: None   Collection Time: 01/31/16 11:58 AM  Result Value Ref Range   Glucose-Capillary 98 65 - 99 mg/dL  Glucose, capillary     Status: Abnormal   Collection Time: 01/31/16  5:11 PM  Result Value Ref Range   Glucose-Capillary 112 (H) 65 - 99 mg/dL  Glucose, capillary     Status: Abnormal   Collection Time: 01/31/16  8:36 PM  Result Value Ref Range   Glucose-Capillary 104 (H) 65 - 99 mg/dL   Comment 1 Notify RN    Glucose, capillary     Status: None   Collection Time: 02/01/16  6:55 AM  Result Value Ref Range   Glucose-Capillary 94 65 - 99 mg/dL  Glucose, capillary     Status: Abnormal   Collection Time: 02/01/16 11:22 AM  Result Value Ref Range   Glucose-Capillary 108 (H) 65 - 99 mg/dL  Glucose, capillary     Status: Abnormal   Collection Time: 02/01/16  4:37 PM  Result Value Ref Range   Glucose-Capillary 106 (H) 65 - 99 mg/dL  Glucose, capillary     Status: Abnormal   Collection Time: 02/01/16  8:51 PM  Result Value Ref Range   Glucose-Capillary 159 (H) 65 - 99 mg/dL   Comment 1 Notify RN   Basic metabolic panel     Status: Abnormal   Collection Time: 02/02/16  5:38 AM  Result Value Ref Range   Sodium 134 (L) 135 - 145 mmol/L   Potassium 4.1 3.5 - 5.1 mmol/L   Chloride 95 (L) 101 - 111 mmol/L   CO2 32 22 - 32 mmol/L   Glucose,  Bld 107 (H) 65 - 99 mg/dL   BUN 14 6 - 20 mg/dL   Creatinine, Ser 8.00 (H) 0.61 - 1.24 mg/dL   Calcium 8.6 (L) 8.9 - 10.3 mg/dL   GFR calc non Af Amer 37 (L) >60 mL/min   GFR calc Af Amer 43 (L) >60 mL/min    Comment: (NOTE) The eGFR has been calculated using the CKD EPI equation. This calculation has not been validated in all clinical situations. eGFR's persistently <60 mL/min signify possible Chronic Kidney Disease.    Anion gap 7 5 - 15  CBC with Differential/Platelet     Status: Abnormal   Collection Time: 02/02/16  5:38 AM  Result Value Ref Range   WBC 4.7 4.0 - 10.5 K/uL   RBC 2.42 (L) 4.22 - 5.81 MIL/uL   Hemoglobin 7.4 (L) 13.0 - 17.0 g/dL   HCT 44.7 (L) 15.8 - 06.3 %   MCV 93.4 78.0 - 100.0 fL   MCH 30.6 26.0 - 34.0 pg   MCHC 32.7 30.0 - 36.0 g/dL   RDW 86.8 54.8 - 83.0 %   Platelets 425 (H) 150 - 400 K/uL   Neutrophils Relative % 56 %   Neutro Abs 2.6 1.7 - 7.7 K/uL   Lymphocytes Relative 28 %   Lymphs Abs 1.3 0.7 - 4.0 K/uL   Monocytes Relative 13 %   Monocytes Absolute 0.6 0.1 - 1.0 K/uL   Eosinophils Relative 3 %   Eosinophils  Absolute 0.2 0.0 - 0.7 K/uL   Basophils Relative 0 %   Basophils Absolute 0.0 0.0 - 0.1 K/uL  Glucose, capillary     Status: Abnormal   Collection Time: 02/02/16  6:37 AM  Result Value Ref Range   Glucose-Capillary 109 (H) 65 - 99 mg/dL    Gen NAD. Vital signs reviewed. Well-developed.  HEENT: normocephalic, atraumatic.  Cardio: RRR and no murmur Resp: CTA B/L and unlabored GI: BS positive, soft, nontender Musc/Skel:  No edema, no tenderness Neuro: Alert and Oriented  Motor 5/5 in BUE proximal to distal B/l LE: 4+/5 hip flexion, 5/5 knee extension, ankle dorsi/plantar flexion Skin:   Wound healed. Seborrhea on face.   Assessment/Plan: 1. Functional deficits secondary to Thoracic myelopathy with paraparesis which require 3+ hours per day of interdisciplinary therapy in a comprehensive inpatient rehab setting. Physiatrist is providing close team supervision and 24 hour management of active medical problems listed below. Physiatrist and rehab team continue to assess barriers to discharge/monitor patient progress toward functional and medical goals. FIM: Function - Bathing Position: Shower Body parts bathed by patient: Right arm, Left arm, Chest, Abdomen, Front perineal area, Right upper leg, Left upper leg, Right lower leg, Left lower leg, Back, Buttocks Body parts bathed by helper: Buttocks, Back Assist Level: Touching or steadying assistance(Pt > 75%)  Function- Upper Body Dressing/Undressing What is the patient wearing?: Pull over shirt/dress Pull over shirt/dress - Perfomed by patient: Thread/unthread right sleeve, Thread/unthread left sleeve, Put head through opening, Pull shirt over trunk Pull over shirt/dress - Perfomed by helper: Pull shirt over trunk Assist Level: Set up Set up : To obtain clothing/put away Function - Lower Body Dressing/Undressing What is the patient wearing?: Pants, Non-skid slipper socks Position: Sitting EOB Pants- Performed by patient:  Thread/unthread right pants leg, Thread/unthread left pants leg, Pull pants up/down, Fasten/unfasten pants Pants- Performed by helper: Thread/unthread right pants leg, Thread/unthread left pants leg, Pull pants up/down Non-skid slipper socks- Performed by helper: Don/doff right sock, Don/doff left sock Assist for  footwear: Dependant Assist for lower body dressing: Touching or steadying assistance (Pt > 75%)  Function - Toileting Toileting steps completed by patient: Adjust clothing prior to toileting, Performs perineal hygiene, Adjust clothing after toileting Toileting steps completed by helper: Adjust clothing prior to toileting, Performs perineal hygiene, Adjust clothing after toileting Toileting Assistive Devices: Grab bar or rail Assist level: Touching or steadying assistance (Pt.75%)  Function - Toilet Transfers Toilet transfer assistive device: Walker, Grab bar, Elevated toilet seat/BSC over toilet Assist level to toilet: Touching or steadying assistance (Pt > 75%) Assist level from toilet: Touching or steadying assistance (Pt > 75%)  Function - Chair/bed transfer Chair/bed transfer method: Ambulatory Chair/bed transfer assist level: Supervision or verbal cues Chair/bed transfer assistive device: Armrests, Walker Chair/bed transfer details: Verbal cues for precautions/safety  Function - Locomotion: Wheelchair Will patient use wheelchair at discharge?: No Type: Manual Max wheelchair distance: 150 Assist Level: No help, No cues, assistive device, takes more than reasonable amount of time Assist Level: No help, No cues, assistive device, takes more than reasonable amount of time Assist Level: No help, No cues, assistive device, takes more than reasonable amount of time Turns around,maneuvers to table,bed, and toilet,negotiates 3% grade,maneuvers on rugs and over doorsills: No Function - Locomotion: Ambulation Assistive device: Walker-rolling Max distance: 95 Assist level:  Supervision or verbal cues Assist level: Supervision or verbal cues Walk 50 feet with 2 turns activity did not occur: Safety/medical concerns (fatigue) Assist level: Supervision or verbal cues Walk 150 feet activity did not occur: Safety/medical concerns (fatigue) Walk 10 feet on uneven surfaces activity did not occur: Safety/medical concerns (fatigue)  Function - Comprehension Comprehension: Auditory Comprehension assist level: Follows complex conversation/direction with no assist  Function - Expression Expression: Verbal Expression assist level: Expresses complex ideas: With no assist  Function - Social Interaction Social Interaction assist level: Interacts appropriately with others with medication or extra time (anti-anxiety, antidepressant).  Function - Problem Solving Problem solving assist level: Solves complex 90% of the time/cues < 10% of the time  Function - Memory Memory assist level: Complete Independence: No helper Patient normally able to recall (first 3 days only): Current season, Location of own room, Staff names and faces, That he or she is in a hospital  Medical Problem List and Plan: 1.  Functional and mobility deficits  secondary to thoracic myelopathy s/pt T10-L1 decompression   Cont CIR 2.  DVT Prophylaxis/Anticoagulation: Pharmaceutical: Lovenox '40mg'$  daily 3. Pain Management:   Fentanyl patch to 70mg, will decrease on 8/24  Gabapentin 300 TID started on 8/23  -continue oxycodone for breakthrough pain 4. Mood: team to provide ego support. no depression on anxiety noted  5. Neuropsych: This patient is capable of making decisions on his own behalf. 6. Skin/Wound Care: local care to back incisions - healed 7. Fluids/Electrolytes/Nutrition: encourage PO.   Check electrolytes upon admit  -poor appetite at present 8. Constipation/neurogenic bowel: augment bowel regimen given myelopathy/narcotics 9. AKI  Cr. 1.83 on 8/23 (improving)  Meds reviewed, no  nephrotoxic meds notes  Will cont to monitor 10. ABLA  Hb. 7.4 on 8/23  Will cont to monitor  Labs ordered for tomorrow 11. Morbid Obesity  Body mass index is 40.27 kg/m.  Diet and exercise education  Will cont to encourage weight loss to increase endurance and promote overall health 12. Seborrhea  On face, chronic  Cont to monitor 13. Left rib pain radiating from back  See pain 14. Mild hyponatremia  Na 134 on 8/23  LOS (Days)  5 A FACE TO FACE EVALUATION WAS PERFORMED  Russell Gonzales [place] 02/02/2016, 7:57 AM

## 2016-02-02 NOTE — Progress Notes (Signed)
Occupational Therapy Session Note  Patient Details  Name: Russell Gonzales MRN: [id number] Date of Birth: Oct 23, 1950  Today's Date: 02/02/2016 OT Individual Time: 1330-1400 OT Individual Time Calculation (min): 30 min     Short Term Goals: Week 1:  OT Short Term Goal 1 (Week 1): Pt will complete LB dressing with Min A with use of AE OT Short Term Goal 2 (Week 1): Spouse [name] will don pts back orthosis with supervision  OT Short Term Goal 3 (Week 1): Pt will complete toileting with Min A OT Short Term Goal 4 (Week 1): Pt will recall 3/3 back precautions during ADL session   Skilled Therapeutic Interventions/Progress Updates:    Upon entering the room, pt supine in bed with visitor present in the room. Pt reports pain remains at a 6/10 and has not decreased this day. Pt continues to verbalize he is fatigued during this session. OT assisted pt with donning TLSO while seated on EOB. Pt performed stand pivot transfer into wheelchair with RW and supervision. Pt propelled wheelchair to tub room this session. Pt verbalizes he owns a shower seat in tub/shower combination. OT having pt demonstrate technique in order to assess for safety. Pt utilized RW and grab bar in order to step over ledge with B feet and then sitting onto shower chair with supervision. OT educated pt that he must have someone present for this task when returning home secondary to increased fall risk. OT also educated pt that TLSO must be donned until he is seated on shower chair and must be donned again before exiting the shower. Pt verbalized understanding. Pt propelled wheelchair back to room with supervision and transferred back to bed in same manner. Pt calling for pain medication again at this time. Pt returning to supine in bed with call bell and all needed items within reach upon exiting the room.   Therapy Documentation Precautions:  Precautions Precautions: Fall, Back Required Braces or Orthoses: Spinal Brace Spinal  Brace: Thoracolumbosacral orthotic, Applied in sitting position Restrictions Weight Bearing Restrictions: No Vital Signs: Therapy Vitals Temp: 98.9 F (37.2 C) Temp Source: Oral Pulse Rate: 86 Resp: 18 BP: (!) 139/111 Patient Position (if appropriate): Sitting Oxygen Therapy SpO2: 98 % O2 Device: Not Delivered Pain: Pain Assessment Pain Assessment: 0-10 Pain Score: 5  Pain Type: Acute pain Pain Location: Back Pain Orientation: Right;Left Pain Onset: On-going Patients Stated Pain Goal: 3 Pain Intervention(s): Medication (See eMAR) ADL: ADL ADL Comments: please see functional navigator for ADL status Other Treatments:    See Function Navigator for Current Functional Status.   Therapy/Group: Individual Therapy  [name] 02/02/2016, 4:13 PM

## 2016-02-02 NOTE — Progress Notes (Addendum)
Physical Therapy Session Note  Patient Details  Name: Russell Gonzales MRN: [id number] Date of Birth: 05-23-1951  Today's Date: 02/02/2016 PT Individual Time: 1005-1105 PT Individual Time Calculation (min): 60 min    Short Term Goals: Week 1:  PT Short Term Goal 1 (Week 1): = LTGs due to anticipated LOS  Skilled Therapeutic Interventions/Progress Updates:    Pt received resting in bed, c/o soreness but does not rate, agreeable to therapy session. Session focus on activity tolerance, gait, posture, stairs, and functional mobility while maintaining back precautions.    Pt transitioned supine<>sit via log roll mod I with bed rails and PT donned/doffed brace total assist.  PT applied tape to brace to keep pads in place to protect pt from bruising due to ill fitting brace; [employer] also notified and state that they will send someone to assess.  Pt propels w/c throughout session, max distance 300' mod I.  Pt moved clothes from washer to dryer using reacher with min verbal cues for stepping closer to the machine.    Pt transfers throughout session mod I with RW for sit<>stand and stand/pivot.  Supervision for ambulatory transfers due to pt's crouched posture with fatigue.   Gait training 2x150' with RW and supervision with occasional verbal cues for upright posture.  Stair training x12 steps with 2 rail with supervision for alternating pattern.    Pt able to review HEP provided yesterday without cues.  PT also discussed potential upcoming d/c and conference occurring today.    Pt returned to room at end of session and positioned self in bed mod I.  Call bell in reach and needs met.   Therapy Documentation Precautions:  Precautions Precautions: Fall, Back Required Braces or Orthoses: Spinal Brace Spinal Brace: Thoracolumbosacral orthotic, Applied in sitting position Restrictions Weight Bearing Restrictions: No   See Function Navigator for Current Functional Status.   Therapy/Group:  Individual Therapy  Earnest Conroy Penven-Crew 02/02/2016, 12:07 PM

## 2016-02-02 NOTE — Progress Notes (Signed)
Occupational Therapy Session Note  Patient Details  Name: Russell Gonzales MRN: [id number] Date of Birth: 1951/04/07  Today's Date: 02/02/2016 OT Individual Time: [id number] and 1130-1200 OT Individual Time Calculation (min): 71 min and 30 min     Short Term Goals: Week 1:  OT Short Term Goal 1 (Week 1): Pt will complete LB dressing with Min A with use of AE OT Short Term Goal 2 (Week 1): Spouse [name] will don pts back orthosis with supervision  OT Short Term Goal 3 (Week 1): Pt will complete toileting with Min A OT Short Term Goal 4 (Week 1): Pt will recall 3/3 back precautions during ADL session   Skilled Therapeutic Interventions/Progress Updates:    Session 1: Upon entering the room, pt with 5/10 c/o pain in lower back this session. Pt supine in bed and performs log roll to EOB with increased time. Pt declined bathing and dressing but requesting to perform grooming tasks at sink. OT assisted pt with donning of TLSO brace in sitting. Pt ambulated to sink with RW with supervision for grooming tasks at sink with sit <>stand for tasks. Pt utilized reacher to ambulate and obtain clothing items for laundry tasks. Pt seated in wheelchair and propelled self 100' towards laundry room at mod I. Pt ambulated 20' into laundry room with RW and utilized reacher to place items in washing machine with supervision. Pt returning to wheelchair and propelling self back to room at end of session.   Session 2: Upon entering the room, pt supine in bed with 6/10 c/o pain described as being in L rib cage area. Pt also verbalizing he felt very fatigued today. OT placed TLSO brace on pt with total A. OT educated pt on energy conservation for laundry tasks. Pt seated on EOB to fold clothing items and then placed them back into bag that he held while ambulating to dresser to place items. Pt bending at the knees in order to place items into dresser drawer. Pt then ambulating into bathroom where he stood to void urine with  supervision only. Pt returning to sit on EOB with call bell and all needed items within reach. Lunch tray placed in front of pt upon exiting the room.   Therapy Documentation Precautions:  Precautions Precautions: Fall, Back Required Braces or Orthoses: Spinal Brace Spinal Brace: Thoracolumbosacral orthotic, Applied in sitting position Restrictions Weight Bearing Restrictions: No General:   Vital Signs: Therapy Vitals Temp: 98 F (36.7 C) Temp Source: Oral Pulse Rate: 90 Resp: 20 BP: (!) 147/80 Patient Position (if appropriate): Lying Oxygen Therapy SpO2: 97 % O2 Device: Not Delivered Pain: Pain Assessment Pain Assessment: 0-10 Pain Score: 6  Pain Type: Acute pain Pain Location: Back Pain Orientation: Mid Pain Descriptors / Indicators: Aching Pain Frequency: Constant Pain Onset: On-going Patients Stated Pain Goal: 2 Pain Intervention(s): Medication (See eMAR) ADL: ADL ADL Comments: please see functional navigator for ADL status  See Function Navigator for Current Functional Status.   Therapy/Group: Individual Therapy  [name] 02/02/2016, 9:31 AM

## 2016-02-03 ENCOUNTER — Inpatient Hospital Stay (HOSPITAL_COMMUNITY): Payer: PPO | Admitting: Physical Therapy

## 2016-02-03 ENCOUNTER — Inpatient Hospital Stay (HOSPITAL_COMMUNITY): Payer: PPO | Admitting: Occupational Therapy

## 2016-02-03 DIAGNOSIS — M5410 Radiculopathy, site unspecified: Secondary | ICD-10-CM

## 2016-02-03 LAB — GLUCOSE, CAPILLARY
GLUCOSE-CAPILLARY: 105 mg/dL — AB (ref 65–99)
GLUCOSE-CAPILLARY: 150 mg/dL — AB (ref 65–99)
Glucose-Capillary: 87 mg/dL (ref 65–99)
Glucose-Capillary: 124 mg/dL — ABNORMAL HIGH (ref 65–99)

## 2016-02-03 MED ORDER — FENTANYL 12 MCG/HR TD PT72
25.0000 ug | MEDICATED_PATCH | TRANSDERMAL | Status: DC
  Administered 2016-02-03: 25 ug via TRANSDERMAL
  Filled 2016-02-03: qty 2

## 2016-02-03 MED ORDER — ATORVASTATIN CALCIUM 20 MG PO TABS: 20.0000 mg | ORAL_TABLET | Freq: Every day | ORAL | 0 refills | Status: AC

## 2016-02-03 MED ORDER — METHOCARBAMOL 750 MG PO TABS: 750.0000 mg | ORAL_TABLET | Freq: Four times a day (QID) | ORAL | 0 refills | Status: DC | PRN

## 2016-02-03 MED ORDER — TRAMADOL HCL 50 MG PO TABS: 50.0000 mg | ORAL_TABLET | Freq: Four times a day (QID) | ORAL | 0 refills | Status: DC | PRN

## 2016-02-03 MED ORDER — GABAPENTIN 600 MG PO TABS
600.0000 mg | ORAL_TABLET | Freq: Three times a day (TID) | ORAL | Status: DC
  Administered 2016-02-03 – 2016-02-04 (×5): 600 mg via ORAL
  Filled 2016-02-03 (×5): qty 1

## 2016-02-03 MED ORDER — FENTANYL 25 MCG/HR TD PT72: 25.0000 ug | MEDICATED_PATCH | TRANSDERMAL | 0 refills | Status: AC

## 2016-02-03 MED ORDER — FAMOTIDINE 20 MG PO TABS: 20.0000 mg | ORAL_TABLET | Freq: Two times a day (BID) | ORAL | 0 refills | Status: AC

## 2016-02-03 MED ORDER — POLYETHYLENE GLYCOL 3350 17 G PO PACK: 17.0000 g | PACK | Freq: Two times a day (BID) | ORAL | 0 refills | Status: AC

## 2016-02-03 MED ORDER — GABAPENTIN 600 MG PO TABS: 600.0000 mg | ORAL_TABLET | Freq: Three times a day (TID) | ORAL | 1 refills | Status: AC

## 2016-02-03 NOTE — Progress Notes (Signed)
Occupational Therapy Session Note  Patient Details  Name: Russell Gonzales MRN: [id number] Date of Birth: 1951-01-28  Today's Date: 02/03/2016 OT Individual Time: 1132-1202 OT Individual Time Calculation (min): 30 min     Short Term Goals: Week 1:  OT Short Term Goal 1 (Week 1): Pt will complete LB dressing with Min A with use of AE OT Short Term Goal 2 (Week 1): Spouse [name] will don pts back orthosis with supervision  OT Short Term Goal 3 (Week 1): Pt will complete toileting with Min A OT Short Term Goal 4 (Week 1): Pt will recall 3/3 back precautions during ADL session   Skilled Therapeutic Interventions/Progress Updates:    Treatment session with focus on functional mobility and safety in home environment.  Pt in bed upon arrival reporting pain in Lt side.  Ambulated to toilet with RW and supervision, completing urination in standing without assist.  In ADL apt engaged in functional mobility and education on strategies to increase safety when reaching outside BOS and transporting items.  Issued walker bag to pt to increase safety and ensure proper hand placement on RW while transporting items.  Pt activity limited due to pain.  Returned to room via w/c and left supported upright in bed.  Therapy Documentation Precautions:  Precautions Precautions: Fall, Back Required Braces or Orthoses: Spinal Brace Spinal Brace: Thoracolumbosacral orthotic, Applied in sitting position Restrictions Weight Bearing Restrictions: No Pain: Pain Assessment Pain Assessment: 0-10 Pain Score: 4  Pain Location: Back Pain Orientation: Left Pain Descriptors / Indicators: Aching Pain Frequency: Constant Pain Onset: On-going Patients Stated Pain Goal: 3 Pain Intervention(s): Medication (See eMAR)  See Function Navigator for Current Functional Status.   Therapy/Group: Individual Therapy  Simonne Come 02/03/2016, 12:06 PM

## 2016-02-03 NOTE — Progress Notes (Signed)
Subjective/Complaints: Pt sitting up in bed this AM.  He states he woke up with a headache this morning is worried about it.  He also notes, when asked, about his rib pain that it is there and he doesn't know why it hasn't been addressed, although this has been a repeated discussion since the weekend.    ROS- +H/A. Left rib pain.  denies CP, SOB, N/V/D  Objective: Vital Signs: Blood pressure 138/84, pulse 99, temperature 98 F (36.7 C), temperature source Oral, resp. rate 18, height _0  (1.676 m), weight 113.2 kg (249 lb 8 oz), SpO2 97 %. No results found. Results for orders placed or performed during the hospital encounter of 01/28/16 (from the past 72 hour(s))  Glucose, capillary     Status: None   Collection Time: 01/31/16 11:58 AM  Result Value Ref Range   Glucose-Capillary 98 65 - 99 mg/dL  Glucose, capillary     Status: Abnormal   Collection Time: 01/31/16  5:11 PM  Result Value Ref Range   Glucose-Capillary 112 (H) 65 - 99 mg/dL  Glucose, capillary     Status: Abnormal   Collection Time: 01/31/16  8:36 PM  Result Value Ref Range   Glucose-Capillary 104 (H) 65 - 99 mg/dL   Comment 1 Notify RN   Glucose, capillary     Status: None   Collection Time: 02/01/16  6:55 AM  Result Value Ref Range   Glucose-Capillary 94 65 - 99 mg/dL  Glucose, capillary     Status: Abnormal   Collection Time: 02/01/16 11:22 AM  Result Value Ref Range   Glucose-Capillary 108 (H) 65 - 99 mg/dL  Glucose, capillary     Status: Abnormal   Collection Time: 02/01/16  4:37 PM  Result Value Ref Range   Glucose-Capillary 106 (H) 65 - 99 mg/dL  Glucose, capillary     Status: Abnormal   Collection Time: 02/01/16  8:51 PM  Result Value Ref Range   Glucose-Capillary 159 (H) 65 - 99 mg/dL   Comment 1 Notify RN   Basic metabolic panel     Status: Abnormal   Collection Time: 02/02/16  5:38 AM  Result Value Ref Range   Sodium 134 (L) 135 - 145 mmol/L   Potassium 4.1 3.5 - 5.1 mmol/L   Chloride 95 (L) 101  - 111 mmol/L   CO2 32 22 - 32 mmol/L   Glucose, Bld 107 (H) 65 - 99 mg/dL   BUN 14 6 - 20 mg/dL   Creatinine, Ser 1.83 (H) 0.61 - 1.24 mg/dL   Calcium 8.6 (L) 8.9 - 10.3 mg/dL   GFR calc non Af Amer 37 (L) >60 mL/min   GFR calc Af Amer 43 (L) >60 mL/min    Comment: (NOTE) The eGFR has been calculated using the CKD EPI equation. This calculation has not been validated in all clinical situations. eGFR's persistently <60 mL/min signify possible Chronic Kidney Disease.    Anion gap 7 5 - 15  CBC with Differential/Platelet     Status: Abnormal   Collection Time: 02/02/16  5:38 AM  Result Value Ref Range   WBC 4.7 4.0 - 10.5 K/uL   RBC 2.42 (L) 4.22 - 5.81 MIL/uL   Hemoglobin 7.4 (L) 13.0 - 17.0 g/dL   HCT 22.6 (L) 39.0 - 52.0 %   MCV 93.4 78.0 - 100.0 fL   MCH 30.6 26.0 - 34.0 pg   MCHC 32.7 30.0 - 36.0 g/dL   RDW 12.7 11.5 - 15.5 %  Platelets 425 (H) 150 - 400 K/uL   Neutrophils Relative % 56 %   Neutro Abs 2.6 1.7 - 7.7 K/uL   Lymphocytes Relative 28 %   Lymphs Abs 1.3 0.7 - 4.0 K/uL   Monocytes Relative 13 %   Monocytes Absolute 0.6 0.1 - 1.0 K/uL   Eosinophils Relative 3 %   Eosinophils Absolute 0.2 0.0 - 0.7 K/uL   Basophils Relative 0 %   Basophils Absolute 0.0 0.0 - 0.1 K/uL  Glucose, capillary     Status: Abnormal   Collection Time: 02/02/16  6:37 AM  Result Value Ref Range   Glucose-Capillary 109 (H) 65 - 99 mg/dL  Glucose, capillary     Status: Abnormal   Collection Time: 02/02/16 11:27 AM  Result Value Ref Range   Glucose-Capillary 107 (H) 65 - 99 mg/dL  Glucose, capillary     Status: None   Collection Time: 02/02/16  4:14 PM  Result Value Ref Range   Glucose-Capillary 81 65 - 99 mg/dL  Glucose, capillary     Status: Abnormal   Collection Time: 02/02/16  8:26 PM  Result Value Ref Range   Glucose-Capillary 188 (H) 65 - 99 mg/dL   Comment 1 Notify RN   Glucose, capillary     Status: Abnormal   Collection Time: 02/03/16  6:36 AM  Result Value Ref Range    Glucose-Capillary 105 (H) 65 - 99 mg/dL   Comment 1 Notify RN     Gen NAD. Vital signs reviewed. Well-developed.  HEENT: normocephalic, atraumatic.  Cardio: RRR and no murmur Resp: CTA B/L and unlabored GI: BS positive, soft, nontender Musc/Skel:  No edema, no tenderness Neuro: Alert and Oriented  Motor 5/5 in BUE proximal to distal B/l LE: 4+/5 hip flexion, 5/5 knee extension, ankle dorsi/plantar flexion Skin:   Wound healed. Seborrhea on face.   Assessment/Plan: 1. Functional deficits secondary to Thoracic myelopathy with paraparesis which require 3+ hours per day of interdisciplinary therapy in a comprehensive inpatient rehab setting. Physiatrist is providing close team supervision and 24 hour management of active medical problems listed below. Physiatrist and rehab team continue to assess barriers to discharge/monitor patient progress toward functional and medical goals. FIM: Function - Bathing Position: Shower Body parts bathed by patient: Right arm, Left arm, Chest, Abdomen, Front perineal area, Right upper leg, Left upper leg, Right lower leg, Left lower leg, Back, Buttocks Body parts bathed by helper: Buttocks, Back Assist Level: Touching or steadying assistance(Pt > 75%)  Function- Upper Body Dressing/Undressing What is the patient wearing?: Pull over shirt/dress Pull over shirt/dress - Perfomed by patient: Thread/unthread right sleeve, Thread/unthread left sleeve, Put head through opening, Pull shirt over trunk Pull over shirt/dress - Perfomed by helper: Pull shirt over trunk Assist Level: Set up Set up : To obtain clothing/put away Function - Lower Body Dressing/Undressing What is the patient wearing?: Pants, Non-skid slipper socks Position: Sitting EOB Pants- Performed by patient: Thread/unthread right pants leg, Thread/unthread left pants leg, Pull pants up/down, Fasten/unfasten pants Pants- Performed by helper: Thread/unthread right pants leg, Thread/unthread left  pants leg, Pull pants up/down Non-skid slipper socks- Performed by helper: Don/doff right sock, Don/doff left sock Assist for footwear: Dependant Assist for lower body dressing: Touching or steadying assistance (Pt > 75%)  Function - Toileting Toileting steps completed by patient: Adjust clothing prior to toileting, Performs perineal hygiene, Adjust clothing after toileting Toileting steps completed by helper: Adjust clothing prior to toileting, Performs perineal hygiene, Adjust clothing after toileting Toileting Assistive  Devices: Grab bar or rail Assist level: Touching or steadying assistance (Pt.75%)  Function - Air cabin crew transfer assistive device: Walker, Grab bar, Elevated toilet seat/BSC over toilet Assist level to toilet: Touching or steadying assistance (Pt > 75%) Assist level from toilet: Touching or steadying assistance (Pt > 75%)  Function - Chair/bed transfer Chair/bed transfer method: Stand pivot, Ambulatory Chair/bed transfer assist level: Supervision or verbal cues Chair/bed transfer assistive device: Armrests, Walker Chair/bed transfer details: Verbal cues for precautions/safety  Function - Locomotion: Wheelchair Will patient use wheelchair at discharge?: No Type: Manual Max wheelchair distance: 200 Assist Level: No help, No cues, assistive device, takes more than reasonable amount of time Assist Level: No help, No cues, assistive device, takes more than reasonable amount of time Assist Level: No help, No cues, assistive device, takes more than reasonable amount of time Turns around,maneuvers to table,bed, and toilet,negotiates 3% grade,maneuvers on rugs and over doorsills: No Function - Locomotion: Ambulation Assistive device: Walker-rolling Max distance: 150 Assist level: Supervision or verbal cues Assist level: Supervision or verbal cues Walk 50 feet with 2 turns activity did not occur: Safety/medical concerns (fatigue) Assist level: Supervision or  verbal cues Walk 150 feet activity did not occur: Safety/medical concerns (fatigue) Walk 10 feet on uneven surfaces activity did not occur: Safety/medical concerns (fatigue)  Function - Comprehension Comprehension: Auditory Comprehension assist level: Follows complex conversation/direction with no assist  Function - Expression Expression: Verbal Expression assist level: Expresses complex ideas: With no assist  Function - Social Interaction Social Interaction assist level: Interacts appropriately with others with medication or extra time (anti-anxiety, antidepressant).  Function - Problem Solving Problem solving assist level: Solves complex 90% of the time/cues < 10% of the time  Function - Memory Memory assist level: Complete Independence: No helper Patient normally able to recall (first 3 days only): Current season, Location of own room, Staff names and faces, That he or she is in a hospital  Medical Problem List and Plan: 1.  Functional and mobility deficits  secondary to thoracic myelopathy s/pt T10-L1 decompression   Cont CIR 2.  DVT Prophylaxis/Anticoagulation: Pharmaceutical: Lovenox 53m daily 3. Pain Management:   Fentanyl patch to 569m, decreased to 25 on 8/24  Gabapentin 300 TID started on 8/23, increased to 600 on 8/24  -continue oxycodone for breakthrough pain 4. Mood: team to provide ego support. no depression on anxiety noted  5. Neuropsych: This patient is capable of making decisions on his own behalf. 6. Skin/Wound Care: local care to back incisions - healed 7. Fluids/Electrolytes/Nutrition: encourage PO.   Check electrolytes upon admit  -poor appetite at present 8. Constipation/neurogenic bowel: augment bowel regimen given myelopathy/narcotics 9. AKI  Cr. 1.83 on 8/23 (improving)  Meds reviewed, no nephrotoxic meds notes  Will cont to monitor 10. ABLA  Hb. 7.4 on 8/23  Will cont to monitor  Labs ordered for tomorrow 11. Morbid Obesity  Body mass index  is 40.27 kg/m.  Diet and exercise education  Will cont to encourage weight loss to increase endurance and promote overall health 12. Seborrhea  On face, chronic  Cont to monitor 13. Left radicular rib pain  See pain 14. Mild hyponatremia  Na 134 on 8/23  LOS (Days) 6 A FACE TO FACE EVALUATION WAS PERFORMED  Gabreille Dardis AnLorie Phenix/24/2017, 8:04 AM

## 2016-02-03 NOTE — Patient Care Conference (Signed)
Inpatient RehabilitationTeam Conference and Plan of Care Update Date: 02/02/2016   Time: 2:30 PM    Patient Name: Russell Gonzales      Medical Record Number: [id number]  Date of Birth: 29-Nov-1950 Sex: Male         Room/Bed: 4M02C/4M02C-01 Payor Info: Payor: [employer] / Plan: [employer] / Product Type: *No Product type* /    Admitting Diagnosis: Thoracic Myelopathy  Admit Date/Time:  01/28/2016  3:27 PM Admission Comments: No comment available   Primary Diagnosis:  Thoracic myelopathy Principal Problem: Thoracic myelopathy  Patient Active Problem List   Diagnosis Date Noted  . Radicular pain   . Neuropathic pain   . Hyponatremia   . Seborrhea   . Rib pain on left side   . AKI (acute kidney injury) ([place])   . Acute blood loss anemia   . Post-operative pain   . Morbid obesity due to excess calories ([place])   . Encounter for central line placement   . Acute respiratory failure ([place])   . Encounter for intubation   . S/P lumbar fusion 01/18/2016  . 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 Myelopathy ([place]) 01/09/2016  . Back pain 01/09/2016  . Thoracic stenosis 08/11/2015  . 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 Other and unspecified hyperlipidemia 03/11/2013  . Essential hypertension 09/01/2009  . DIZZINESS 09/01/2009  . CHEST PAIN UNSPECIFIED 09/01/2009    Expected Discharge Date: Expected Discharge Date: 02/04/16  Team Members Present: Physician leading conference: Dr. Delice Lesch Social Worker Present: Lennart Pall, LCSW Nurse Present: Dorthula Nettles, RN PT Present: Dwyane Dee, PT OT Present: Benay Pillow, OT PPS Coordinator present : Daiva Nakayama, RN, CRRN     Current Status/Progress Goal Weekly Team Focus  Medical   Functional and mobility deficits  secondary to thoracic myelopathy s/pt T10-L1 decompression  Improve pain, renal function, Hb, electrolytes, sodium, left rib pain  See above   Bowel/Bladder   cont x2; LBM 8-22  Remain cont  x2 while on Rehab   Continue with regular bowel movements   Swallow/Nutrition/ Hydration             ADL's   supervision overall  supervision overall  pt education, safety, and discharge planning, self care retraining with AE, functional transfers and balance   Mobility   supervision/mod I overall  mod I overall, supervision for car and stairs  endurance, d/c planning    Communication             Safety/Cognition/ Behavioral Observations            Pain   Scheduled tramadol; PRN oxy and PRN robaxin  < 4 on 0-10 pain scale  Requesting use of Tens unit?    Skin   Incision healed, numbness about one inch in all directions around incisions  No new skin breakdown while on Rehab.   Continue monitoring skin q shift    Rehab Goals Patient on target to meet rehab goals: Yes *See Care Plan and progress notes for long and short-term goals.  Barriers to Discharge: Improve safety, mobility, endurance, chronic pain, AKI, ABLA, hyponatremia, left rib pain    Possible Resolutions to Barriers:  Therapies, follow labs, optimize pain management, while weaning medications.    Discharge Planning/Teaching Needs:  Plan to d/c home with wife who works f/t so only evening/ intermittent assist available.      Team Discussion:  Pain issues, AKI.  Plan to decrease pain meds.  Poorly fitting brace - [employer]  fixing this today.  Doing well with mobility, ADLs.  Currently superivision and on track for mod ind goals.  Revisions to Treatment Plan:  None   Continued Need for Acute Rehabilitation Level of Care: The patient requires daily medical management by a physician with specialized training in physical medicine and rehabilitation for the following conditions: Daily direction of a multidisciplinary physical rehabilitation program to ensure safe treatment while eliciting the highest outcome that is of practical value to the patient.: Yes Daily medical management of patient stability for increased activity  during participation in an intensive rehabilitation regime.: Yes Daily analysis of laboratory values and/or radiology reports with any subsequent need for medication adjustment of medical intervention for : Post surgical problems;Renal problems;Other  Lonza Shimabukuro 02/03/2016, 12:01 PM

## 2016-02-03 NOTE — Progress Notes (Signed)
Physical Therapy Discharge Summary  Patient Details  Name: Russell Gonzales MRN: [id number] Date of Birth: 1951/03/17  Today's Date: 02/03/2016 PT Individual Time: 1400-1500 and 1545-1630 PT Individual Time Calculation (min): 60 min and 45 min     Patient has met 8 of 8 long term goals due to improved activity tolerance, improved balance, improved postural control, increased strength, decreased pain and ability to compensate for deficits.  Patient to discharge at an ambulatory level Modified Independent.    Recommendation:  Patient will benefit from ongoing skilled PT services in home health setting to continue to advance safe functional mobility, address ongoing impairments in balance, strength, and endurance, and minimize fall risk.  Equipment: No equipment provided  Reasons for discharge: treatment goals met  Patient/family agrees with progress made and goals achieved: Yes   Skilled Therapeutic Intervention: Session 1: Pt received resting in w/c with c/o 4/10 pain but declines intervention and agreeable to therapy session.  Session focus on gait training for activity tolerance, stair negotiation, car transfers, functional transfers, and functional activity while maintaining back precautions.    Pt performing car transfers, functional transfers, ambulation on non-compliant and compliant surfaces, ambulation up/down ramp, and stair negotiation mod I.  Pt performs toileting mod I including clothing management and hygiene.   PT administered TUG with pt averaging 22.33 seconds across 3 trials.  Pt prepared a cup of water and a cup of coffee in standing while maintaining back precautions, mod I.    Pt returned to room at end of session and positioned in recliner with call bell in reach and needs met.   Session 2:  Pt received in recliner, no c/o pain and agreeable to therapy session.  Pt amb to and from therapy gym mod I.  Pt performs 10 minutes at level 3 and 4 minutes at level 4 on  Nustep for overall activity tolerance and cardiopulmonary endurance.  Pt reports feeling hazy following therex, vitals assessed and WNL, feelings subsided with rest.  Pt demonstrates HEP x10 reps without cuing and without visual aid from handout.  (LAQ, minisquats, heel raises, hip abduction, and hamstring curls).  Pt returned to room at end of session and mod I sign placed outside of room.  Call bell in reach and needs met.   PT Discharge Precautions/RestrictionsPrecautions Precautions: Fall;Back Precaution Comments: pt able to recall 3/3 back precautions without cuing Required Braces or Orthoses: Spinal Brace Spinal Brace: Thoracolumbosacral orthotic;Applied in sitting position Restrictions Weight Bearing Restrictions: No Pain Pain Assessment Pain Assessment: 0-10 Pain Score: 4  Cognition Overall Cognitive Status: Within Functional Limits for tasks assessed Arousal/Alertness: Awake/alert Orientation Level: Oriented X4 Attention: Alternating Alternating Attention: Appears intact Safety/Judgment: Appears intact Sensation Sensation Light Touch: Appears Intact Coordination Gross Motor Movements are Fluid and Coordinated: Yes Fine Motor Movements are Fluid and Coordinated: Yes Motor  Motor Motor: Within Functional Limits  Mobility Bed Mobility Bed Mobility: Sit to Supine;Supine to Sit Supine to Sit: 6: Modified independent (Device/Increase time) Sit to Supine: 6: Modified independent (Device/Increase time) Transfers Transfers: Yes Sit to Stand: 6: Modified independent (Device/Increase time) Stand to Sit: 6: Modified independent (Device/Increase time) Locomotion  Ambulation Ambulation: Yes Ambulation/Gait Assistance: 6: Modified independent (Device/Increase time) Ambulation Distance (Feet): 150 Feet Assistive device: Rolling walker Gait Gait Pattern: Impaired Gait Pattern: Step-through pattern;Decreased stride length;Trunk flexed;Decreased stance time - left;Left flexed  knee in stance;Right flexed knee in stance (crouched posture) Gait velocity: decreased Stairs / Additional Locomotion Stairs: Yes Stairs Assistance: 6: Modified independent (Device/Increase time)  Stair Management Technique: Two rails Number of Stairs: 12 Ramp: 6: Modified independent Water quality scientist) Architect: Yes Wheelchair Assistance: 6: Modified independent (Device/Increase time) Environmental health practitioner: Both upper extremities Wheelchair Parts Management: Needs assistance (for leg rests due to back precautions) Distance: 150  Trunk/Postural Assessment  Cervical Assessment Cervical Assessment: Within Functional Limits Thoracic Assessment Thoracic Assessment: Within Functional Limits Lumbar Assessment Lumbar Assessment: Within Functional Limits Postural Control Postural Control: Within Functional Limits  Balance Balance Balance Assessed: Yes Static Standing Balance Static Standing - Balance Support: During functional activity;Right upper extremity supported;Left upper extremity supported;No upper extremity supported Static Standing - Level of Assistance: 6: Modified independent (Device/Increase time) Dynamic Standing Balance Dynamic Standing - Balance Support: During functional activity;Left upper extremity supported;Right upper extremity supported Dynamic Standing - Level of Assistance: 6: Modified independent (Device/Increase time) Extremity Assessment      RLE Assessment RLE Assessment: Within Functional Limits LLE Assessment LLE Assessment: Within Functional Limits   See Function Navigator for Current Functional Status.  Jinny Sweetland E Penven-Crew 02/03/2016, 4:58 PM

## 2016-02-03 NOTE — Progress Notes (Signed)
Social Work Patient ID: Russell Gonzales, male   DOB: 05-06-1951, 65 y.o.   MRN: [id number]   Met with pt and wife yesterday afternoon to review team conference.  Both aware and agreeable with targeted d/c 8/25 and mod independent goals.  No concerns.  Reviewed f/u services.  Ameilia Rattan, LCSW

## 2016-02-03 NOTE — Discharge Instructions (Signed)
Inpatient Rehab Discharge Instructions  TODDY WAUGH Discharge date and time: No discharge date for patient encounter.   Activities/Precautions/ Functional Status: Activity: Back brace when out of bed applied in sitting position Diet: diabetic diet Wound Care: keep wound clean and dry Functional status:  ___ No restrictions     ___ Walk up steps independently ___ 24/7 supervision/assistance   ___ Walk up steps with assistance ___ Intermittent supervision/assistance  _x__ Bathe/dress independently ___ Walk with walker     ___ Bathe/dress with assistance ___ Walk Independently    ___ Shower independently ___ Walk with assistance    ___ Shower with assistance ___ No alcohol     ___ Return to work/school ________      COMMUNITY REFERRALS UPON DISCHARGE:    Home Health:   PT     OT                       Agency:  [facility] Phone: [phone]         Special Instructions: No driving   My questions have been answered and I understand these instructions. I will adhere to these goals and the provided educational materials after my discharge from the hospital.  Patient/Caregiver Signature _______________________________ Date __________  Clinician Signature _______________________________________ Date __________  Please bring this form and your medication list with you to all your follow-up doctor's appointments.

## 2016-02-03 NOTE — Progress Notes (Signed)
Occupational Therapy Discharge Summary  Patient Details  Name: Russell Gonzales MRN: [id number] Date of Birth: 1951/01/31  Today's Date: 02/03/2016 OT Individual Time:  0900- 2641   58 minutes of skilled intervention    Patient has met 8 of 8 long term goals due to improved activity tolerance, improved balance, postural control and ability to compensate for deficits.  Patient to discharge at overall mod I - supervision level.    Reasons goals not met: all goals met  Recommendation:  Patient will benefit from ongoing skilled OT services in home health setting to continue to advance functional skills in the area of BADL and iADL.  Equipment: No equipment provided  Reasons for discharge: treatment goals met  Patient/family agrees with progress made and goals achieved: Yes   OT Intervention: Upon entering the room, pt supine in bed awaiting therapist with 6/10 c/o pain in back. RN notified this session. TLSO donned in sitting. Pt obtained all needed items for self care with use of RW at mod I level. Pt engaged in bathing at shower level and crossing legs in order to perform LB bathing and dressing without breaking precautions. Pt performed dressing tasks while seated on EOB with AE such as long handed reacher in order to increase I with LB self care. Pt transferred to wheelchair and performed grooming talks independently while seated in wheelchair at sink. Call bell and all needed items within reach upon exiting the room.   OT Discharge Precautions/Restrictions Precautions Precautions: Fall;Back Precaution Comments: pt able to recall 3/3 back precautions without cuing Required Braces or Orthoses: Spinal Brace Spinal Brace: Thoracolumbosacral orthotic;Applied in sitting position Restrictions Weight Bearing Restrictions: No   Pain Pain Assessment Pain Assessment: No/denies pain ADL ADL ADL Comments: please see functional navigator for ADL status Vision/Perception  Vision-  History Baseline Vision/History: No visual deficits Patient Visual Report: No change from baseline Vision- Assessment Vision Assessment?: No apparent visual deficits  Cognition Overall Cognitive Status: Within Functional Limits for tasks assessed Arousal/Alertness: Awake/alert Orientation Level: Oriented X4 Attention: Alternating Sensation Sensation Light Touch: Appears Intact Stereognosis: Appears Intact Hot/Cold: Appears Intact Proprioception: Appears Intact Coordination Gross Motor Movements are Fluid and Coordinated: Yes Fine Motor Movements are Fluid and Coordinated: Yes Motor  Motor Motor: Within Functional Limits Mobility  Bed Mobility Bed Mobility: Sit to Supine;Supine to Sit Supine to Sit: 6: Modified independent (Device/Increase time) Sit to Supine: 6: Modified independent (Device/Increase time) Transfers Sit to Stand: 6: Modified independent (Device/Increase time) Stand to Sit: 6: Modified independent (Device/Increase time)  Trunk/Postural Assessment  Cervical Assessment Cervical Assessment: Within Functional Limits Thoracic Assessment Thoracic Assessment: Within Functional Limits Lumbar Assessment Lumbar Assessment: Within Functional Limits Postural Control Postural Control: Within Functional Limits  Balance Balance Balance Assessed: Yes Static Standing Balance Static Standing - Balance Support: During functional activity;Right upper extremity supported;Left upper extremity supported;No upper extremity supported Static Standing - Level of Assistance: 6: Modified independent (Device/Increase time) Dynamic Standing Balance Dynamic Standing - Balance Support: During functional activity;Left upper extremity supported;Right upper extremity supported Dynamic Standing - Level of Assistance: 6: Modified independent (Device/Increase time) Extremity/Trunk Assessment RUE Assessment RUE Assessment: Within Functional Limits LUE Assessment LUE Assessment: Within  Functional Limits   See Function Navigator for Current Functional Status.  Russell Gonzales 02/03/2016, 10:47 PM

## 2016-02-03 NOTE — Progress Notes (Signed)
Social Work Patient ID: Russell Gonzales, male   DOB: September 25, 1950, 65 y.o.   MRN: [id number]   Have reviewed team conference with pt and spouse and both feel they are ready for d/c on Sat.  Husband completed family education today and denies any concerns.  We discussed follow up therapy as well as primary medical care and medication cost assist program.  Have placed pt in the [facility] medication assist program that will assist with a month supply.  Pt has seen an MD at [facility] in the past when she had insurance.  Concern about having to privately pay for MD now.  We discussed the [place] and pt/spouse wish to have this as her primary medical home.  Appointment made.  I have also spoken with [facility] financial counselor to arrange that Medicaid and SSD application be started here prior to d/c.  Will continue to follow.  Eldor Conaway, LCSW

## 2016-02-04 LAB — CBC WITH DIFFERENTIAL/PLATELET
BASOS ABS: 0 10*3/uL (ref 0.0–0.1)
BASOS PCT: 0 %
EOS ABS: 0.3 10*3/uL (ref 0.0–0.7)
Eosinophils Relative: 6 %
HCT: 23.9 % — ABNORMAL LOW (ref 39.0–52.0)
Hemoglobin: 7.8 g/dL — ABNORMAL LOW (ref 13.0–17.0)
Lymphocytes Relative: 41 %
Lymphs Abs: 1.7 10*3/uL (ref 0.7–4.0)
MCH: 31 pg (ref 26.0–34.0)
MCHC: 32.6 g/dL (ref 30.0–36.0)
MCV: 94.8 fL (ref 78.0–100.0)
MONO ABS: 0.5 10*3/uL (ref 0.1–1.0)
MONOS PCT: 12 %
Neutro Abs: 1.7 10*3/uL (ref 1.7–7.7)
Neutrophils Relative %: 41 %
PLATELETS: 439 10*3/uL — AB (ref 150–400)
RBC: 2.52 MIL/uL — ABNORMAL LOW (ref 4.22–5.81)
RDW: 13 % (ref 11.5–15.5)
WBC: 4.1 10*3/uL (ref 4.0–10.5)

## 2016-02-04 LAB — BASIC METABOLIC PANEL
ANION GAP: 10 (ref 5–15)
BUN: 12 mg/dL (ref 6–20)
CALCIUM: 8.8 mg/dL — AB (ref 8.9–10.3)
CO2: 31 mmol/L (ref 22–32)
CREATININE: 1.67 mg/dL — AB (ref 0.61–1.24)
Chloride: 94 mmol/L — ABNORMAL LOW (ref 101–111)
GFR, EST AFRICAN AMERICAN: 48 mL/min — AB (ref >60)
GFR, EST NON AFRICAN AMERICAN: 42 mL/min — AB (ref >60)
GLUCOSE: 88 mg/dL (ref 65–99)
Potassium: 3.9 mmol/L (ref 3.5–5.1)
Sodium: 135 mmol/L (ref 135–145)

## 2016-02-04 LAB — GLUCOSE, CAPILLARY
GLUCOSE-CAPILLARY: 86 mg/dL (ref 65–99)
Glucose-Capillary: 88 mg/dL (ref 65–99)

## 2016-02-04 NOTE — Progress Notes (Signed)
Social Work  Discharge Note  The overall goal for the admission was met for:   Discharge location: Yes - home with wife who can provide intermittent assistance  Length of Stay: Yes - 7 days  Discharge activity level: Yes - modified independent  Home/community participation: Yes  Services provided included: MD, RD, PT, OT, RN, TR, Pharmacy and [place]: Private Insurance: [employer]  Follow-up services arranged: Home Health: PT, OT via [place], DME: 20x18 lightweight w/c, cushion via [facility] and Patient/Family has no preference for HH/DME agencies  Comments (or additional information):  Patient/Family verbalized understanding of follow-up arrangements: Yes  Individual responsible for coordination of the follow-up plan: pt  Confirmed correct DME delivered: [facility] 02/04/2016    Chamari Cutbirth

## 2016-02-04 NOTE — Progress Notes (Signed)
Patient discharged to home accompanied by his wife. TLSO brace on and adjusted.

## 2016-02-04 NOTE — Progress Notes (Signed)
Subjective/Complaints: Pt sitting up in bed this AM.  He states his pain is better.  He is ready to go home.   ROS- Denies CP, SOB, N/V/D  Objective: Vital Signs: Blood pressure 137/89, pulse 81, temperature 98.2 F (36.8 C), temperature source Oral, resp. rate 20, height 5\' 6"  (1.676 m), weight 113.2 kg (249 lb 8 oz), SpO2 97 %. No results found. Results for orders placed or performed during the hospital encounter of 01/28/16 (from the past 72 hour(s))  Glucose, capillary     Status: Abnormal   Collection Time: 02/01/16 11:22 AM  Result Value Ref Range   Glucose-Capillary 108 (H) 65 - 99 mg/dL  Glucose, capillary     Status: Abnormal   Collection Time: 02/01/16  4:37 PM  Result Value Ref Range   Glucose-Capillary 106 (H) 65 - 99 mg/dL  Glucose, capillary     Status: Abnormal   Collection Time: 02/01/16  8:51 PM  Result Value Ref Range   Glucose-Capillary 159 (H) 65 - 99 mg/dL   Comment 1 Notify RN   Basic metabolic panel     Status: Abnormal   Collection Time: 02/02/16  5:38 AM  Result Value Ref Range   Sodium 134 (L) 135 - 145 mmol/L   Potassium 4.1 3.5 - 5.1 mmol/L   Chloride 95 (L) 101 - 111 mmol/L   CO2 32 22 - 32 mmol/L   Glucose, Bld 107 (H) 65 - 99 mg/dL   BUN 14 6 - 20 mg/dL   Creatinine, Ser 02/04/16 (H) 0.61 - 1.24 mg/dL   Calcium 8.6 (L) 8.9 - 10.3 mg/dL   GFR calc non Af Amer 37 (L) >60 mL/min   GFR calc Af Amer 43 (L) >60 mL/min    Comment: (NOTE) The eGFR has been calculated using the CKD EPI equation. This calculation has not been validated in all clinical situations. eGFR's persistently <60 mL/min signify possible Chronic Kidney Disease.    Anion gap 7 5 - 15  CBC with Differential/Platelet     Status: Abnormal   Collection Time: 02/02/16  5:38 AM  Result Value Ref Range   WBC 4.7 4.0 - 10.5 K/uL   RBC 2.42 (L) 4.22 - 5.81 MIL/uL   Hemoglobin 7.4 (L) 13.0 - 17.0 g/dL   HCT 02/04/16 (L) 75.0 - 51.8 %   MCV 93.4 78.0 - 100.0 fL   MCH 30.6 26.0 - 34.0 pg    MCHC 32.7 30.0 - 36.0 g/dL   RDW 33.5 82.5 - 18.9 %   Platelets 425 (H) 150 - 400 K/uL   Neutrophils Relative % 56 %   Neutro Abs 2.6 1.7 - 7.7 K/uL   Lymphocytes Relative 28 %   Lymphs Abs 1.3 0.7 - 4.0 K/uL   Monocytes Relative 13 %   Monocytes Absolute 0.6 0.1 - 1.0 K/uL   Eosinophils Relative 3 %   Eosinophils Absolute 0.2 0.0 - 0.7 K/uL   Basophils Relative 0 %   Basophils Absolute 0.0 0.0 - 0.1 K/uL  Glucose, capillary     Status: Abnormal   Collection Time: 02/02/16  6:37 AM  Result Value Ref Range   Glucose-Capillary 109 (H) 65 - 99 mg/dL  Glucose, capillary     Status: Abnormal   Collection Time: 02/02/16 11:27 AM  Result Value Ref Range   Glucose-Capillary 107 (H) 65 - 99 mg/dL  Glucose, capillary     Status: None   Collection Time: 02/02/16  4:14 PM  Result Value Ref Range  Glucose-Capillary 81 65 - 99 mg/dL  Glucose, capillary     Status: Abnormal   Collection Time: 02/02/16  8:26 PM  Result Value Ref Range   Glucose-Capillary 188 (H) 65 - 99 mg/dL   Comment 1 Notify RN   Glucose, capillary     Status: Abnormal   Collection Time: 02/03/16  6:36 AM  Result Value Ref Range   Glucose-Capillary 105 (H) 65 - 99 mg/dL   Comment 1 Notify RN   Glucose, capillary     Status: Abnormal   Collection Time: 02/03/16 11:21 AM  Result Value Ref Range   Glucose-Capillary 150 (H) 65 - 99 mg/dL  Glucose, capillary     Status: None   Collection Time: 02/03/16  4:41 PM  Result Value Ref Range   Glucose-Capillary 87 65 - 99 mg/dL  Glucose, capillary     Status: Abnormal   Collection Time: 02/03/16  8:41 PM  Result Value Ref Range   Glucose-Capillary 124 (H) 65 - 99 mg/dL   Comment 1 Notify RN   Basic metabolic panel     Status: Abnormal   Collection Time: 02/04/16  5:38 AM  Result Value Ref Range   Sodium 135 135 - 145 mmol/L   Potassium 3.9 3.5 - 5.1 mmol/L   Chloride 94 (L) 101 - 111 mmol/L   CO2 31 22 - 32 mmol/L   Glucose, Bld 88 65 - 99 mg/dL   BUN 12 6 - 20 mg/dL    Creatinine, Ser 1.67 (H) 0.61 - 1.24 mg/dL   Calcium 8.8 (L) 8.9 - 10.3 mg/dL   GFR calc non Af Amer 42 (L) >60 mL/min   GFR calc Af Amer 48 (L) >60 mL/min    Comment: (NOTE) The eGFR has been calculated using the CKD EPI equation. This calculation has not been validated in all clinical situations. eGFR's persistently <60 mL/min signify possible Chronic Kidney Disease.    Anion gap 10 5 - 15  CBC with Differential/Platelet     Status: Abnormal   Collection Time: 02/04/16  5:38 AM  Result Value Ref Range   WBC 4.1 4.0 - 10.5 K/uL   RBC 2.52 (L) 4.22 - 5.81 MIL/uL   Hemoglobin 7.8 (L) 13.0 - 17.0 g/dL   HCT 23.9 (L) 39.0 - 52.0 %   MCV 94.8 78.0 - 100.0 fL   MCH 31.0 26.0 - 34.0 pg   MCHC 32.6 30.0 - 36.0 g/dL   RDW 13.0 11.5 - 15.5 %   Platelets 439 (H) 150 - 400 K/uL   Neutrophils Relative % 41 %   Neutro Abs 1.7 1.7 - 7.7 K/uL   Lymphocytes Relative 41 %   Lymphs Abs 1.7 0.7 - 4.0 K/uL   Monocytes Relative 12 %   Monocytes Absolute 0.5 0.1 - 1.0 K/uL   Eosinophils Relative 6 %   Eosinophils Absolute 0.3 0.0 - 0.7 K/uL   Basophils Relative 0 %   Basophils Absolute 0.0 0.0 - 0.1 K/uL    Gen NAD. Vital signs reviewed. Well-developed.  HEENT: normocephalic, atraumatic.  Cardio: RRR and no murmur Resp: CTA B/L and unlabored GI: BS positive, soft, nontender Musc/Skel:  No edema, no tenderness Neuro: Alert and Oriented  Motor 5/5 in BUE proximal to distal B/l LE: 4+/5 hip flexion, 5/5 knee extension, ankle dorsi/plantar flexion (unchanged) Skin:   Wound healed. Seborrhea on face.   Assessment/Plan: 1. Functional deficits secondary to Thoracic myelopathy with paraparesis which require 3+ hours per day of interdisciplinary therapy  in a comprehensive inpatient rehab setting. Physiatrist is providing close team supervision and 24 hour management of active medical problems listed below. Physiatrist and rehab team continue to assess barriers to discharge/monitor patient  progress toward functional and medical goals. FIM: Function - Bathing Position: Shower Body parts bathed by patient: Right arm, Left arm, Chest, Abdomen, Front perineal area, Right upper leg, Left upper leg, Right lower leg, Left lower leg, Back, Buttocks Body parts bathed by helper: Buttocks, Back Assist Level: Supervision or verbal cues  Function- Upper Body Dressing/Undressing What is the patient wearing?: Pull over shirt/dress Pull over shirt/dress - Perfomed by patient: Thread/unthread right sleeve, Thread/unthread left sleeve, Put head through opening, Pull shirt over trunk Pull over shirt/dress - Perfomed by helper: Pull shirt over trunk Assist Level: More than reasonable time Set up : To obtain clothing/put away Function - Lower Body Dressing/Undressing What is the patient wearing?: Pants, Non-skid slipper socks Position: Sitting EOB Pants- Performed by patient: Thread/unthread right pants leg, Thread/unthread left pants leg, Pull pants up/down, Fasten/unfasten pants Pants- Performed by helper: Thread/unthread right pants leg, Thread/unthread left pants leg, Pull pants up/down Non-skid slipper socks- Performed by helper: Don/doff right sock, Don/doff left sock Assist for footwear: Supervision/touching assist Assist for lower body dressing: Supervision or verbal cues  Function - Toileting Toileting steps completed by patient: Adjust clothing prior to toileting, Performs perineal hygiene, Adjust clothing after toileting Toileting steps completed by helper: Adjust clothing prior to toileting, Performs perineal hygiene, Adjust clothing after toileting Toileting Assistive Devices: Grab bar or rail Assist level: More than reasonable time  Function - Air cabin crew transfer assistive device: Walker Assist level to toilet: No Help, no cues, assistive device, takes more than a reasonable amount of time Assist level from toilet: No Help, no cues, assistive device, takes more  than a reasonable amount of time  Function - Chair/bed transfer Chair/bed transfer method: Stand pivot, Ambulatory Chair/bed transfer assist level: No Help, no cues, assistive device, takes more than a reasonable amount of time Chair/bed transfer assistive device: Armrests, Walker, Orthosis Chair/bed transfer details: Verbal cues for precautions/safety  Function - Locomotion: Wheelchair Will patient use wheelchair at discharge?: No Type: Manual Max wheelchair distance: 150 Assist Level: No help, No cues, assistive device, takes more than reasonable amount of time Assist Level: No help, No cues, assistive device, takes more than reasonable amount of time Assist Level: No help, No cues, assistive device, takes more than reasonable amount of time Turns around,maneuvers to table,bed, and toilet,negotiates 3% grade,maneuvers on rugs and over doorsills: No Function - Locomotion: Ambulation Assistive device: Walker-rolling, Orthosis Max distance: 150 Assist level: No help, No cues, assistive device, takes more than a reasonable amount of time Assist level: No help, No cues, assistive device, takes more than a reasonable amount of time Walk 50 feet with 2 turns activity did not occur: Safety/medical concerns (fatigue) Assist level: No help, No cues, assistive device, takes more than a reasonable amount of time Walk 150 feet activity did not occur: Safety/medical concerns (fatigue) Assist level: No help, No cues, assistive device, takes more than a reasonable amount of time Walk 10 feet on uneven surfaces activity did not occur: Safety/medical concerns (fatigue) Assist level: No help, No cues, assistive device, takes more than a reasonable amount of time  Function - Comprehension Comprehension: Auditory Comprehension assist level: Follows complex conversation/direction with no assist  Function - Expression Expression: Verbal Expression assist level: Expresses complex ideas: With no  assist  Function - Social  Interaction Social Interaction assist level: Interacts appropriately with others with medication or extra time (anti-anxiety, antidepressant).  Function - Problem Solving Problem solving assist level: Solves complex 90% of the time/cues < 10% of the time  Function - Memory Memory assist level: Complete Independence: No helper Patient normally able to recall (first 3 days only): Current season, Location of own room, Staff names and faces, That he or she is in a hospital  Medical Problem List and Plan: 1.  Functional and mobility deficits  secondary to thoracic myelopathy s/pt T10-L1 decompression   D/c today 2.  DVT Prophylaxis/Anticoagulation: Pharmaceutical: Lovenox '40mg'$  daily 3. Pain Management:   Fentanyl patch to 21mg, decreased to 25 on 8/24  Gabapentin 300 TID started on 8/23, increased to 600 on 8/24  -Improved  -continue oxycodone for breakthrough pain 4. Mood: team to provide ego support. no depression on anxiety noted  5. Neuropsych: This patient is capable of making decisions on his own behalf. 6. Skin/Wound Care: local care to back incisions - healed 7. Fluids/Electrolytes/Nutrition: encourage PO.   Check electrolytes upon admit  -poor appetite at present 8. Constipation/neurogenic bowel: augment bowel regimen given myelopathy/narcotics 9. AKI  Cr. 1.67 on 8/24 (improving)  Meds reviewed, no nephrotoxic meds notes 10. ABLA  Hb. 7.8 on 8/24  Will cont to monitor 11. Morbid Obesity  Body mass index is 40.27 kg/m.  Diet and exercise education  Will cont to encourage weight loss to increase endurance and promote overall health 12. Seborrhea  On face, chronic  Cont to monitor 13. Left radicular rib pain  See pain 14. Mild hyponatremia  Na 135 on 8/24  LOS (Days) 7 A FACE TO FACE EVALUATION WAS PERFORMED  Jena Tegeler ALorie Phenix8/25/2017, 8:04 AM

## 2016-02-07 NOTE — Discharge Summary (Signed)
Physician Discharge Summary  Patient ID: Russell Gonzales MRN: [id number] DOB/AGE: 09-29-1950 65 y.o.  Admit date: 01/28/2016 Discharge date: 02/03/2016  Discharge Diagnoses:  Principal Problem:   Thoracic myelopathy Active Problems:   Essential hypertension   AKI (acute kidney injury) (English)   Acute blood loss anemia   Morbid obesity due to excess calories (HCC)   Seborrhea   Rib pain on left side   Neuropathic pain   Hyponatremia   Radicular pain   Discharged Condition: stable.   Significant Diagnostic Studies:   Labs:  Basic Metabolic Panel:  Recent Labs BMP Latest Ref Rng & Units 02/04/2016 02/02/2016 01/29/2016  Glucose 65 - 99 mg/dL 88 107(H) 103(H)  BUN 6 - 20 mg/dL 12 14 17   Creatinine 0.61 - 1.24 mg/dL 1.67(H) 1.83(H) 2.11(H)  Sodium 135 - 145 mmol/L 135 134(L) 135  Potassium 3.5 - 5.1 mmol/L 3.9 4.1 4.3  Chloride 101 - 111 mmol/L 94(L) 95(L) 95(L)  CO2 22 - 32 mmol/L 31 32 32  Calcium 8.9 - 10.3 mg/dL 8.8(L) 8.6(L) 8.6(L)    CBC:  Recent Labs CBC Latest Ref Rng & Units 02/04/2016 02/02/2016 01/31/2016  WBC 4.0 - 10.5 K/uL 4.1 4.7 4.5  Hemoglobin 13.0 - 17.0 g/dL 7.8(L) 7.4(L) 7.7(L)  Hematocrit 39.0 - 52.0 % 23.9(L) 22.6(L) 22.8(L)  Platelets 150 - 400 K/uL 439(H) 425(H) 372    CBG:  Recent Labs Lab 02/03/16 1121 02/03/16 1641 02/03/16 2041 02/04/16 0634 02/04/16 1138  GLUCAP 150* 87 124* 86 88    Brief HPI:   Russell Drown Jonesis a 65 y.o.malewith history of fatty liver, HTN, DOE, thoracic myelopathy requiring emergent decompression 08/2015 who started developing progressive dysesthesias and weakness in lower extremities over 10 -14 days with work up 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 He was admitted on 01/18/16 for decompressive lam T 10- L1 with by Dr. Ellene Route. Post op course complicated by ABLA--5 L blood loss as well as hypoxia and hypotension. PCCM consulted for management of shock, AKI, electrolyte abnormality and SIRS  reaction. He was extubated on 08/09 and transient hematuria has resolved. DIC panel negative.    Pain control had improved with improvement in mobility. CIR recommended by MD and rehab team for follow up therapy.    Hospital Course: Russell Gonzales was admitted to rehab 01/28/2016 for inpatient therapies to consist of PT, ST and OT at least three hours five days a week. Past admission physiatrist, therapy team and rehab RN have worked together to provide customized collaborative inpatient rehab. ABLA has been monitored with serial checks and has been stable. Blood pressures were monitored on bid basis and relatively controlled.  Po intake has been poor and nutritional supplement was added to help maintain adequate nutritional status. AKI is steadily resolving with Scr- 1.67 and hyponatremia has resolved.  He has been educated on appropriate diet as well as slowly improving activity level to work on weight loss to help promote overall health.  Back incision is healing well without s/s of infection.  Pain control has improved addition of gabapentin. This has slowly been titrated upwards and fentanyl was decreased to 25 mcg on 8/24.  He will continue to receive follow up HHPT and [place] by [place] after discharge.    Rehab course: During patient's stay in rehab weekly team conferences were held to monitor patient's progress, set goals and discuss barriers to discharge. At admission, patient required min assist with mobility and mod-max assist with basic ADLs. He has  had improvement in activity tolerance, balance, postural control, as well as ability to compensate for deficits. He is able to complete ADL tasks with AE at modified independent to supervision level. He is able to transfer and ambulate 150' with RW at modified independent level.     Disposition: 01-Home or Self Care  Diet: Regular.   Special Instructions: 1. Apply back brace in seated position.    Discharge Instructions     Ambulatory referral to Physical Medicine Rehab    Complete by:  As directed   Moderate complexity follow-up 1-2 weeks thoracic myelopathy       Medication List    STOP taking these medications   dexamethasone 4 MG tablet Commonly known as:  DECADRON   hydrochlorothiazide 12.5 MG capsule Commonly known as:  MICROZIDE   HYDROcodone-acetaminophen 5-325 MG tablet Commonly known as:  NORCO   ketoconazole 2 % cream Commonly known as:  NIZORAL   lisinopril 40 MG tablet Commonly known as:  PRINIVIL,ZESTRIL     TAKE these medications   atorvastatin 20 MG tablet Commonly known as:  LIPITOR Take 1 tablet (20 mg total) by mouth daily. What changed:  See the new instructions.   famotidine 20 MG tablet Commonly known as:  PEPCID Take 1 tablet (20 mg total) by mouth 2 (two) times daily.   fentaNYL 25 MCG/HR patch--Rx # 10 patches Commonly known as:  DURAGESIC - dosed mcg/hr Place 1 patch (25 mcg total) onto the skin every 3 (three) days.   fish oil-omega-3 fatty acids 1000 MG capsule Take 1 g by mouth 2 (two) times daily.   gabapentin 600 MG tablet Commonly known as:  NEURONTIN Take 1 tablet (600 mg total) by mouth 3 (three) times daily.   methocarbamol 750 MG tablet Commonly known as:  ROBAXIN Take 1 tablet (750 mg total) by mouth 4 (four) times daily as needed for muscle spasms.   MULTIVITAMIN ADULT PO Take 1 tablet by mouth daily.   polyethylene glycol packet Commonly known as:  MIRALAX / GLYCOLAX Take 17 g by mouth 2 (two) times daily.   traMADol 50 MG tablet--Rx # 90 pills Commonly known as:  ULTRAM Take 1 tablet (50 mg total) by mouth every 6 (six) hours as needed for moderate pain.      Follow-up Information    Ankit Lorie Phenix, MD .   Specialty:  Physical Medicine and Rehabilitation Contact information: [address] [place] [address] [phone]        Earleen Newport, MD .   Specialty:  Neurosurgery Why:  Call for appointment 2  weeks Contact information: [address] [place] [address] [phone]        Jenny Reichmann, MD .   Specialty:  Family Medicine Why:  Call for appointment as needed Contact information: [place] [id number]           Signed: Bary Leriche 02/09/2016, 3:51 PM

## 2016-02-11 ENCOUNTER — Inpatient Hospital Stay (HOSPITAL_COMMUNITY): Payer: PPO | Admitting: Anesthesiology

## 2016-02-11 ENCOUNTER — Encounter (HOSPITAL_COMMUNITY): Payer: Self-pay | Admitting: Vascular Surgery

## 2016-02-11 ENCOUNTER — Inpatient Hospital Stay (HOSPITAL_COMMUNITY)
Admission: EM | Disposition: A | Discharge status: Home Health | Payer: Self-pay | Source: Home / Self Care | Attending: Neurological Surgery

## 2016-02-11 ENCOUNTER — Ambulatory Visit (INDEPENDENT_AMBULATORY_CARE_PROVIDER_SITE_OTHER): Payer: PPO | Admitting: Emergency Medicine

## 2016-02-11 ENCOUNTER — Emergency Department (HOSPITAL_COMMUNITY): Payer: PPO

## 2016-02-11 ENCOUNTER — Inpatient Hospital Stay (HOSPITAL_COMMUNITY)
Admission: EM | Admit: 2016-02-11 | Discharge: 2016-02-16 | DRG: 496 | Disposition: A | Discharge status: Home Health | Payer: PPO | Attending: Neurological Surgery | Admitting: Neurological Surgery

## 2016-02-11 VITALS — BP 142/98 | HR 71 | Temp 97.7°F | Resp 20 | Wt 229.0 lb

## 2016-02-11 DIAGNOSIS — G8929 Other chronic pain: Secondary | ICD-10-CM | POA: Diagnosis present

## 2016-02-11 DIAGNOSIS — R109 Unspecified abdominal pain: Secondary | ICD-10-CM

## 2016-02-11 DIAGNOSIS — Z886 Allergy status to analgesic agent status: Secondary | ICD-10-CM

## 2016-02-11 DIAGNOSIS — E861 Hypovolemia: Secondary | ICD-10-CM | POA: Diagnosis present

## 2016-02-11 DIAGNOSIS — R112 Nausea with vomiting, unspecified: Secondary | ICD-10-CM | POA: Diagnosis not present

## 2016-02-11 DIAGNOSIS — I10 Essential (primary) hypertension: Secondary | ICD-10-CM | POA: Diagnosis present

## 2016-02-11 DIAGNOSIS — K219 Gastro-esophageal reflux disease without esophagitis: Secondary | ICD-10-CM | POA: Diagnosis present

## 2016-02-11 DIAGNOSIS — Z419 Encounter for procedure for purposes other than remedying health state, unspecified: Secondary | ICD-10-CM

## 2016-02-11 DIAGNOSIS — Z881 Allergy status to other antibiotic agents status: Secondary | ICD-10-CM

## 2016-02-11 DIAGNOSIS — R000 Tachycardia, unspecified: Secondary | ICD-10-CM | POA: Diagnosis not present

## 2016-02-11 DIAGNOSIS — R7303 Prediabetes: Secondary | ICD-10-CM | POA: Diagnosis present

## 2016-02-11 DIAGNOSIS — D649 Anemia, unspecified: Secondary | ICD-10-CM | POA: Diagnosis present

## 2016-02-11 DIAGNOSIS — E785 Hyperlipidemia, unspecified: Secondary | ICD-10-CM | POA: Diagnosis present

## 2016-02-11 DIAGNOSIS — Z87891 Personal history of nicotine dependence: Secondary | ICD-10-CM | POA: Diagnosis not present

## 2016-02-11 DIAGNOSIS — K5900 Constipation, unspecified: Secondary | ICD-10-CM | POA: Diagnosis present

## 2016-02-11 DIAGNOSIS — K760 Fatty (change of) liver, not elsewhere classified: Secondary | ICD-10-CM | POA: Diagnosis present

## 2016-02-11 DIAGNOSIS — Z981 Arthrodesis status: Secondary | ICD-10-CM

## 2016-02-11 DIAGNOSIS — Z91048 Other nonmedicinal substance allergy status: Secondary | ICD-10-CM | POA: Diagnosis not present

## 2016-02-11 DIAGNOSIS — D500 Iron deficiency anemia secondary to blood loss (chronic): Secondary | ICD-10-CM | POA: Diagnosis present

## 2016-02-11 DIAGNOSIS — T84226A Displacement of internal fixation device of vertebrae, initial encounter: Secondary | ICD-10-CM | POA: Diagnosis present

## 2016-02-11 DIAGNOSIS — Z9889 Other specified postprocedural states: Secondary | ICD-10-CM

## 2016-02-11 DIAGNOSIS — M545 Low back pain: Secondary | ICD-10-CM | POA: Diagnosis present

## 2016-02-11 DIAGNOSIS — M4714 Other spondylosis with myelopathy, thoracic region: Secondary | ICD-10-CM | POA: Diagnosis present

## 2016-02-11 DIAGNOSIS — M5104 Intervertebral disc disorders with myelopathy, thoracic region: Secondary | ICD-10-CM | POA: Diagnosis not present

## 2016-02-11 DIAGNOSIS — G8918 Other acute postprocedural pain: Secondary | ICD-10-CM

## 2016-02-11 DIAGNOSIS — M419 Scoliosis, unspecified: Secondary | ICD-10-CM | POA: Diagnosis present

## 2016-02-11 LAB — COMPREHENSIVE METABOLIC PANEL
ALBUMIN: 3.5 g/dL (ref 3.5–5.0)
ALT: 34 U/L (ref 17–63)
ANION GAP: 9 (ref 5–15)
AST: 30 U/L (ref 15–41)
Alkaline Phosphatase: 108 U/L (ref 38–126)
BILIRUBIN TOTAL: 0.6 mg/dL (ref 0.3–1.2)
BUN: 17 mg/dL (ref 6–20)
CHLORIDE: 102 mmol/L (ref 101–111)
CO2: 25 mmol/L (ref 22–32)
Calcium: 9.4 mg/dL (ref 8.9–10.3)
Creatinine, Ser: 1.32 mg/dL — ABNORMAL HIGH (ref 0.61–1.24)
GFR calc Af Amer: >60 mL/min (ref >60)
GFR calc non Af Amer: 55 mL/min — ABNORMAL LOW (ref >60)
GLUCOSE: 104 mg/dL — AB (ref 65–99)
POTASSIUM: 4.7 mmol/L (ref 3.5–5.1)
SODIUM: 136 mmol/L (ref 135–145)
Total Protein: 6.5 g/dL (ref 6.5–8.1)

## 2016-02-11 LAB — CBC
HEMATOCRIT: 29.4 % — AB (ref 39.0–52.0)
Hemoglobin: 9.7 g/dL — ABNORMAL LOW (ref 13.0–17.0)
MCH: 30.3 pg (ref 26.0–34.0)
MCHC: 33 g/dL (ref 30.0–36.0)
MCV: 91.9 fL (ref 78.0–100.0)
PLATELETS: 420 10*3/uL — AB (ref 150–400)
RBC: 3.2 MIL/uL — ABNORMAL LOW (ref 4.22–5.81)
RDW: 13.3 % (ref 11.5–15.5)
WBC: 6 10*3/uL (ref 4.0–10.5)

## 2016-02-11 LAB — URINALYSIS, ROUTINE W REFLEX MICROSCOPIC
Bilirubin Urine: NEGATIVE
GLUCOSE, UA: NEGATIVE mg/dL
HGB URINE DIPSTICK: NEGATIVE
KETONES UR: NEGATIVE mg/dL
LEUKOCYTES UA: NEGATIVE
Nitrite: NEGATIVE
PH: 8 (ref 5.0–8.0)
PROTEIN: NEGATIVE mg/dL
Specific Gravity, Urine: 1.013 (ref 1.005–1.030)

## 2016-02-11 LAB — I-STAT CG4 LACTIC ACID, ED: Lactic Acid, Venous: 1.68 mmol/L (ref 0.5–1.9)

## 2016-02-11 LAB — POCT URINALYSIS DIP (MANUAL ENTRY)
BILIRUBIN UA: NEGATIVE
GLUCOSE UA: NEGATIVE
Ketones, POC UA: NEGATIVE
Leukocytes, UA: NEGATIVE
NITRITE UA: NEGATIVE
Protein Ur, POC: 30 — AB
RBC UA: NEGATIVE
SPEC GRAV UA: 1.015
UROBILINOGEN UA: 0.2
pH, UA: 7

## 2016-02-11 LAB — POC MICROSCOPIC URINALYSIS (UMFC): MUCUS RE: ABSENT

## 2016-02-11 SURGERY — POSTERIOR LUMBAR FUSION 1 WITH HARDWARE REMOVAL
Anesthesia: General | Site: Back

## 2016-02-11 MED ORDER — ROCURONIUM BROMIDE 10 MG/ML (PF) SYRINGE
PREFILLED_SYRINGE | INTRAVENOUS | Status: AC
  Filled 2016-02-11: qty 10

## 2016-02-11 MED ORDER — ONDANSETRON 4 MG PO TBDP
8.0000 mg | ORAL_TABLET | Freq: Once | ORAL | Status: AC
  Administered 2016-02-11: 8 mg via ORAL

## 2016-02-11 MED ORDER — MIDAZOLAM HCL 5 MG/5ML IJ SOLN
INTRAMUSCULAR | Status: DC | PRN
  Administered 2016-02-11: 2 mg via INTRAVENOUS

## 2016-02-11 MED ORDER — ALBUMIN HUMAN 5 % IV SOLN
INTRAVENOUS | Status: DC | PRN
  Administered 2016-02-11: via INTRAVENOUS

## 2016-02-11 MED ORDER — SUCCINYLCHOLINE CHLORIDE 200 MG/10ML IV SOSY
PREFILLED_SYRINGE | INTRAVENOUS | Status: AC
  Filled 2016-02-11: qty 10

## 2016-02-11 MED ORDER — VANCOMYCIN HCL 1000 MG IV SOLR
INTRAVENOUS | Status: DC | PRN
  Administered 2016-02-11: 1000 mg via INTRAVENOUS

## 2016-02-11 MED ORDER — ROCURONIUM 10MG/ML (10ML) SYRINGE FOR MEDFUSION PUMP - OPTIME
INTRAVENOUS | Status: DC | PRN
  Administered 2016-02-11: 50 mg via INTRAVENOUS

## 2016-02-11 MED ORDER — LIDOCAINE 2% (20 MG/ML) 5 ML SYRINGE
INTRAMUSCULAR | Status: AC
Start: 2016-02-11 — End: 2016-02-11
  Filled 2016-02-11: qty 5

## 2016-02-11 MED ORDER — SUFENTANIL CITRATE 50 MCG/ML IV SOLN
INTRAVENOUS | Status: DC | PRN
  Administered 2016-02-11: 15 ug via INTRAVENOUS
  Administered 2016-02-11 (×2): 10 ug via INTRAVENOUS
  Administered 2016-02-12: 15 ug via INTRAVENOUS

## 2016-02-11 MED ORDER — MORPHINE SULFATE (PF) 4 MG/ML IV SOLN
4.0000 mg | Freq: Once | INTRAVENOUS | Status: AC
  Administered 2016-02-11: 4 mg via INTRAVENOUS
  Filled 2016-02-11: qty 1

## 2016-02-11 MED ORDER — LIDOCAINE HCL (CARDIAC) 20 MG/ML IV SOLN
INTRAVENOUS | Status: DC | PRN
  Administered 2016-02-11: 100 mg via INTRAVENOUS

## 2016-02-11 MED ORDER — LACTATED RINGERS IV SOLN
INTRAVENOUS | Status: DC | PRN
  Administered 2016-02-11 – 2016-02-12 (×3): via INTRAVENOUS

## 2016-02-11 MED ORDER — DEXAMETHASONE SODIUM PHOSPHATE 10 MG/ML IJ SOLN
INTRAMUSCULAR | Status: AC
  Filled 2016-02-11: qty 1

## 2016-02-11 MED ORDER — DEXAMETHASONE SODIUM PHOSPHATE 10 MG/ML IJ SOLN
INTRAMUSCULAR | Status: DC | PRN
  Administered 2016-02-11: 10 mg via INTRAVENOUS

## 2016-02-11 MED ORDER — HEPARIN SODIUM (PORCINE) 1000 UNIT/ML IJ SOLN
INTRAMUSCULAR | Status: DC
  Filled 2016-02-11: qty 30

## 2016-02-11 MED ORDER — PHENYLEPHRINE 40 MCG/ML (10ML) SYRINGE FOR IV PUSH (FOR BLOOD PRESSURE SUPPORT)
PREFILLED_SYRINGE | INTRAVENOUS | Status: AC
  Filled 2016-02-11: qty 10

## 2016-02-11 MED ORDER — SODIUM CHLORIDE 0.9 % IR SOLN
Status: DC | PRN
  Administered 2016-02-11: 500 mL

## 2016-02-11 MED ORDER — PROPOFOL 10 MG/ML IV BOLUS
INTRAVENOUS | Status: AC
  Filled 2016-02-11: qty 20

## 2016-02-11 MED ORDER — ONDANSETRON HCL 4 MG/2ML IJ SOLN
4.0000 mg | Freq: Once | INTRAMUSCULAR | Status: AC
  Administered 2016-02-11: 4 mg via INTRAVENOUS
  Filled 2016-02-11: qty 2

## 2016-02-11 MED ORDER — IOPAMIDOL (ISOVUE-300) INJECTION 61%
INTRAVENOUS | Status: AC
  Administered 2016-02-11: 100 mL
  Filled 2016-02-11: qty 100

## 2016-02-11 MED ORDER — SODIUM CHLORIDE 0.9 % IJ SOLN
INTRAMUSCULAR | Status: AC
Start: 2016-02-11 — End: 2016-02-11
  Filled 2016-02-11: qty 10

## 2016-02-11 MED ORDER — ONDANSETRON HCL 4 MG/2ML IJ SOLN
INTRAMUSCULAR | Status: AC
  Filled 2016-02-11: qty 2

## 2016-02-11 MED ORDER — THROMBIN 20000 UNITS EX SOLR
CUTANEOUS | Status: DC | PRN
  Administered 2016-02-11: 20 mL via TOPICAL

## 2016-02-11 MED ORDER — SUCCINYLCHOLINE CHLORIDE 20 MG/ML IJ SOLN
INTRAMUSCULAR | Status: DC | PRN
  Administered 2016-02-11: 120 mg via INTRAVENOUS

## 2016-02-11 MED ORDER — 0.9 % SODIUM CHLORIDE (POUR BTL) OPTIME
TOPICAL | Status: DC | PRN
  Administered 2016-02-11: 1000 mL

## 2016-02-11 MED ORDER — MIDAZOLAM HCL 2 MG/2ML IJ SOLN
INTRAMUSCULAR | Status: AC
  Filled 2016-02-11: qty 2

## 2016-02-11 MED ORDER — PHENYLEPHRINE HCL 10 MG/ML IJ SOLN
INTRAMUSCULAR | Status: DC | PRN
  Administered 2016-02-11: 80 ug via INTRAVENOUS
  Administered 2016-02-11: 160 ug via INTRAVENOUS
  Administered 2016-02-11 (×2): 80 ug via INTRAVENOUS

## 2016-02-11 MED ORDER — PROPOFOL 10 MG/ML IV BOLUS
INTRAVENOUS | Status: DC | PRN
  Administered 2016-02-11: 170 mg via INTRAVENOUS

## 2016-02-11 MED ORDER — VANCOMYCIN HCL IN DEXTROSE 1-5 GM/200ML-% IV SOLN
INTRAVENOUS | Status: AC
  Filled 2016-02-11: qty 200

## 2016-02-11 MED ORDER — SUFENTANIL CITRATE 50 MCG/ML IV SOLN
INTRAVENOUS | Status: AC
  Filled 2016-02-11: qty 1

## 2016-02-11 SURGICAL SUPPLY — 67 items
ADH SKN CLS APL DERMABOND .7 (GAUZE/BANDAGES/DRESSINGS) ×1
APL SRG 60D 8 XTD TIP BNDBL (TIP)
BAG DECANTER FOR FLEXI CONT (MISCELLANEOUS) ×3 IMPLANT
BLADE CLIPPER SURG (BLADE) IMPLANT
BUR MATCHSTICK NEURO 3.0 LAGG (BURR) ×3 IMPLANT
CANISTER SUCT 3000ML PPV (MISCELLANEOUS) ×3 IMPLANT
CONNECTOR RELINE O X  40-50MM (Connector) ×2 IMPLANT
CONNECTOR RELINE O X 40-50MM (Connector) IMPLANT
CONNECTOR RELINE ROTATE 5-6MM (Connector) ×3 IMPLANT
CONNECTOR RELINE ROTATING 5-6 (Connector) ×2 IMPLANT
CONT SPEC 4OZ CLIKSEAL STRL BL (MISCELLANEOUS) ×3 IMPLANT
COVER BACK TABLE 24X17X13 BIG (DRAPES) IMPLANT
COVER BACK TABLE 60X90IN (DRAPES) ×3 IMPLANT
DECANTER SPIKE VIAL GLASS SM (MISCELLANEOUS) ×3 IMPLANT
DERMABOND ADVANCED (GAUZE/BANDAGES/DRESSINGS) ×2
DERMABOND ADVANCED .7 DNX12 (GAUZE/BANDAGES/DRESSINGS) ×1 IMPLANT
DEVICE DISSECT PLASMABLAD 3.0S (MISCELLANEOUS) ×1 IMPLANT
DRAPE C-ARM 42X72 X-RAY (DRAPES) ×10 IMPLANT
DRAPE HALF SHEET 40X57 (DRAPES) IMPLANT
DRAPE LAPAROTOMY 100X72X124 (DRAPES) ×3 IMPLANT
DRAPE POUCH INSTRU U-SHP 10X18 (DRAPES) ×3 IMPLANT
DRSG PAD ABDOMINAL 8X10 ST (GAUZE/BANDAGES/DRESSINGS) ×4 IMPLANT
DURAPREP 26ML APPLICATOR (WOUND CARE) ×3 IMPLANT
DURASEAL APPLICATOR TIP (TIP) IMPLANT
DURASEAL SPINE SEALANT 3ML (MISCELLANEOUS) IMPLANT
ELECT REM PT RETURN 9FT ADLT (ELECTROSURGICAL) ×3
ELECTRODE REM PT RTRN 9FT ADLT (ELECTROSURGICAL) ×1 IMPLANT
GAUZE SPONGE 4X4 12PLY STRL (GAUZE/BANDAGES/DRESSINGS) ×3 IMPLANT
GAUZE SPONGE 4X4 16PLY XRAY LF (GAUZE/BANDAGES/DRESSINGS) IMPLANT
GLOVE BIO SURGEON STRL SZ7 (GLOVE) ×9 IMPLANT
GLOVE BIOGEL PI IND STRL 7.5 (GLOVE) IMPLANT
GLOVE BIOGEL PI IND STRL 8.5 (GLOVE) ×2 IMPLANT
GLOVE BIOGEL PI INDICATOR 7.5 (GLOVE) ×2
GLOVE BIOGEL PI INDICATOR 8.5 (GLOVE) ×4
GLOVE ECLIPSE 8.5 STRL (GLOVE) ×6 IMPLANT
GOWN STRL REUS W/ TWL LRG LVL3 (GOWN DISPOSABLE) IMPLANT
GOWN STRL REUS W/ TWL XL LVL3 (GOWN DISPOSABLE) IMPLANT
GOWN STRL REUS W/TWL 2XL LVL3 (GOWN DISPOSABLE) ×6 IMPLANT
GOWN STRL REUS W/TWL LRG LVL3 (GOWN DISPOSABLE)
GOWN STRL REUS W/TWL XL LVL3 (GOWN DISPOSABLE)
HEMOSTAT POWDER KIT SURGIFOAM (HEMOSTASIS) IMPLANT
KIT BASIN OR (CUSTOM PROCEDURE TRAY) ×3 IMPLANT
KIT ROOM TURNOVER OR (KITS) ×3 IMPLANT
NEEDLE HYPO 22GX1.5 SAFETY (NEEDLE) ×3 IMPLANT
NS IRRIG 1000ML POUR BTL (IV SOLUTION) ×3 IMPLANT
PACK LAMINECTOMY NEURO (CUSTOM PROCEDURE TRAY) ×3 IMPLANT
PAD ARMBOARD 7.5X6 YLW CONV (MISCELLANEOUS) ×9 IMPLANT
PATTIES SURGICAL .5 X1 (DISPOSABLE) ×3 IMPLANT
PLASMABLADE 3.0S (MISCELLANEOUS)
ROD RELINE TI 6.0X300MM STRT (Rod) ×4 IMPLANT
SCREW LOCK RELINE 5.5 TULIP (Screw) ×18 IMPLANT
SPONGE LAP 4X18 X RAY DECT (DISPOSABLE) IMPLANT
SPONGE SURGIFOAM ABS GEL 100 (HEMOSTASIS) ×3 IMPLANT
SUT PROLENE 6 0 BV (SUTURE) IMPLANT
SUT VIC AB 1 CT1 18XBRD ANBCTR (SUTURE) ×1 IMPLANT
SUT VIC AB 1 CT1 8-18 (SUTURE) ×6
SUT VIC AB 2-0 CP2 18 (SUTURE) ×6 IMPLANT
SUT VIC AB 3-0 SH 8-18 (SUTURE) ×3 IMPLANT
SYR 3ML LL SCALE MARK (SYRINGE) ×4 IMPLANT
TAPE CLOTH SURG 4X10 WHT LF (GAUZE/BANDAGES/DRESSINGS) ×2 IMPLANT
TOWEL OR 17X24 6PK STRL BLUE (TOWEL DISPOSABLE) ×3 IMPLANT
TOWEL OR 17X26 10 PK STRL BLUE (TOWEL DISPOSABLE) ×3 IMPLANT
TRAP SPECIMEN MUCOUS 40CC (MISCELLANEOUS) IMPLANT
TRAY FOLEY W/METER SILVER 16FR (SET/KITS/TRAYS/PACK) ×3 IMPLANT
TUBE CONNECTING 12'X1/4 (SUCTIONS) ×1
TUBE CONNECTING 12X1/4 (SUCTIONS) ×1 IMPLANT
WATER STERILE IRR 1000ML POUR (IV SOLUTION) ×3 IMPLANT

## 2016-02-11 NOTE — ED Notes (Signed)
Attempted IV with no success. 

## 2016-02-11 NOTE — ED Triage Notes (Signed)
Just received 4 mg of Zofran < 30 mins ago.

## 2016-02-11 NOTE — Progress Notes (Signed)
Patient ID: Russell Gonzales, male   DOB: July 23, 1950, 65 y.o.   MRN: [id number]    By signing my name below, I, Essence Howell, attest that this documentation has been prepared under the direction and in the presence of [name], MD Electronically Signed: Ladene Artist, ED Scribe 02/11/2016 at 12:29 PM.  Chief Complaint:  Chief Complaint  Patient presents with  . Back Pain    back surgery   HPI: Russell Gonzales is a 65 y.o. male, with a h/o arthritis and chronic back pain, who reports to [facility] today complaining of gradually worsening back pain onset the past 2 days. Pt describes pain as an aching sensation that has been more severe than any other back pain that he has ever experienced. Pt reports that pain is "deep" and is not reproducible with palpation. He reports increased pain with movement and states that pain is slightly improved with ambulating and reclining is a recliner. He also reports 1 episode of emesis PTA today but states that he is unsure if this is attributed to taking his hydrocodone or due to severity of pain. Pt is also taking tramadol and fentanyl for pain. Pt's last dose of tramadol was PTA. He denies fever, chills, abdominal pain, bladder/bowel incontinence, hematuria, numbness in LE. Pshx includes lumbar laminectomy/decompression and posterior lumbar fusion 4 level. He was discharged from rehab on 8/25 at [facility] and has physical therapy come out to his home a few days/week. Pt's last CT scan was 01/20/16 post-surgery.     Past Medical History:  Diagnosis Date  . Arthritis    "severe in my back" (08/11/2015)  . Chronic lower back pain   . Fatty liver   . GERD (gastroesophageal reflux disease)   . Hemorrhoids   . Hyperlipidemia   . Hypertension   . Low iron   . Prediabetes   . Seasonal allergies   . Shortness of breath dyspnea    With exertion   Past Surgical History:  Procedure Laterality Date  . ANKLE FRACTURE SURGERY Left 1972   "it was crushed; has screws  and plates"  . ANTERIOR CERVICAL DECOMP/DISCECTOMY FUSION  05/2008  . BACK SURGERY    . COLONOSCOPY W/ BIOPSIES AND POLYPECTOMY    . FRACTURE SURGERY    . KNEE ARTHROSCOPY Left 1970s   "removed cyst"  . LUMBAR LAMINECTOMY/DECOMPRESSION MICRODISCECTOMY N/A 08/11/2015   Procedure: Laminectomy - Thoracic eleven - Thoracic tweleve;  Surgeon: Eustace Moore, MD;  Location: [place] [facility];  Service: Neurosurgery;  Laterality: N/A;  . POLYPECTOMY  2016  . POSTERIOR LUMBAR FUSION  04/2008   "L1-S1; have rods and screws between 4 vertebrae"  . POSTERIOR LUMBAR FUSION 4 LEVEL N/A 01/18/2016   Procedure: Thoracic Eight-Lumbar Two Laminectomy with posterior decompression/segmental fixation;  Surgeon: [name], MD;  Location: [place] [facility] OR[facility];  Service: Neurosurgery;  Laterality: N/A;  T8 to L2 Laminectomy with posterior decompression/segmental fixation from T8 to L2  . PROSTATE BIOPSY  2012  . THORACIC LAMINECTOMY  08/11/2015   Decompressive thoracic laminectomy, medial facetectomy and foraminotomy T11 and T12   Social History   Social History  . Marital status: Married    Spouse name: N/A  . Number of children: N/A  . Years of education: N/A   Social History Main Topics  . Smoking status: Former Smoker    Packs/day: 1.00    Years: 15.00    Types: Cigars  . Smokeless tobacco: Never Used     Comment: "  quit smoking in the late 1980s"  . Alcohol use 21.0 oz/week    14 Shots of liquor, 21 Standard drinks or equivalent per week     Comment: 08/11/2015 whisky 2 drinks a day  . Drug use:     Types: Other-see comments     Comment: "smioked pot in the 1960's and 1970's; plus a few other things back then"  . Sexual activity: Not Currently   Other Topics Concern  . None   Social History Narrative   Lives with wife and 3 dogs in a one story home.  Has 5 children.  Retired from Architect work.  Education: high school.   Family History  Problem Relation Age of Onset  . Pancreatic cancer Mother   .  Prostate cancer Father   . Colon cancer Neg Hx    Allergies  Allergen Reactions  . Aspirin Hives  . Bc Fast Pain [Aspirin-Caffeine] Hives  . Ancef [Cefazolin] Rash    RASH  . Tape Rash    Surgical Tape- 2009ish  Back surgery - broke out- rash   Prior to Admission medications   Medication Sig Start Date End Date Taking? Authorizing Provider  atorvastatin (LIPITOR) 20 MG tablet Take 1 tablet (20 mg total) by mouth daily. 02/03/16  Yes Daniel J Angiulli, PA-C  famotidine (PEPCID) 20 MG tablet Take 1 tablet (20 mg total) by mouth 2 (two) times daily. 02/03/16  Yes Daniel J Angiulli, PA-C  fentaNYL (DURAGESIC - DOSED MCG/HR) 25 MCG/HR patch Place 1 patch (25 mcg total) onto the skin every 3 (three) days. 02/03/16  Yes Daniel J Angiulli, PA-C  fish oil-omega-3 fatty acids 1000 MG capsule Take 1 g by mouth 2 (two) times daily.     Yes Historical Provider, MD  gabapentin (NEURONTIN) 600 MG tablet Take 1 tablet (600 mg total) by mouth 3 (three) times daily. 02/03/16  Yes Daniel J Angiulli, PA-C  HYDROcodone-acetaminophen (NORCO/VICODIN) 5-325 MG tablet Take 1 tablet by mouth every 6 (six) hours as needed for moderate pain (breakthrough pain).   Yes Historical Provider, MD  methocarbamol (ROBAXIN) 750 MG tablet Take 1 tablet (750 mg total) by mouth 4 (four) times daily as needed for muscle spasms. 02/03/16  Yes Daniel J Angiulli, PA-C  Multiple Vitamins-Minerals (MULTIVITAMIN ADULT PO) Take 1 tablet by mouth daily.    Yes Historical Provider, MD  polyethylene glycol (MIRALAX / GLYCOLAX) packet Take 17 g by mouth 2 (two) times daily. 02/03/16  Yes Daniel J Angiulli, PA-C  traMADol (ULTRAM) 50 MG tablet Take 1 tablet (50 mg total) by mouth every 6 (six) hours as needed for moderate pain. 02/03/16  Yes Daniel J Angiulli, PA-C   ROS: The patient denies fevers, chills, night sweats, unintentional weight loss, chest pain, palpitations, wheezing, dyspnea on exertion, nausea, abdominal pain, dysuria, hematuria,  melena, numbness, weakness, or tingling. +back pain, +vomiting  All other systems have been reviewed and were otherwise negative with the exception of those mentioned in the HPI and as above.    PHYSICAL EXAM: Vitals:   02/11/16 1142  BP: (!) 142/98  Pulse: 71  Resp: 20  Temp: 97.7 F (36.5 C)   Body mass index is 36.96 kg/m.  General: Alert, no acute distress. Pt appears to be in significant pain. Needs assistance of 2 people to get back into back brace.  HEENT:  Normocephalic, atraumatic, oropharynx patent. Eye: Juliette Mangle [facility] Cardiovascular:  Regular rate and rhythm, no rubs murmurs or gallops.  No Carotid bruits, radial pulse  intact. No pedal edema.  Respiratory: Clear to auscultation bilaterally.  No wheezes, rales, or rhonchi.  No cyanosis, no use of accessory musculature Abdominal: No organomegaly, abdomen is soft and non-tender, positive bowel sounds.  No masses. Musculoskeletal: Gait intact. No edema. Healed surgical scar. Tenderness in the R lower para thoracic area.  Skin: No rashes. Neurologic: Facial musculature symmetric. Weakness of both LE.  Psychiatric: Patient acts appropriately throughout our interaction. Lymphatic: No cervical or submandibular lymphadenopathy  LABS:  EKG/XRAY:   Primary read interpreted by Dr. Everlene Farrier at [facility].  ASSESSMENT/PLAN: Patient had recently undergone fixation surgery of the lower thoracic and upper lumbar spine. He completed his rehabilitation and was released 1 week ago. On Wednesday morning he developed excruciating right-sided flank pain. This has been persistent the last 48 hours. Today he was nauseated and vomited. In our office he was given Zofran for nausea. I discussed his situation with Dr. Ellene Route. Patient to be transported by his wife to the emergency room at [facility] for evaluation.I personally performed the services described in this documentation, which was scribed in my presence. The recorded information has been reviewed and is  accurate.   Gross sideeffects, risk and benefits, and alternatives of medications d/w patient. Patient is aware that all medications have potential sideeffects and we are unable to predict every sideeffect or drug-drug interaction that may occur.  [name] MD 02/11/2016 11:58 AM

## 2016-02-11 NOTE — Anesthesia Procedure Notes (Signed)
Procedure Name: Intubation Date/Time: 02/11/2016 10:02 PM Performed by: Claris Che Pre-anesthesia Checklist: Patient identified, Emergency Drugs available, Suction available, Patient being monitored and Timeout performed Patient Re-evaluated:Patient Re-evaluated prior to inductionOxygen Delivery Method: Circle system utilized Preoxygenation: Pre-oxygenation with 100% oxygen Intubation Type: IV induction, Rapid sequence and Cricoid Pressure applied Laryngoscope Size: Mac and 3 Grade View: Grade II Tube type: Oral Tube size: 7.5 mm Number of attempts: 1 Airway Equipment and Method: Stylet Placement Confirmation: ETT inserted through vocal cords under direct vision,  positive ETCO2 and breath sounds checked- equal and bilateral Secured at: 22 cm Tube secured with: Tape Dental Injury: Teeth and Oropharynx as per pre-operative assessment

## 2016-02-11 NOTE — Anesthesia Preprocedure Evaluation (Addendum)
Anesthesia Evaluation  Patient identified by MRN, date of birth, ID band Patient awake    Reviewed: Allergy & Precautions, NPO status , Patient's Chart, lab work & pertinent test results  Airway Mallampati: II  TM Distance: >3 FB Neck ROM: Full    Dental  (+) Upper Dentures, Lower Dentures   Pulmonary former smoker,    breath sounds clear to auscultation       Cardiovascular hypertension, + Peripheral Vascular Disease   Rhythm:Regular Rate:Normal     Neuro/Psych negative neurological ROS  negative psych ROS   GI/Hepatic Neg liver ROS, GERD  Medicated,  Endo/Other  negative endocrine ROS  Renal/GU Renal InsufficiencyRenal disease  negative genitourinary   Musculoskeletal  (+) Arthritis , Osteoarthritis,    Abdominal   Peds negative pediatric ROS (+)  Hematology negative hematology ROS (+)   Anesthesia Other Findings - Negative Stress Test 2011  Reproductive/Obstetrics negative OB ROS                           Lab Results  Component Value Date   WBC 6.0 02/11/2016   HGB 9.7 (L) 02/11/2016   HCT 29.4 (L) 02/11/2016   MCV 91.9 02/11/2016   PLT 420 (H) 02/11/2016   Lab Results  Component Value Date   CREATININE 1.32 (H) 02/11/2016   BUN 17 02/11/2016   NA 136 02/11/2016   K 4.7 02/11/2016   CL 102 02/11/2016   CO2 25 02/11/2016   Lab Results  Component Value Date   INR 1.40 01/20/2016   INR 1.73 01/19/2016   INR 1.47 01/18/2016   01/2016 : EKG: sinus tachycardia.  Anesthesia Physical Anesthesia Plan  ASA: III and emergent  Anesthesia Plan: General   Post-op Pain Management:    Induction: Intravenous  Airway Management Planned: Oral ETT  Additional Equipment:   Intra-op Plan:   Post-operative Plan: Extubation in OR  Informed Consent: I have reviewed the patients History and Physical, chart, labs and discussed the procedure including the risks, benefits and  alternatives for the proposed anesthesia with the patient or authorized representative who has indicated his/her understanding and acceptance.   Dental advisory given  Plan Discussed with: CRNA  Anesthesia Plan Comments:        Anesthesia Quick Evaluation

## 2016-02-11 NOTE — ED Notes (Signed)
Paged neurosurgery/[facility]

## 2016-02-11 NOTE — ED Notes (Signed)
Patient transported to CT 

## 2016-02-11 NOTE — ED Provider Notes (Signed)
[place] DEPT Provider Note   CSN: [id number] Arrival date & time: 02/11/16  1313  History   Chief Complaint Chief Complaint  Patient presents with  . Post-op Problem   HPI  Russell Gonzales is an 65 y.o. male with history of chronic back pain s/p several spinal surgeries, most recently 01/18/16 T8-L2 laminectomy, decompression, and fusion who presents to the ED today for evaluation of progressive, severe, intractable right sided thoracic back pain that has developed over the past 2-3 days. He is sent to the ED by Dr. Everlene Farrier at [facility]. Dr. Ellene Route has also been contacted and aware that pt is on his way to the ED. Pt reports that initially the surgery post-op course was complicated by acute blood loss anemia, hypoxia, and hypotension resulting in a critical admission then inpatient rehab. Pt states he returned home approximately one week ago and had been doing well with his gabapentin, fentanyl patch, and tramadol until ~2 days ago. Pt states for the past two days he has a "deep" right sided thoracic back pain that is worse with movement and is constant. He cannot find a comfortable position. States it is not tender. Denies new weakness or numbness. Denies fever, chills. Denies bowel/bladder incontinence. He does state he has felt nauseated and threw up twice today.  Past Medical History:  Diagnosis Date  . Arthritis    "severe in my back" (08/11/2015)  . Chronic lower back pain   . Fatty liver   . GERD (gastroesophageal reflux disease)   . Hemorrhoids   . Hyperlipidemia   . Hypertension   . Low iron   . Prediabetes   . Seasonal allergies   . Shortness of breath dyspnea    With exertion    Patient Active Problem List   Diagnosis Date Noted  . Radicular pain   . Neuropathic pain   . Hyponatremia   . Seborrhea   . Rib pain on left side   . AKI (acute kidney injury) ([place])   . Acute blood loss anemia   . Post-operative pain   . Morbid obesity due to excess calories ([place])   . Encounter  for central line placement   . Acute respiratory failure ([place])   . Encounter for intubation   . S/P lumbar fusion 01/18/2016  . 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 Myelopathy ([place]) 01/09/2016  . Back pain 01/09/2016  . Thoracic stenosis 08/11/2015  . 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 Other and unspecified hyperlipidemia 03/11/2013  . Essential hypertension 09/01/2009  . DIZZINESS 09/01/2009  . CHEST PAIN UNSPECIFIED 09/01/2009    Past Surgical History:  Procedure Laterality Date  . ANKLE FRACTURE SURGERY Left 1972   "it was crushed; has screws and plates"  . ANTERIOR CERVICAL DECOMP/DISCECTOMY FUSION  05/2008  . BACK SURGERY    . COLONOSCOPY W/ BIOPSIES AND POLYPECTOMY    . FRACTURE SURGERY    . KNEE ARTHROSCOPY Left 1970s   "removed cyst"  . LUMBAR LAMINECTOMY/DECOMPRESSION MICRODISCECTOMY N/A 08/11/2015   Procedure: Laminectomy - Thoracic eleven - Thoracic tweleve;  Surgeon: Eustace Moore, MD;  Location: [facility];  Service: Neurosurgery;  Laterality: N/A;  . POLYPECTOMY  2016  . POSTERIOR LUMBAR FUSION  04/2008   "L1-S1; have rods and screws between 4 vertebrae"  . POSTERIOR LUMBAR FUSION 4 LEVEL N/A 01/18/2016   Procedure: Thoracic Eight-Lumbar Two Laminectomy with posterior decompression/segmental fixation;  Surgeon: [name], MD;  Location: [place] NEURO ORS;  Service: Neurosurgery;  Laterality: N/A;  T8 to L2 Laminectomy with posterior decompression/segmental fixation from T8 to L2  . PROSTATE BIOPSY  2012  . THORACIC LAMINECTOMY  08/11/2015   Decompressive thoracic laminectomy, medial facetectomy and foraminotomy T11 and T12      Home Medications    Prior to Admission medications   Medication Sig Start Date End Date Taking? Authorizing Provider  atorvastatin (LIPITOR) 20 MG tablet Take 1 tablet (20 mg total) by mouth daily. 02/03/16   Lavon Paganini Angiulli, PA-C  famotidine (PEPCID) 20 MG tablet Take 1 tablet (20 mg total) by mouth 2 (two) times daily. 02/03/16    Lavon Paganini Angiulli, PA-C  fentaNYL (DURAGESIC - DOSED MCG/HR) 25 MCG/HR patch Place 1 patch (25 mcg total) onto the skin every 3 (three) days. 02/03/16   Lavon Paganini Angiulli, PA-C  fish oil-omega-3 fatty acids 1000 MG capsule Take 1 g by mouth 2 (two) times daily.      Historical Provider, MD  gabapentin (NEURONTIN) 600 MG tablet Take 1 tablet (600 mg total) by mouth 3 (three) times daily. 02/03/16   Lavon Paganini Angiulli, PA-C  HYDROcodone-acetaminophen (NORCO/VICODIN) 5-325 MG tablet Take 1 tablet by mouth every 6 (six) hours as needed for moderate pain (breakthrough pain).    Historical Provider, MD  methocarbamol (ROBAXIN) 750 MG tablet Take 1 tablet (750 mg total) by mouth 4 (four) times daily as needed for muscle spasms. 02/03/16   Lavon Paganini Angiulli, PA-C  Multiple Vitamins-Minerals (MULTIVITAMIN ADULT PO) Take 1 tablet by mouth daily.     Historical Provider, MD  polyethylene glycol (MIRALAX / GLYCOLAX) packet Take 17 g by mouth 2 (two) times daily. 02/03/16   Lavon Paganini Angiulli, PA-C  traMADol (ULTRAM) 50 MG tablet Take 1 tablet (50 mg total) by mouth every 6 (six) hours as needed for moderate pain. 02/03/16   Lavon Paganini Angiulli, PA-C    Family History Family History  Problem Relation Age of Onset  . Pancreatic cancer Mother   . Prostate cancer Father   . Colon cancer Neg Hx     Social History Social History  Substance Use Topics  . Smoking status: Former Smoker    Packs/day: 1.00    Years: 15.00    Types: Cigars  . Smokeless tobacco: Never Used     Comment: "quit smoking in the late 1980s"  . Alcohol use 21.0 oz/week    14 Shots of liquor, 21 Standard drinks or equivalent per week     Comment: 08/11/2015 whisky 2 drinks a day     Allergies   Aspirin; Bc fast pain [aspirin-caffeine]; Ancef [cefazolin]; and Tape   Review of Systems Review of Systems 10 Systems reviewed and are negative for acute change except as noted in the HPI.  Physical Exam Updated Vital Signs BP (!) 136/107    Pulse 82   Temp 97.7 F (36.5 C) (Oral)   Resp 18   SpO2 100%   Physical Exam  Constitutional: He is oriented to person, place, and time.  Appears uncomfortable  HENT:  Right Ear: External ear normal.  Left Ear: External ear normal.  Nose: Nose normal.  Mouth/Throat: Oropharynx is clear and moist. No oropharyngeal exudate.  Eyes: Conjunctivae are normal.  Neck: Neck supple.  Cardiovascular: Normal rate, regular rhythm, normal heart sounds and intact distal pulses.   Pulmonary/Chest: Effort normal and breath sounds normal. No respiratory distress. He has no wheezes.  Abdominal: Soft. Bowel sounds are normal. He exhibits no distension. There is no tenderness. There is no  rebound and no guarding.  Musculoskeletal: He exhibits no edema.  Well healing surgical scar on back. No midline or paraspinal tenderness.  Moves all extremities freely  Lymphadenopathy:    He has no cervical adenopathy.  Neurological: He is alert and oriented to person, place, and time. No cranial nerve deficit.  Intact strength bilateral UE Decreased but symmetric strength bilateral LE Sensation intact throughout  Skin: Skin is warm and dry.  Psychiatric: He has a normal mood and affect.  Nursing note and vitals reviewed.  Vitals:   02/11/16 1411 02/11/16 1430 02/11/16 1530 02/11/16 1557  BP: 176/99 186/99 148/93   Pulse: 64 68 72   Resp: 23   16  Temp:      TempSrc:      SpO2: 100% 100% 97%      ED Treatments / Results  Labs (all labs ordered are listed, but only abnormal results are displayed) Labs Reviewed  COMPREHENSIVE METABOLIC PANEL - Abnormal; Notable for the following:       Result Value   Glucose, Bld 104 (*)    Creatinine, Ser 1.32 (*)    GFR calc non Af Amer 55 (*)    All other components within normal limits  CBC - Abnormal; Notable for the following:    RBC 3.20 (*)    Hemoglobin 9.7 (*)    HCT 29.4 (*)    Platelets 420 (*)    All other components within normal limits    URINALYSIS, ROUTINE W REFLEX MICROSCOPIC (NOT AT [facility])  I-STAT CG4 LACTIC ACID, ED    EKG  EKG Interpretation None       Radiology No results found.  Procedures Procedures (including critical care time)  Medications Ordered in ED Medications  iopamidol (ISOVUE-300) 61 % injection (not administered)  morphine 4 MG/ML injection 4 mg (4 mg Intravenous Given 02/11/16 1512)  ondansetron (ZOFRAN) injection 4 mg (4 mg Intravenous Given 02/11/16 1512)     Initial Impression / Assessment and Plan / ED Course  I have reviewed the triage vital signs and the nursing notes.  Pertinent labs & imaging results that were available during my care of the patient were reviewed by me and considered in my medical decision making (see chart for details).  Clinical Course    2:19 PM Spoke with Aaron Edelman, neurosurgery nurse. Dr. Ellene Route in the OR. Notified that pt is here. Labs drawn, pain meds ordered. Dr. Ellene Route would like a CT thoracolumbar spine with and without contrast which I have ordered. Delay in labs as IV team needs to be consulted.  3:50 PM Pain much improved after morphine. Renal function improving from prior. Anemia improving from prior. Lactic acid negative. UA unremarkable. CT T and L spine pending. Dispo pending consultation by Dr. Ellene Route. At end of shift will sign out to Dr. Laneta Simmers who will follow up on neurosurgery consultation and disposition per Dr. Ellene Route.  Final Clinical Impressions(s) / ED Diagnoses   Final diagnoses:  None    New Prescriptions New Prescriptions   No medications on file     Anne Ng, Hershal Coria 02/11/16 [place], MD 02/12/16 [phone]

## 2016-02-11 NOTE — ED Notes (Signed)
Dr. Everlene Farrier called to advise he is sending patient with R lower thoracic pain s/p back surgery 01/18/16.  Patient just got out of rehab one week ago and is having back pain uncontrolled with pain meds.   E[facility]ner did surgery, but was in surgery today and Dr. Everlene Farrier unable to get in touch with him.  Came by private vehicle.

## 2016-02-11 NOTE — H&P (Signed)
Russell Gonzales is an 65 y.o. male.   Chief Complaint: Severe onset of back pain with flank pain nausea and vomiting HPI: Russell Gonzales is a 65 year old individual who underwent surgical intervention in August of this year about 3 weeks ago. He underwent fixation and fusion from T7-L2 with decompression via wide laminectomy and the rectal lumbar junction. Had a prolonged period of recuperation but recently gone home and on Wednesday he noted the onset of significant back pain. This was so severe that he became nauseated. Today he presented to the emergency department after being seen by Dr. Marcellus Scott. CT scan demonstrates a the patient has" his rods at the connector site with his previous fusion. Is also poor placement of several of pedicle screws. He is now being admitted to undergo surgical revision of the hardware.  Past Medical History:  Diagnosis Date  . Arthritis    "severe in my back" (08/11/2015)  . Chronic lower back pain   . Fatty liver   . GERD (gastroesophageal reflux disease)   . Hemorrhoids   . Hyperlipidemia   . Hypertension   . Low iron   . Prediabetes   . Seasonal allergies   . Shortness of breath dyspnea    With exertion    Past Surgical History:  Procedure Laterality Date  . ANKLE FRACTURE SURGERY Left 1972   "it was crushed; has screws and plates"  . ANTERIOR CERVICAL DECOMP/DISCECTOMY FUSION  05/2008  . BACK SURGERY    . COLONOSCOPY W/ BIOPSIES AND POLYPECTOMY    . FRACTURE SURGERY    . KNEE ARTHROSCOPY Left 1970s   "removed cyst"  . LUMBAR LAMINECTOMY/DECOMPRESSION MICRODISCECTOMY N/A 08/11/2015   Procedure: Laminectomy - Thoracic eleven - Thoracic tweleve;  Surgeon: Eustace Moore, MD;  Location: [place] [facility];  Service: Neurosurgery;  Laterality: N/A;  . POLYPECTOMY  2016  . POSTERIOR LUMBAR FUSION  04/2008   "L1-S1; have rods and screws between 4 vertebrae"  . POSTERIOR LUMBAR FUSION 4 LEVEL N/A 01/18/2016   Procedure: Thoracic Eight-Lumbar Two Laminectomy with  posterior decompression/segmental fixation;  Surgeon: [name], MD;  Location: [place] [facility];  Service: Neurosurgery;  Laterality: N/A;  T8 to L2 Laminectomy with posterior decompression/segmental fixation from T8 to L2  . PROSTATE BIOPSY  2012  . THORACIC LAMINECTOMY  08/11/2015   Decompressive thoracic laminectomy, medial facetectomy and foraminotomy T11 and T12    Family History  Problem Relation Age of Onset  . Pancreatic cancer Mother   . Prostate cancer Father   . Colon cancer Neg Hx    Social History:  reports that he has quit smoking. His smoking use included Cigars. He has a 15.00 pack-year smoking history. He has never used smokeless tobacco. He reports that he drinks about 21.0 oz of alcohol per week . He reports that he uses drugs, including Other-see comments.  Allergies:  Allergies  Allergen Reactions  . Aspirin Hives  . Bc Fast Pain [Aspirin-Caffeine] Hives  . Ancef [Cefazolin] Rash    RASH  . Tape Rash    Surgical Tape- 2009ish  Back surgery - broke out- rash     (Not in a hospital admission)  Results for orders placed or performed during the hospital encounter of 02/11/16 (from the past 48 hour(s))  Urinalysis, Routine w reflex microscopic     Status: None   Collection Time: 02/11/16  1:24 PM  Result Value Ref Range   Color, Urine YELLOW YELLOW   APPearance CLEAR CLEAR  Specific Gravity, Urine 1.013 1.005 - 1.030   pH 8.0 5.0 - 8.0   Glucose, UA NEGATIVE NEGATIVE mg/dL   Hgb urine dipstick NEGATIVE NEGATIVE   Bilirubin Urine NEGATIVE NEGATIVE   Ketones, ur NEGATIVE NEGATIVE mg/dL   Protein, ur NEGATIVE NEGATIVE mg/dL   Nitrite NEGATIVE NEGATIVE   Leukocytes, UA NEGATIVE NEGATIVE    Comment: MICROSCOPIC NOT DONE ON URINES WITH NEGATIVE PROTEIN, BLOOD, LEUKOCYTES, NITRITE, OR GLUCOSE <1000 mg/dL.  Comprehensive metabolic panel     Status: Abnormal   Collection Time: 02/11/16  2:42 PM  Result Value Ref Range   Sodium 136 135 - 145 mmol/L   Potassium 4.7  3.5 - 5.1 mmol/L   Chloride 102 101 - 111 mmol/L   CO2 25 22 - 32 mmol/L   Glucose, Bld 104 (H) 65 - 99 mg/dL   BUN 17 6 - 20 mg/dL   Creatinine, Ser 1.32 (H) 0.61 - 1.24 mg/dL   Calcium 9.4 8.9 - 10.3 mg/dL   Total Protein 6.5 6.5 - 8.1 g/dL   Albumin 3.5 3.5 - 5.0 g/dL   AST 30 15 - 41 U/L   ALT 34 17 - 63 U/L   Alkaline Phosphatase 108 38 - 126 U/L   Total Bilirubin 0.6 0.3 - 1.2 mg/dL   GFR calc non Af Amer 55 (L) >60 mL/min   GFR calc Af Amer >60 >60 mL/min    Comment: (NOTE) The eGFR has been calculated using the CKD EPI equation. This calculation has not been validated in all clinical situations. eGFR's persistently <60 mL/min signify possible Chronic Kidney Disease.    Anion gap 9 5 - 15  CBC     Status: Abnormal   Collection Time: 02/11/16  2:42 PM  Result Value Ref Range   WBC 6.0 4.0 - 10.5 K/uL   RBC 3.20 (L) 4.22 - 5.81 MIL/uL   Hemoglobin 9.7 (L) 13.0 - 17.0 g/dL   HCT 29.4 (L) 39.0 - 52.0 %   MCV 91.9 78.0 - 100.0 fL   MCH 30.3 26.0 - 34.0 pg   MCHC 33.0 30.0 - 36.0 g/dL   RDW 13.3 11.5 - 15.5 %   Platelets 420 (H) 150 - 400 K/uL  I-Stat CG4 Lactic Acid, ED     Status: None   Collection Time: 02/11/16  2:50 PM  Result Value Ref Range   Lactic Acid, Venous 1.68 0.5 - 1.9 mmol/L   Ct Thoracic Spine W Contrast  Result Date: 02/11/2016 CLINICAL DATA:  Status post back surgery January 18, 2016, released from rehab on February 04, 2016. Uncontrollable pain, nausea and vomiting. EXAM: CT THORACIC AND LUMBAR SPINE WITH CONTRAST TECHNIQUE: Multidetector CT imaging of the thoracic and lumbar spine was performed with contrast. Multiplanar CT image reconstructions were also generated. CONTRAST:  100 cc Isovue 300 COMPARISON:  Thoracolumbar spine CT August 10th 2017 FINDINGS: SEGMENTATION: Transitional anatomy, lumbarized RIGHT S1 vertebral body. ALIGNMENT: Severe S-type scoliosis on this nonweightbearing examination, dextro apex at approximately T8-9, levo apex at approximately  L3-4. Straightened thoracic kyphosis and lumbar lordosis. OSSEOUS STRUCTURES: T7 through T9 RIGHT and T7 through T10 LEFT pedicle screws. The RIGHT T8 and T9 pedicle screws extend through medial pedicle cortex partially effacing the lateral recess which could affect the traversing RIGHT T9 and T10 nerves. LEFT T8 and T9 pedicle screws are lateral to the pedicle. Nondisplaced fracture through T10 RIGHT superior articular facet. Non incorporated posterior bone graft material. The bilateral lumbar rods have pulled out of the  joining bracket at L1-2. The RIGHT lumbar rod is now outside the joining bracket. The LEFT lumbar rod is at the joining bracket opening. LEFT L2 pedicle screw is medially located may affect the traversing LEFT L3 nerve. Well seated L3 pedicle screws. Well-seated LEFT L5, bilateral S1 and S-2 pedicle screws. Hardware is intact. Status post recent T10-11 through L1-2 laminectomies. Incorporated posterior bone graft material L1-2 through upper sacral spine. No destructive bony lesions. No definite osseous or intradiscal enhancement though limited assessment by CT. SOFT TISSUES: 4.6 x 4.2 x 14.1 cm (transverse by AP by CC) rim and enhancing fluid collection within the paraspinal muscles from T4-5 through T10-11. A second 2.9 x 3.8 x 9.5 cm fluid collection from T9-10 - T12-L1 within the laminectomy surgical bed. Limited assessment for thecal sac effacement physis CT. The fluid collections do not clearly communicate. Dependent atelectasis. The heart appears mildly enlarged. Mild coronary artery calcifications. 3.3 cm fusiform aortoiliac ectasia. IMPRESSION: Status post T7 through S2 posterior instrumentation and decompression of varying ages. New hardware failure; the RIGHT lumbar rod has backed out of joining bracket at L1-2, the LEFT lumbar rod is at the joining bracket opening. New 2.9 x 3.8 x 9.5 cm fluid collection within the T9-10 through T12-L1 laminectomy surgical bed most compatible with  abscess. 4.6 x 4.2 x 14.1 cm mid thoracic paraspinal fluid collection/abscess. Aortoiliac ectasia. Recommend followup by ultrasound in 3 years. This recommendation follows ACR consensus guidelines: White Paper of the ACR Incidental Findings Committee II on Vascular Findings. J Am Coll Radiol 2013; 10:789-794 Electronically Signed   By: Elon Alas M.D.   On: 02/11/2016 19:12   Ct Lumbar Spine W Contrast  Result Date: 02/11/2016 CLINICAL DATA:  Status post back surgery January 18, 2016, released from rehab on February 04, 2016. Uncontrollable pain, nausea and vomiting. EXAM: CT THORACIC AND LUMBAR SPINE WITH CONTRAST TECHNIQUE: Multidetector CT imaging of the thoracic and lumbar spine was performed with contrast. Multiplanar CT image reconstructions were also generated. CONTRAST:  100 cc Isovue 300 COMPARISON:  Thoracolumbar spine CT August 10th 2017 FINDINGS: SEGMENTATION: Transitional anatomy, lumbarized RIGHT S1 vertebral body. ALIGNMENT: Severe S-type scoliosis on this nonweightbearing examination, dextro apex at approximately T8-9, levo apex at approximately L3-4. Straightened thoracic kyphosis and lumbar lordosis. OSSEOUS STRUCTURES: T7 through T9 RIGHT and T7 through T10 LEFT pedicle screws. The RIGHT T8 and T9 pedicle screws extend through medial pedicle cortex partially effacing the lateral recess which could affect the traversing RIGHT T9 and T10 nerves. LEFT T8 and T9 pedicle screws are lateral to the pedicle. Nondisplaced fracture through T10 RIGHT superior articular facet. Non incorporated posterior bone graft material. The bilateral lumbar rods have pulled out of the joining bracket at L1-2. The RIGHT lumbar rod is now outside the joining bracket. The LEFT lumbar rod is at the joining bracket opening. LEFT L2 pedicle screw is medially located may affect the traversing LEFT L3 nerve. Well seated L3 pedicle screws. Well-seated LEFT L5, bilateral S1 and S-2 pedicle screws. Hardware is intact. Status  post recent T10-11 through L1-2 laminectomies. Incorporated posterior bone graft material L1-2 through upper sacral spine. No destructive bony lesions. No definite osseous or intradiscal enhancement though limited assessment by CT. SOFT TISSUES: 4.6 x 4.2 x 14.1 cm (transverse by AP by CC) rim and enhancing fluid collection within the paraspinal muscles from T4-5 through T10-11. A second 2.9 x 3.8 x 9.5 cm fluid collection from T9-10 - T12-L1 within the laminectomy surgical bed. Limited assessment for  thecal sac effacement physis CT. The fluid collections do not clearly communicate. Dependent atelectasis. The heart appears mildly enlarged. Mild coronary artery calcifications. 3.3 cm fusiform aortoiliac ectasia. IMPRESSION: Status post T7 through S2 posterior instrumentation and decompression of varying ages. New hardware failure; the RIGHT lumbar rod has backed out of joining bracket at L1-2, the LEFT lumbar rod is at the joining bracket opening. New 2.9 x 3.8 x 9.5 cm fluid collection within the T9-10 through T12-L1 laminectomy surgical bed most compatible with abscess. 4.6 x 4.2 x 14.1 cm mid thoracic paraspinal fluid collection/abscess. Aortoiliac ectasia. Recommend followup by ultrasound in 3 years. This recommendation follows ACR consensus guidelines: White Paper of the ACR Incidental Findings Committee II on Vascular Findings. J Am Coll Radiol 2013; 10:789-794 Electronically Signed   By: Elon Alas M.D.   On: 02/11/2016 19:12    Review of Systems  HENT: Negative.   Eyes: Negative.   Respiratory: Negative.   Cardiovascular: Negative.   Gastrointestinal: Positive for nausea.  Genitourinary: Negative.   Musculoskeletal: Positive for back pain.  Skin: Negative.   Neurological: Positive for sensory change and weakness.  Endo/Heme/Allergies: Negative.   Psychiatric/Behavioral: Negative.     Blood pressure 166/87, pulse 92, temperature 97.7 F (36.5 C), temperature source Oral, resp. rate 16,  SpO2 93 %. Physical Exam  Constitutional: He is oriented to person, place, and time. He appears well-developed and well-nourished.  HENT:  Head: Normocephalic and atraumatic.  Eyes: Conjunctivae and EOM are normal. Pupils are equal, round, and reactive to light.  Neck: Normal range of motion. Neck supple.  Respiratory: Effort normal and breath sounds normal.  GI: Soft. Bowel sounds are normal.  Musculoskeletal:  Significant centralized back pain. Incision is clean and dry.  Neurological: He is alert and oriented to person, place, and time.  Absent deep 10 reflexes in lower extremities. Absent reflexes in the upper extremities. Babinskis are equivocal bilaterally tone is moderately increased in the proximal lower extremities strength is 4 out of 5 in all major groups.  Skin: Skin is warm and dry.  Psychiatric: He has a normal mood and affect. His behavior is normal. Judgment and thought content normal.     Assessment/Plan Status post decompression arthrodesis T7 L2. With loss of fixation.  Revision of hardware and fixation from T7-L2.  Earleen Newport, MD 02/11/2016, 8:45 PM

## 2016-02-11 NOTE — Patient Instructions (Signed)
     IF you received an x-ray today, you will receive an invoice from [facility]. Please contact [facility] at [phone] with questions or concerns regarding your invoice.   IF you received labwork today, you will receive an invoice from [facility]. Please contact [facility] at [phone] with questions or concerns regarding your invoice.   Our billing staff will not be able to assist you with questions regarding bills from these companies.  You will be contacted with the lab results as soon as they are available. The fastest way to get your results is to activate your My Chart account. Instructions are located on the last page of this paperwork. If you have not heard from us regarding the results in 2 weeks, please contact this office.      

## 2016-02-11 NOTE — ED Triage Notes (Signed)
Pt reports to the ED for eval of pain and N/V. Pt reports he had a laminectomy and had complications and had to undergo inpatient rehab ect. He reports he developed a severe pain this past Wednesday that feels different and has progressed to where it is no longer going away. Pt was seen by his PCP and was sent here. Pt has some associated N/V. Pt new denies any numbness/tingling.

## 2016-02-12 ENCOUNTER — Inpatient Hospital Stay (HOSPITAL_COMMUNITY): Payer: PPO

## 2016-02-12 DIAGNOSIS — M5104 Intervertebral disc disorders with myelopathy, thoracic region: Secondary | ICD-10-CM

## 2016-02-12 DIAGNOSIS — R000 Tachycardia, unspecified: Secondary | ICD-10-CM

## 2016-02-12 LAB — BASIC METABOLIC PANEL
Anion gap: 10 (ref 5–15)
BUN: 19 mg/dL (ref 6–20)
CO2: 24 mmol/L (ref 22–32)
Calcium: 8.7 mg/dL — ABNORMAL LOW (ref 8.9–10.3)
Chloride: 101 mmol/L (ref 101–111)
Creatinine, Ser: 1.52 mg/dL — ABNORMAL HIGH (ref 0.61–1.24)
GFR calc Af Amer: 54 mL/min — ABNORMAL LOW (ref >60)
GFR, EST NON AFRICAN AMERICAN: 47 mL/min — AB (ref >60)
Glucose, Bld: 200 mg/dL — ABNORMAL HIGH (ref 65–99)
POTASSIUM: 4.4 mmol/L (ref 3.5–5.1)
SODIUM: 135 mmol/L (ref 135–145)

## 2016-02-12 LAB — HEPATIC FUNCTION PANEL
ALK PHOS: 86 U/L (ref 38–126)
ALT: 34 U/L (ref 17–63)
AST: 37 U/L (ref 15–41)
Albumin: 2.9 g/dL — ABNORMAL LOW (ref 3.5–5.0)
BILIRUBIN TOTAL: 0.4 mg/dL (ref 0.3–1.2)
Total Protein: 5.2 g/dL — ABNORMAL LOW (ref 6.5–8.1)

## 2016-02-12 LAB — CBC
HEMATOCRIT: 22.9 % — AB (ref 39.0–52.0)
Hemoglobin: 7.4 g/dL — ABNORMAL LOW (ref 13.0–17.0)
MCH: 30.5 pg (ref 26.0–34.0)
MCHC: 32.3 g/dL (ref 30.0–36.0)
MCV: 94.2 fL (ref 78.0–100.0)
PLATELETS: 293 10*3/uL (ref 150–400)
RBC: 2.43 MIL/uL — ABNORMAL LOW (ref 4.22–5.81)
RDW: 13.6 % (ref 11.5–15.5)
WBC: 8.5 10*3/uL (ref 4.0–10.5)

## 2016-02-12 LAB — CBC WITH DIFFERENTIAL/PLATELET
BASOS ABS: 0 10*3/uL (ref 0.0–0.1)
Basophils Relative: 0 %
EOS ABS: 0 10*3/uL (ref 0.0–0.7)
EOS PCT: 0 %
HCT: 26.1 % — ABNORMAL LOW (ref 39.0–52.0)
Hemoglobin: 8.3 g/dL — ABNORMAL LOW (ref 13.0–17.0)
Lymphocytes Relative: 3 %
Lymphs Abs: 0.3 10*3/uL — ABNORMAL LOW (ref 0.7–4.0)
MCH: 30.3 pg (ref 26.0–34.0)
MCHC: 31.8 g/dL (ref 30.0–36.0)
MCV: 95.3 fL (ref 78.0–100.0)
Monocytes Absolute: 0.6 10*3/uL (ref 0.1–1.0)
Monocytes Relative: 5 %
Neutro Abs: 10.1 10*3/uL — ABNORMAL HIGH (ref 1.7–7.7)
Neutrophils Relative %: 92 %
PLATELETS: 331 10*3/uL (ref 150–400)
RBC: 2.74 MIL/uL — AB (ref 4.22–5.81)
RDW: 13.7 % (ref 11.5–15.5)
WBC: 11 10*3/uL — AB (ref 4.0–10.5)

## 2016-02-12 LAB — PROTIME-INR
INR: 1.16
Prothrombin Time: 14.8 seconds (ref 11.4–15.2)

## 2016-02-12 MED ORDER — FAMOTIDINE 20 MG PO TABS
20.0000 mg | ORAL_TABLET | Freq: Two times a day (BID) | ORAL | Status: DC
  Administered 2016-02-12 – 2016-02-16 (×9): 20 mg via ORAL
  Filled 2016-02-12 (×10): qty 1

## 2016-02-12 MED ORDER — METHOCARBAMOL 750 MG PO TABS
750.0000 mg | ORAL_TABLET | Freq: Four times a day (QID) | ORAL | Status: DC | PRN
  Administered 2016-02-12 – 2016-02-16 (×6): 750 mg via ORAL
  Filled 2016-02-12 (×2): qty 1
  Filled 2016-02-12: qty 2
  Filled 2016-02-12: qty 1
  Filled 2016-02-12: qty 2
  Filled 2016-02-12 (×3): qty 1

## 2016-02-12 MED ORDER — MENTHOL 3 MG MT LOZG: 1.0000 | LOZENGE | OROMUCOSAL | Status: DC | PRN

## 2016-02-12 MED ORDER — HYDROMORPHONE HCL 1 MG/ML IJ SOLN
INTRAMUSCULAR | Status: AC
  Filled 2016-02-12: qty 1

## 2016-02-12 MED ORDER — GABAPENTIN 600 MG PO TABS
600.0000 mg | ORAL_TABLET | Freq: Three times a day (TID) | ORAL | Status: DC
  Administered 2016-02-12 – 2016-02-16 (×14): 600 mg via ORAL
  Filled 2016-02-12 (×15): qty 1

## 2016-02-12 MED ORDER — TRAMADOL HCL 50 MG PO TABS
50.0000 mg | ORAL_TABLET | Freq: Four times a day (QID) | ORAL | Status: DC | PRN
  Filled 2016-02-12 (×2): qty 1

## 2016-02-12 MED ORDER — ATORVASTATIN CALCIUM 10 MG PO TABS
20.0000 mg | ORAL_TABLET | Freq: Every day | ORAL | Status: DC
  Administered 2016-02-12 – 2016-02-16 (×5): 20 mg via ORAL
  Filled 2016-02-12: qty 1
  Filled 2016-02-12: qty 2
  Filled 2016-02-12: qty 1
  Filled 2016-02-12 (×2): qty 2

## 2016-02-12 MED ORDER — POLYETHYLENE GLYCOL 3350 17 G PO PACK
17.0000 g | PACK | Freq: Every day | ORAL | Status: DC | PRN
  Administered 2016-02-12: 17 g via ORAL

## 2016-02-12 MED ORDER — SUGAMMADEX SODIUM 200 MG/2ML IV SOLN
INTRAVENOUS | Status: DC | PRN
  Administered 2016-02-12: 200 mg via INTRAVENOUS

## 2016-02-12 MED ORDER — VANCOMYCIN HCL 1000 MG IV SOLR
INTRAVENOUS | Status: DC | PRN
  Administered 2016-02-12: 1000 mg

## 2016-02-12 MED ORDER — ALUM & MAG HYDROXIDE-SIMETH 200-200-20 MG/5ML PO SUSP
30.0000 mL | Freq: Four times a day (QID) | ORAL | Status: DC | PRN
  Administered 2016-02-13: 30 mL via ORAL
  Filled 2016-02-12: qty 30

## 2016-02-12 MED ORDER — SODIUM CHLORIDE 0.9 % IV SOLN: 250.0000 mL | INTRAVENOUS | Status: DC

## 2016-02-12 MED ORDER — SODIUM CHLORIDE 0.9 % IV BOLUS (SEPSIS)
2000.0000 mL | Freq: Once | INTRAVENOUS | Status: DC
Start: 2016-02-12 — End: 2016-02-16

## 2016-02-12 MED ORDER — SENNA 8.6 MG PO TABS
1.0000 | ORAL_TABLET | Freq: Two times a day (BID) | ORAL | Status: DC
  Administered 2016-02-12 – 2016-02-16 (×8): 8.6 mg via ORAL
  Filled 2016-02-12 (×8): qty 1

## 2016-02-12 MED ORDER — MORPHINE SULFATE (PF) 2 MG/ML IV SOLN
1.0000 mg | INTRAVENOUS | Status: DC | PRN
  Administered 2016-02-12 (×2): 2 mg via INTRAVENOUS
  Administered 2016-02-12: 1 mg via INTRAVENOUS
  Administered 2016-02-12 – 2016-02-13 (×3): 2 mg via INTRAVENOUS
  Administered 2016-02-13: 1 mg via INTRAVENOUS
  Administered 2016-02-14 – 2016-02-15 (×2): 2 mg via INTRAVENOUS
  Filled 2016-02-12 (×10): qty 1

## 2016-02-12 MED ORDER — METHOCARBAMOL 1000 MG/10ML IJ SOLN
500.0000 mg | Freq: Four times a day (QID) | INTRAVENOUS | Status: DC | PRN
  Administered 2016-02-14: 500 mg via INTRAVENOUS
  Filled 2016-02-12 (×3): qty 5

## 2016-02-12 MED ORDER — DOCUSATE SODIUM 100 MG PO CAPS
100.0000 mg | ORAL_CAPSULE | Freq: Two times a day (BID) | ORAL | Status: DC
  Administered 2016-02-12 – 2016-02-16 (×9): 100 mg via ORAL
  Filled 2016-02-12 (×9): qty 1

## 2016-02-12 MED ORDER — HYDROMORPHONE HCL 1 MG/ML IJ SOLN
0.2500 mg | INTRAMUSCULAR | Status: DC | PRN
  Administered 2016-02-12 (×2): 0.5 mg via INTRAVENOUS

## 2016-02-12 MED ORDER — ONDANSETRON HCL 4 MG/2ML IJ SOLN
INTRAMUSCULAR | Status: DC | PRN
  Administered 2016-02-12: 4 mg via INTRAVENOUS

## 2016-02-12 MED ORDER — VANCOMYCIN HCL IN DEXTROSE 1-5 GM/200ML-% IV SOLN
1000.0000 mg | Freq: Two times a day (BID) | INTRAVENOUS | Status: AC
  Administered 2016-02-12 (×2): 1000 mg via INTRAVENOUS
  Filled 2016-02-12 (×2): qty 200

## 2016-02-12 MED ORDER — SODIUM CHLORIDE 0.9% FLUSH
3.0000 mL | Freq: Two times a day (BID) | INTRAVENOUS | Status: DC
  Administered 2016-02-12 – 2016-02-16 (×9): 3 mL via INTRAVENOUS

## 2016-02-12 MED ORDER — FLEET ENEMA 7-19 GM/118ML RE ENEM: 1.0000 | ENEMA | Freq: Once | RECTAL | Status: DC | PRN

## 2016-02-12 MED ORDER — PHENOL 1.4 % MT LIQD: 1.0000 | OROMUCOSAL | Status: DC | PRN

## 2016-02-12 MED ORDER — VANCOMYCIN HCL 1000 MG IV SOLR
INTRAVENOUS | Status: AC
  Filled 2016-02-12: qty 1000

## 2016-02-12 MED ORDER — ONDANSETRON HCL 4 MG/2ML IJ SOLN: 4.0000 mg | INTRAMUSCULAR | Status: DC | PRN

## 2016-02-12 MED ORDER — ACETAMINOPHEN 325 MG PO TABS: 650.0000 mg | ORAL_TABLET | ORAL | Status: DC | PRN

## 2016-02-12 MED ORDER — FENTANYL 25 MCG/HR TD PT72
25.0000 ug | MEDICATED_PATCH | TRANSDERMAL | Status: DC
  Administered 2016-02-14: 25 ug via TRANSDERMAL
  Filled 2016-02-12: qty 1

## 2016-02-12 MED ORDER — SODIUM CHLORIDE 0.9 % IV SOLN
INTRAVENOUS | Status: DC
  Administered 2016-02-12: 125 mL/h via INTRAVENOUS
  Administered 2016-02-12: 12:00:00 via INTRAVENOUS

## 2016-02-12 MED ORDER — POLYETHYLENE GLYCOL 3350 17 G PO PACK
17.0000 g | PACK | Freq: Two times a day (BID) | ORAL | Status: DC
  Administered 2016-02-13 – 2016-02-16 (×7): 17 g via ORAL
  Filled 2016-02-12 (×8): qty 1

## 2016-02-12 MED ORDER — OXYCODONE-ACETAMINOPHEN 5-325 MG PO TABS
1.0000 | ORAL_TABLET | ORAL | Status: DC | PRN
  Administered 2016-02-12 – 2016-02-16 (×22): 2 via ORAL
  Filled 2016-02-12 (×22): qty 2

## 2016-02-12 MED ORDER — HYDROCODONE-ACETAMINOPHEN 5-325 MG PO TABS: 1.0000 | ORAL_TABLET | Freq: Four times a day (QID) | ORAL | Status: DC | PRN

## 2016-02-12 MED ORDER — METHOCARBAMOL 500 MG PO TABS
500.0000 mg | ORAL_TABLET | Freq: Four times a day (QID) | ORAL | Status: DC | PRN
  Administered 2016-02-15: 500 mg via ORAL
  Filled 2016-02-12: qty 1

## 2016-02-12 MED ORDER — CEFAZOLIN IN D5W 1 GM/50ML IV SOLN
1.0000 g | Freq: Three times a day (TID) | INTRAVENOUS | Status: DC
Start: 2016-02-12 — End: 2016-02-12

## 2016-02-12 MED ORDER — ACETAMINOPHEN 650 MG RE SUPP: 650.0000 mg | RECTAL | Status: DC | PRN

## 2016-02-12 MED ORDER — SODIUM CHLORIDE 0.9% FLUSH: 3.0000 mL | INTRAVENOUS | Status: DC | PRN

## 2016-02-12 MED ORDER — BISACODYL 10 MG RE SUPP: 10.0000 mg | Freq: Every day | RECTAL | Status: DC | PRN

## 2016-02-12 NOTE — Anesthesia Postprocedure Evaluation (Signed)
Anesthesia Post Note  Patient: Russell Gonzales  Procedure(s) Performed: Procedure(s) (LRB): T7-L2 Revision of Hardware (N/A)  Patient location during evaluation: PACU Anesthesia Type: General Level of consciousness: awake and alert Pain management: pain level controlled Vital Signs Assessment: post-procedure vital signs reviewed and stable Respiratory status: spontaneous breathing, nonlabored ventilation, respiratory function stable and patient connected to nasal cannula oxygen Cardiovascular status: blood pressure returned to baseline and stable Postop Assessment: no signs of nausea or vomiting Anesthetic complications: no    Last Vitals:  Vitals:   02/12/16 0300 02/12/16 0400  BP: 114/78 110/61  Pulse: (!) 109 (!) 114  Resp: 14 20  Temp:  36.4 C    Last Pain:  Vitals:   02/12/16 0400  TempSrc: Oral  PainSc:                  [name]

## 2016-02-12 NOTE — Evaluation (Signed)
Physical Therapy Evaluation Patient Details Name: Russell Gonzales MRN: [id number] DOB: 04-30-1951 Today's Date: 02/12/2016   History of Present Illness  65 yo male s/p Revision of fixation from T7-L2 with repositioning of right T8 and T9 pedicle screws and revision of connection to L2 to sacrum portion of fusion  Clinical Impression  Patient demonstrates deficits in functional mobility as indicated below. Will need continued skilled PT to address deficits and maixmize function. Will see as indicated and progress as tolerated. Recommend HHPT upon acute discharge.    Follow Up Recommendations Home health PT    Equipment Recommendations  None recommended by PT    Recommendations for Other Services       Precautions / Restrictions Precautions Precautions: Fall;Back Precaution Booklet Issued: Yes (comment) Precaution Comments: pt able to recall 3/3 back precautions without cuing Required Braces or Orthoses: Spinal Brace Spinal Brace: Thoracolumbosacral orthotic;Applied in sitting position Restrictions Weight Bearing Restrictions: No      Mobility  Bed Mobility Overal bed mobility: Needs Assistance Bed Mobility: Rolling;Sidelying to Sit Rolling: Supervision Sidelying to sit: Supervision       General bed mobility comments: increased time to perform, no physical assist required  Transfers Overall transfer level: Needs assistance Equipment used: Rolling walker (2 wheeled) Transfers: Sit to/from Stand Sit to Stand: Min assist         General transfer comment: assist for walker safety and balance  Ambulation/Gait Ambulation/Gait assistance: Supervision Ambulation Distance (Feet): 160 Feet Assistive device: Rolling walker (2 wheeled) Gait Pattern/deviations: Step-through pattern;Decreased stride length;Shuffle;Drifts right/left;Trunk flexed Gait velocity: decreased Gait velocity interpretation: Below normal speed for age/gender General Gait Details: reliance on RW for  support, increased reliance with fatigue  Stairs            Wheelchair Mobility    Modified Rankin (Stroke Patients Only)       Balance   Sitting-balance support: Bilateral upper extremity supported Sitting balance-Leahy Scale: Fair Sitting balance - Comments: sat EOB, dressing changed with nsg, tolerated increased time EOB without difficulty   Standing balance support: During functional activity Standing balance-Leahy Scale: Poor Standing balance comment: reliance of RW for support                             Pertinent Vitals/Pain Pain Assessment: 0-10 Pain Score: 5  Pain Location: low back Pain Descriptors / Indicators: Aching Pain Intervention(s): Monitored during session    Home Living Family/patient expects to be discharged to:: Private residence Living Arrangements: Spouse/significant other Available Help at Discharge: Family;Available PRN/intermittently Type of Home: Mobile home Home Access: Stairs to enter Entrance Stairs-Rails: Left Entrance Stairs-Number of Steps: concrete slab + 3 small steps + landing + threshold Home Layout: One level Home Equipment: Walker - 4 wheels;Cane - single point;Grab bars - tub/shower;Shower seat      Prior Function Level of Independence: Independent with assistive device(s)               Hand Dominance        Extremity/Trunk Assessment   Upper Extremity Assessment: Overall WFL for tasks assessed           Lower Extremity Assessment: Generalized weakness      Cervical / Trunk Assessment: Other exceptions (back surgery)  Communication   Communication: No difficulties  Cognition Arousal/Alertness: Awake/alert Behavior During Therapy: WFL for tasks assessed/performed Overall Cognitive Status: Within Functional Limits for tasks assessed  General Comments      Exercises        Assessment/Plan    PT Assessment Patient needs continued PT services  PT  Diagnosis Difficulty walking;Abnormality of gait;Generalized weakness;Acute pain   PT Problem List Decreased strength;Decreased activity tolerance;Decreased balance;Decreased mobility;Decreased knowledge of use of DME;Decreased knowledge of precautions;Cardiopulmonary status limiting activity;Obesity;Pain  PT Treatment Interventions DME instruction;Gait training;Stair training;Functional mobility training;Therapeutic activities;Therapeutic exercise;Balance training;Neuromuscular re-education;Patient/family education   PT Goals (Current goals can be found in the Care Plan section) Acute Rehab PT Goals Patient Stated Goal: to return home PT Goal Formulation: With patient Time For Goal Achievement: 01/26/16 Potential to Achieve Goals: Good    Frequency Min 5X/week   Barriers to discharge Decreased caregiver support;Inaccessible home environment      Co-evaluation               End of Session Equipment Utilized During Treatment: Back brace Activity Tolerance: Patient tolerated treatment well Patient left: in chair;with call bell/phone within reach Nurse Communication: Mobility status         Time: [id number] PT Time Calculation (min) (ACUTE ONLY): 27 min   Charges:   PT Evaluation $PT Eval Moderate Complexity: 1 Procedure PT Treatments $Gait Training: 8-22 mins   PT G CodesDuncan Dull February 13, 2016, 1:18 PM Alben Deeds, [place] DPT  [phone]

## 2016-02-12 NOTE — Op Note (Signed)
Date of surgery: 02/11/2016 Preoperative diagnosis: Loss of fixation of T7-L2 posterior spinal stabilization with marked pain Postoperative diagnosis: Loss of fixation of T7-L2 posterior spinal stabilization with marked pain Procedure: Revision of fixation from T7-L2 with repositioning of right T8 and T9 pedicle screws and revision of connection to L2 to sacrum portion of fusion. Surgeon: [name] First assistant: Erline Levine M.D. Anesthesia: Gen. 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 He underwent surgical intervention just under a month ago. He felt well until a few days ago when he had a substantial increase in pain in his flank with radiation down his side is found that lost fixation on the CT scan. It is also noted that the right-sided T8 and T9 screws are medially displaced. He's taken to the operating room to undergo surgical revision.  Procedure: The patient was brought to the operating room supine on a stretcher. After the smooth induction of general endotracheal anesthesia, he was turned prone. The back was prepped with alcohol and ChloraPrep and then draped in a sterile fashion. His previous incision had been reopened. Subcutaneous pockets of fluid were encountered and these were each drained. Then the incision was opened down to the hardware. The hardware was identified in the midportion and then traced to the superior most screw and down to the inferior connection. On the right side there rod once exposed had the caps loosened and it was removed is clearly disconnected from the attachment to the L2 fixation. Is decided that to reattach the rod and continue the construct a side car connector would be used. In the area was prepared on the rod below the L2 screw in this area was cleared for side car connector. This was attached to the rod. Then the T8 and T9 screws on the right side were redirected more  laterally so as to avoid involvement of the vertebral body. As the screws were removed no spinal fluid was encountered. The screws once redirected fell more in line such that a straight rod with a singular band could be used to fit the construct from T7 to the L2 region. This was a 6 mm rod which was slightly thicker than what was used before. Then attention was turned to the left side there rod here was loosened and removed. It is felt that a side car connector would give better fixation you know there is ample rod left on the left side for attachment we used side car connector. Here again the screws lined up better and a singular straight rod with one been could be fitted between the side car connector at L2 and the T7 vertebrae. This was placed after contouring the rod appropriately and the system was locked down. A transverse connector was then applied. Once all the connections were placed all the screws were torqued down into the final position. The wound was carefully inspected and irrigated out and closed with 1 g of vancomycin powder over the tissues and the fascia. Blood loss is estimated at some 500 mL. No Cell Saver blood was returned to the patient. A dry sterile dressing was applied after the wound was closed in layers with #1 Vicryl in the lumbar dorsal fascia and the thoracic of dorsal fascia 2-0 Vicryl in the subcutaneous tissues and 3-0 Vicryl subcuticularly. Surgical staples were placed in the skin. A BD dressing was applied to the back.

## 2016-02-12 NOTE — Transfer of Care (Signed)
Immediate Anesthesia Transfer of Care Note  Patient: Russell Gonzales  Procedure(s) Performed: Procedure(s): T7-L2 Revision of Hardware (N/A)  Patient Location: ICU  Anesthesia Type:General  Level of Consciousness: awake, sedated, patient cooperative and responds to stimulation  Airway & Oxygen Therapy: Patient Spontanous Breathing and Patient connected to nasal cannula oxygen  Post-op Assessment: Report given to RN, Post -op Vital signs reviewed and stable and Patient moving all extremities X 4  Post vital signs: Reviewed and stable  Last Vitals:  Vitals:   02/11/16 1815 02/11/16 1830  BP: 151/99 166/87  Pulse: 78 92  Resp: 20 16  Temp:      Last Pain:  Vitals:   02/11/16 2044  TempSrc:   PainSc: 8          Complications: No apparent anesthesia complications

## 2016-02-12 NOTE — Progress Notes (Signed)
Orthopedic Tech Progress Note Patient Details:  Russell Gonzales 1951/02/03 [id number] Called in bio-tech brace order; spoke with [name] Patient ID: [name], male   DOB: 05/14/1951, 65 y.o.   MRN: [id number]   Hildred Priest 02/12/2016, 9:52 AM

## 2016-02-12 NOTE — Progress Notes (Signed)
Subjective: Patient reports doing well  Objective: Vital signs in last 24 hours: Temp:  [97.5 F (36.4 C)-98.6 F (37 C)] 98.6 F (37 C) (09/02 0800) Pulse Rate:  [64-131] 97 (09/02 0700) Resp:  [8-24] 22 (09/02 0700) BP: (99-186)/(56-107) 111/97 (09/02 0700) SpO2:  [93 %-100 %] 95 % (09/02 0700) Weight:  [103.9 kg (229 lb)] 103.9 kg (229 lb) (09/01 1142)  Intake/Output from previous day: 09/01 0701 - 09/02 0700 In: 2875 [I.V.:2625; IV Piggyback:250] Out: 820 [Urine:320; Blood:500] Intake/Output this shift: No intake/output data recorded.  Physical Exam: Strength full, minimal drainage on dressing.  Lab Results:  Recent Labs  02/11/16 1442 02/12/16 0323  WBC 6.0 11.0*  HGB 9.7* 8.3*  HCT 29.4* 26.1*  PLT 420* 331   BMET  Recent Labs  02/11/16 1442 02/12/16 0323  NA 136 135  K 4.7 4.4  CL 102 101  CO2 25 24  GLUCOSE 104* 200*  BUN 17 19  CREATININE 1.32* 1.52*  CALCIUM 9.4 8.7*    Studies/Results: Dg Thoracic Spine 2 View  Result Date: 02/12/2016 CLINICAL DATA:  T7 through L2 revision of hardware. EXAM: THORACIC SPINE 2 VIEWS ; DG C-ARM 61-120 MIN COMPARISON:  CT thoracic spine 02/11/2016. Thoracic spine fluoroscopy 01/18/2016. FINDINGS: Intraoperative fluoroscopy is utilized for surgical control purposes during spine procedure. Fluoroscopy time is recorded at 17 seconds. Dose is not indicated. Three spot fluoroscopic images are obtained. Images obtained with small field of view, limiting ability to accurately count the levels involved. There is posterior rod fixation of the thoracolumbar spine with underlying thoracolumbar scoliosis. Visualized hardware components appear intact. On views obtained, the rods appear to be located within the adjoining brackets. IMPRESSION: Intraoperative fluoroscopy utilized for surgical control purposes during revision of posterior rod and screw fixation of the thoracolumbar spine. Electronically Signed   By: Lucienne Capers M.D.    On: 02/12/2016 00:33   Ct Thoracic Spine W Contrast  Result Date: 02/11/2016 CLINICAL DATA:  Status post back surgery January 18, 2016, released from rehab on February 04, 2016. Uncontrollable pain, nausea and vomiting. EXAM: CT THORACIC AND LUMBAR SPINE WITH CONTRAST TECHNIQUE: Multidetector CT imaging of the thoracic and lumbar spine was performed with contrast. Multiplanar CT image reconstructions were also generated. CONTRAST:  100 cc Isovue 300 COMPARISON:  Thoracolumbar spine CT August 10th 2017 FINDINGS: SEGMENTATION: Transitional anatomy, lumbarized RIGHT S1 vertebral body. ALIGNMENT: Severe S-type scoliosis on this nonweightbearing examination, dextro apex at approximately T8-9, levo apex at approximately L3-4. Straightened thoracic kyphosis and lumbar lordosis. OSSEOUS STRUCTURES: T7 through T9 RIGHT and T7 through T10 LEFT pedicle screws. The RIGHT T8 and T9 pedicle screws extend through medial pedicle cortex partially effacing the lateral recess which could affect the traversing RIGHT T9 and T10 nerves. LEFT T8 and T9 pedicle screws are lateral to the pedicle. Nondisplaced fracture through T10 RIGHT superior articular facet. Non incorporated posterior bone graft material. The bilateral lumbar rods have pulled out of the joining bracket at L1-2. The RIGHT lumbar rod is now outside the joining bracket. The LEFT lumbar rod is at the joining bracket opening. LEFT L2 pedicle screw is medially located may affect the traversing LEFT L3 nerve. Well seated L3 pedicle screws. Well-seated LEFT L5, bilateral S1 and S-2 pedicle screws. Hardware is intact. Status post recent T10-11 through L1-2 laminectomies. Incorporated posterior bone graft material L1-2 through upper sacral spine. No destructive bony lesions. No definite osseous or intradiscal enhancement though limited assessment by CT. SOFT TISSUES: 4.6 x 4.2 x  14.1 cm (transverse by AP by CC) rim and enhancing fluid collection within the paraspinal muscles from  T4-5 through T10-11. A second 2.9 x 3.8 x 9.5 cm fluid collection from T9-10 - T12-L1 within the laminectomy surgical bed. Limited assessment for thecal sac effacement physis CT. The fluid collections do not clearly communicate. Dependent atelectasis. The heart appears mildly enlarged. Mild coronary artery calcifications. 3.3 cm fusiform aortoiliac ectasia. IMPRESSION: Status post T7 through S2 posterior instrumentation and decompression of varying ages. New hardware failure; the RIGHT lumbar rod has backed out of joining bracket at L1-2, the LEFT lumbar rod is at the joining bracket opening. New 2.9 x 3.8 x 9.5 cm fluid collection within the T9-10 through T12-L1 laminectomy surgical bed most compatible with abscess. 4.6 x 4.2 x 14.1 cm mid thoracic paraspinal fluid collection/abscess. Aortoiliac ectasia. Recommend followup by ultrasound in 3 years. This recommendation follows ACR consensus guidelines: White Paper of the ACR Incidental Findings Committee II on Vascular Findings. J Am Coll Radiol 2013; 10:789-794 Electronically Signed   By: Elon Alas M.D.   On: 02/11/2016 19:12   Ct Lumbar Spine W Contrast  Result Date: 02/11/2016 CLINICAL DATA:  Status post back surgery January 18, 2016, released from rehab on February 04, 2016. Uncontrollable pain, nausea and vomiting. EXAM: CT THORACIC AND LUMBAR SPINE WITH CONTRAST TECHNIQUE: Multidetector CT imaging of the thoracic and lumbar spine was performed with contrast. Multiplanar CT image reconstructions were also generated. CONTRAST:  100 cc Isovue 300 COMPARISON:  Thoracolumbar spine CT August 10th 2017 FINDINGS: SEGMENTATION: Transitional anatomy, lumbarized RIGHT S1 vertebral body. ALIGNMENT: Severe S-type scoliosis on this nonweightbearing examination, dextro apex at approximately T8-9, levo apex at approximately L3-4. Straightened thoracic kyphosis and lumbar lordosis. OSSEOUS STRUCTURES: T7 through T9 RIGHT and T7 through T10 LEFT pedicle screws. The RIGHT  T8 and T9 pedicle screws extend through medial pedicle cortex partially effacing the lateral recess which could affect the traversing RIGHT T9 and T10 nerves. LEFT T8 and T9 pedicle screws are lateral to the pedicle. Nondisplaced fracture through T10 RIGHT superior articular facet. Non incorporated posterior bone graft material. The bilateral lumbar rods have pulled out of the joining bracket at L1-2. The RIGHT lumbar rod is now outside the joining bracket. The LEFT lumbar rod is at the joining bracket opening. LEFT L2 pedicle screw is medially located may affect the traversing LEFT L3 nerve. Well seated L3 pedicle screws. Well-seated LEFT L5, bilateral S1 and S-2 pedicle screws. Hardware is intact. Status post recent T10-11 through L1-2 laminectomies. Incorporated posterior bone graft material L1-2 through upper sacral spine. No destructive bony lesions. No definite osseous or intradiscal enhancement though limited assessment by CT. SOFT TISSUES: 4.6 x 4.2 x 14.1 cm (transverse by AP by CC) rim and enhancing fluid collection within the paraspinal muscles from T4-5 through T10-11. A second 2.9 x 3.8 x 9.5 cm fluid collection from T9-10 - T12-L1 within the laminectomy surgical bed. Limited assessment for thecal sac effacement physis CT. The fluid collections do not clearly communicate. Dependent atelectasis. The heart appears mildly enlarged. Mild coronary artery calcifications. 3.3 cm fusiform aortoiliac ectasia. IMPRESSION: Status post T7 through S2 posterior instrumentation and decompression of varying ages. New hardware failure; the RIGHT lumbar rod has backed out of joining bracket at L1-2, the LEFT lumbar rod is at the joining bracket opening. New 2.9 x 3.8 x 9.5 cm fluid collection within the T9-10 through T12-L1 laminectomy surgical bed most compatible with abscess. 4.6 x 4.2 x 14.1 cm  mid thoracic paraspinal fluid collection/abscess. Aortoiliac ectasia. Recommend followup by ultrasound in 3 years. This  recommendation follows ACR consensus guidelines: White Paper of the ACR Incidental Findings Committee II on Vascular Findings. J Am Coll Radiol 2013; 10:789-794 Electronically Signed   By: Elon Alas M.D.   On: 02/11/2016 19:12   Dg C-arm 1-60 Min  Result Date: 02/12/2016 CLINICAL DATA:  T7 through L2 revision of hardware. EXAM: THORACIC SPINE 2 VIEWS ; DG C-ARM 61-120 MIN COMPARISON:  CT thoracic spine 02/11/2016. Thoracic spine fluoroscopy 01/18/2016. FINDINGS: Intraoperative fluoroscopy is utilized for surgical control purposes during spine procedure. Fluoroscopy time is recorded at 17 seconds. Dose is not indicated. Three spot fluoroscopic images are obtained. Images obtained with small field of view, limiting ability to accurately count the levels involved. There is posterior rod fixation of the thoracolumbar spine with underlying thoracolumbar scoliosis. Visualized hardware components appear intact. On views obtained, the rods appear to be located within the adjoining brackets. IMPRESSION: Intraoperative fluoroscopy utilized for surgical control purposes during revision of posterior rod and screw fixation of the thoracolumbar spine. Electronically Signed   By: Lucienne Capers M.D.   On: 02/12/2016 00:33    Assessment/Plan: Patient is doing well.  Pain much improved.  Mobilize with PT.  Continue to observe in ICU.    LOS: 1 day    Peggyann Shoals, MD 02/12/2016, 9:16 AM

## 2016-02-12 NOTE — H&P (Signed)
PULMONARY / CRITICAL CARE MEDICINE   Name: Russell Gonzales MRN: [id number] DOB: 09-09-1950    ADMISSION DATE:  02/11/2016 CONSULTATION DATE:  February 12, 2016  REFERRING MD:  Dr. 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 His last operation last month was complicated by acute blood loss anemia with shock, AKI, etc. He presented to the ED with severe back pain and was found to have need for hardware removal. Given his last complications, PCCM was consulted for management.  PAST MEDICAL HISTORY :  He  has a past medical history of Arthritis; Chronic lower back pain; Fatty liver; GERD (gastroesophageal reflux disease); Hemorrhoids; Hyperlipidemia; Hypertension; Low iron; Prediabetes; Seasonal allergies; and Shortness of breath dyspnea.  PAST SURGICAL HISTORY: He  has a past surgical history that includes Anterior cervical decomp/discectomy fusion (05/2008); Back surgery; Colonoscopy w/ biopsies and polypectomy; Polypectomy (2016); ostate biopsy (2012); Ankle fracture surgery (Left, 1972); Fracture surgery; Knee arthroscopy (Left, 1970s); Posterior lumbar fusion (04/2008); Thoracic laminectomy (08/11/2015); Lumbar laminectomy/decompression microdiscectomy (N/A, 08/11/2015); and Posterior lumbar fusion 4 level (N/A, 01/18/2016).  Allergies  Allergen Reactions  . Aspirin Hives  . Bc Fast Pain [Aspirin-Caffeine] Hives  . Ancef [Cefazolin] Rash    RASH  . Tape Rash    Surgical Tape- 2009ish  Back surgery - broke out- rash    No current facility-administered medications on file prior to encounter.    Current Outpatient Prescriptions on File Prior to Encounter  Medication Sig  . atorvastatin (LIPITOR) 20 MG tablet Take 1 tablet (20 mg total) by mouth daily.  . famotidine (PEPCID) 20 MG tablet Take 1 tablet (20 mg  total) by mouth 2 (two) times daily.  . fentaNYL (DURAGESIC - DOSED MCG/HR) 25 MCG/HR patch Place 1 patch (25 mcg total) onto the skin every 3 (three) days.  Marland Kitchen gabapentin (NEURONTIN) 600 MG tablet Take 1 tablet (600 mg total) by mouth 3 (three) times daily.  Marland Kitchen HYDROcodone-acetaminophen (NORCO/VICODIN) 5-325 MG tablet Take 1 tablet by mouth every 6 (six) hours as needed for moderate pain (breakthrough pain).  . methocarbamol (ROBAXIN) 750 MG tablet Take 1 tablet (750 mg total) by mouth 4 (four) times daily as needed for muscle spasms.  . Multiple Vitamins-Minerals (MULTIVITAMIN ADULT PO) Take 1 tablet by mouth daily.   . polyethylene glycol (MIRALAX / GLYCOLAX) packet Take 17 g by mouth 2 (two) times daily.  . traMADol (ULTRAM) 50 MG tablet Take 1 tablet (50 mg total) by mouth every 6 (six) hours as needed for moderate pain.  . fish oil-omega-3 fatty acids 1000 MG capsule Take 1 g by mouth 2 (two) times daily.      FAMILY HISTORY:  His indicated that his mother is deceased. He indicated that his father is deceased. He indicated that his brother is deceased. He indicated that the status of his neg hx is unknown.    SOCIAL HISTORY: He  reports that he has quit smoking. His smoking use included Cigars. He has a 15.00 pack-year smoking history. He has never used smokeless tobacco. He reports that he drinks about 21.0 oz of alcohol per week . He reports that he uses drugs, including Other-see comments.  REVIEW OF SYSTEMS:   All symptoms were reviewed and are negative except as mentioned in the HPI.  SUBJECTIVE:  Per  HPI  VITAL SIGNS: BP 114/78   Pulse (!) 109   Temp 98.2 F (36.8 C)   Resp 14   SpO2 96%   HEMODYNAMICS:    VENTILATOR SETTINGS:    INTAKE / OUTPUT: No intake/output data recorded.  PHYSICAL EXAMINATION: General:  Middle aged man lying in bed  Neuro:  Awake, alert HEENT:  moist mucus membrane Cardiovascular:  Tachycardic Lungs:  CTA Abdomen:   Soft Musculoskeletal:  Normal Joints Skin:  No rashes  LABS:  BMET  Recent Labs Lab 02/11/16 1442  NA 136  K 4.7  CL 102  CO2 25  BUN 17  CREATININE 1.32*  GLUCOSE 104*    Electrolytes  Recent Labs Lab 02/11/16 1442  CALCIUM 9.4    CBC  Recent Labs Lab 02/11/16 1442  WBC 6.0  HGB 9.7*  HCT 29.4*  PLT 420*    Coag's No results for input(s): APTT, INR in the last 168 hours.  Sepsis Markers  Recent Labs Lab 02/11/16 1450  LATICACIDVEN 1.68    ABG No results for input(s): PHART, PCO2ART, PO2ART in the last 168 hours.  Liver Enzymes  Recent Labs Lab 02/11/16 1442  AST 30  ALT 34  ALKPHOS 108  BILITOT 0.6  ALBUMIN 3.5    Cardiac Enzymes No results for input(s): TROPONINI, PROBNP in the last 168 hours.  Glucose No results for input(s): GLUCAP in the last 168 hours.  Imaging Dg Thoracic Spine 2 View  Result Date: 02/12/2016 CLINICAL DATA:  T7 through L2 revision of hardware. EXAM: THORACIC SPINE 2 VIEWS ; DG C-ARM 61-120 MIN COMPARISON:  CT thoracic spine 02/11/2016. Thoracic spine fluoroscopy 01/18/2016. FINDINGS: Intraoperative fluoroscopy is utilized for surgical control purposes during spine procedure. Fluoroscopy time is recorded at 17 seconds. Dose is not indicated. Three spot fluoroscopic images are obtained. Images obtained with small field of view, limiting ability to accurately count the levels involved. There is posterior rod fixation of the thoracolumbar spine with underlying thoracolumbar scoliosis. Visualized hardware components appear intact. On views obtained, the rods appear to be located within the adjoining brackets. IMPRESSION: Intraoperative fluoroscopy utilized for surgical control purposes during revision of posterior rod and screw fixation of the thoracolumbar spine. Electronically Signed   By: Lucienne Capers M.D.   On: 02/12/2016 00:33   Ct Thoracic Spine W Contrast  Result Date: 02/11/2016 CLINICAL DATA:  Status post back  surgery January 18, 2016, released from rehab on February 04, 2016. Uncontrollable pain, nausea and vomiting. EXAM: CT THORACIC AND LUMBAR SPINE WITH CONTRAST TECHNIQUE: Multidetector CT imaging of the thoracic and lumbar spine was performed with contrast. Multiplanar CT image reconstructions were also generated. CONTRAST:  100 cc Isovue 300 COMPARISON:  Thoracolumbar spine CT August 10th 2017 FINDINGS: SEGMENTATION: Transitional anatomy, lumbarized RIGHT S1 vertebral body. ALIGNMENT: Severe S-type scoliosis on this nonweightbearing examination, dextro apex at approximately T8-9, levo apex at approximately L3-4. Straightened thoracic kyphosis and lumbar lordosis. OSSEOUS STRUCTURES: T7 through T9 RIGHT and T7 through T10 LEFT pedicle screws. The RIGHT T8 and T9 pedicle screws extend through medial pedicle cortex partially effacing the lateral recess which could affect the traversing RIGHT T9 and T10 nerves. LEFT T8 and T9 pedicle screws are lateral to the pedicle. Nondisplaced fracture through T10 RIGHT superior articular facet. Non incorporated posterior bone graft material. The bilateral lumbar rods have pulled out of the joining bracket at L1-2. The RIGHT lumbar rod is now outside the joining bracket. The LEFT lumbar rod is at the joining bracket  opening. LEFT L2 pedicle screw is medially located may affect the traversing LEFT L3 nerve. Well seated L3 pedicle screws. Well-seated LEFT L5, bilateral S1 and S-2 pedicle screws. Hardware is intact. Status post recent T10-11 through L1-2 laminectomies. Incorporated posterior bone graft material L1-2 through upper sacral spine. No destructive bony lesions. No definite osseous or intradiscal enhancement though limited assessment by CT. SOFT TISSUES: 4.6 x 4.2 x 14.1 cm (transverse by AP by CC) rim and enhancing fluid collection within the paraspinal muscles from T4-5 through T10-11. A second 2.9 x 3.8 x 9.5 cm fluid collection from T9-10 - T12-L1 within the laminectomy  surgical bed. Limited assessment for thecal sac effacement physis CT. The fluid collections do not clearly communicate. Dependent atelectasis. The heart appears mildly enlarged. Mild coronary artery calcifications. 3.3 cm fusiform aortoiliac ectasia. IMPRESSION: Status post T7 through S2 posterior instrumentation and decompression of varying ages. New hardware failure; the RIGHT lumbar rod has backed out of joining bracket at L1-2, the LEFT lumbar rod is at the joining bracket opening. New 2.9 x 3.8 x 9.5 cm fluid collection within the T9-10 through T12-L1 laminectomy surgical bed most compatible with abscess. 4.6 x 4.2 x 14.1 cm mid thoracic paraspinal fluid collection/abscess. Aortoiliac ectasia. Recommend followup by ultrasound in 3 years. This recommendation follows ACR consensus guidelines: White Paper of the ACR Incidental Findings Committee II on Vascular Findings. J Am Coll Radiol 2013; 10:789-794 Electronically Signed   By: Elon Alas M.D.   On: 02/11/2016 19:12   Ct Lumbar Spine W Contrast  Result Date: 02/11/2016 CLINICAL DATA:  Status post back surgery January 18, 2016, released from rehab on February 04, 2016. Uncontrollable pain, nausea and vomiting. EXAM: CT THORACIC AND LUMBAR SPINE WITH CONTRAST TECHNIQUE: Multidetector CT imaging of the thoracic and lumbar spine was performed with contrast. Multiplanar CT image reconstructions were also generated. CONTRAST:  100 cc Isovue 300 COMPARISON:  Thoracolumbar spine CT August 10th 2017 FINDINGS: SEGMENTATION: Transitional anatomy, lumbarized RIGHT S1 vertebral body. ALIGNMENT: Severe S-type scoliosis on this nonweightbearing examination, dextro apex at approximately T8-9, levo apex at approximately L3-4. Straightened thoracic kyphosis and lumbar lordosis. OSSEOUS STRUCTURES: T7 through T9 RIGHT and T7 through T10 LEFT pedicle screws. The RIGHT T8 and T9 pedicle screws extend through medial pedicle cortex partially effacing the lateral recess which  could affect the traversing RIGHT T9 and T10 nerves. LEFT T8 and T9 pedicle screws are lateral to the pedicle. Nondisplaced fracture through T10 RIGHT superior articular facet. Non incorporated posterior bone graft material. The bilateral lumbar rods have pulled out of the joining bracket at L1-2. The RIGHT lumbar rod is now outside the joining bracket. The LEFT lumbar rod is at the joining bracket opening. LEFT L2 pedicle screw is medially located may affect the traversing LEFT L3 nerve. Well seated L3 pedicle screws. Well-seated LEFT L5, bilateral S1 and S-2 pedicle screws. Hardware is intact. Status post recent T10-11 through L1-2 laminectomies. Incorporated posterior bone graft material L1-2 through upper sacral spine. No destructive bony lesions. No definite osseous or intradiscal enhancement though limited assessment by CT. SOFT TISSUES: 4.6 x 4.2 x 14.1 cm (transverse by AP by CC) rim and enhancing fluid collection within the paraspinal muscles from T4-5 through T10-11. A second 2.9 x 3.8 x 9.5 cm fluid collection from T9-10 - T12-L1 within the laminectomy surgical bed. Limited assessment for thecal sac effacement physis CT. The fluid collections do not clearly communicate. Dependent atelectasis. The heart appears mildly enlarged. Mild coronary artery calcifications.  3.3 cm fusiform aortoiliac ectasia. IMPRESSION: Status post T7 through S2 posterior instrumentation and decompression of varying ages. New hardware failure; the RIGHT lumbar rod has backed out of joining bracket at L1-2, the LEFT lumbar rod is at the joining bracket opening. New 2.9 x 3.8 x 9.5 cm fluid collection within the T9-10 through T12-L1 laminectomy surgical bed most compatible with abscess. 4.6 x 4.2 x 14.1 cm mid thoracic paraspinal fluid collection/abscess. Aortoiliac ectasia. Recommend followup by ultrasound in 3 years. This recommendation follows ACR consensus guidelines: White Paper of the ACR Incidental Findings Committee II on  Vascular Findings. J Am Coll Radiol 2013; 10:789-794 Electronically Signed   By: Elon Alas M.D.   On: 02/11/2016 19:12   Dg C-arm 1-60 Min  Result Date: 02/12/2016 CLINICAL DATA:  T7 through L2 revision of hardware. EXAM: THORACIC SPINE 2 VIEWS ; DG C-ARM 61-120 MIN COMPARISON:  CT thoracic spine 02/11/2016. Thoracic spine fluoroscopy 01/18/2016. FINDINGS: Intraoperative fluoroscopy is utilized for surgical control purposes during spine procedure. Fluoroscopy time is recorded at 17 seconds. Dose is not indicated. Three spot fluoroscopic images are obtained. Images obtained with small field of view, limiting ability to accurately count the levels involved. There is posterior rod fixation of the thoracolumbar spine with underlying thoracolumbar scoliosis. Visualized hardware components appear intact. On views obtained, the rods appear to be located within the adjoining brackets. IMPRESSION: Intraoperative fluoroscopy utilized for surgical control purposes during revision of posterior rod and screw fixation of the thoracolumbar spine. Electronically Signed   By: Lucienne Capers M.D.   On: 02/12/2016 00:33    SIGNIFICANT EVENTS: Spine fusion.  LINES/TUBES: PIV x2 Foley   ASSESSMENT / PLAN:  PULMONARY A: No active issues P:    CARDIOVASCULAR A:  Tachycardia P:  Due to hypovolemia, will give 2L  RENAL A:   Labs pending P:    GASTROINTESTINAL A:   Need for nutrition P:   Can take PO if doing well in the AM.  HEMATOLOGIC A:   Hx of blood loss anemia P:  Check CBC, transfusion for Hb <7.0  INFECTIOUS A:   No active concerns. P:    ENDOCRINE A:   No active concerns   P:    NEUROLOGIC A:   S/p fusion P:   RASS goal: 0   FAMILY  - Updates:   - Inter-disciplinary family meet or Palliative Care meeting due by:  02/20/16  CRITICAL CARE Performed by: Luz Brazen   Total critical care time: 40 minutes  Critical care time was exclusive of separately  billable procedures and treating other patients.  Critical care was necessary to treat or prevent imminent or life-threatening deterioration.  Critical care was time spent personally by me on the following activities: development of treatment plan with patient and/or surrogate as well as nursing, discussions with consultants, evaluation of patient's response to treatment, examination of patient, obtaining history from patient or surrogate, ordering and performing treatments and interventions, ordering and review of laboratory studies, ordering and review of radiographic studies, pulse oximetry and re-evaluation of patient's condition.   Pulmonary and [place] Pager: [phone]  02/12/2016, 3:33 AM

## 2016-02-13 DIAGNOSIS — M5104 Intervertebral disc disorders with myelopathy, thoracic region: Secondary | ICD-10-CM

## 2016-02-13 LAB — URINE CULTURE: Organism ID, Bacteria: 10000

## 2016-02-13 LAB — BASIC METABOLIC PANEL
ANION GAP: 5 (ref 5–15)
BUN: 21 mg/dL — ABNORMAL HIGH (ref 6–20)
CALCIUM: 7.9 mg/dL — AB (ref 8.9–10.3)
CO2: 25 mmol/L (ref 22–32)
CREATININE: 1.34 mg/dL — AB (ref 0.61–1.24)
Chloride: 106 mmol/L (ref 101–111)
GFR, EST NON AFRICAN AMERICAN: 54 mL/min — AB (ref >60)
Glucose, Bld: 108 mg/dL — ABNORMAL HIGH (ref 65–99)
Potassium: 3.9 mmol/L (ref 3.5–5.1)
SODIUM: 136 mmol/L (ref 135–145)

## 2016-02-13 LAB — CBC
HEMATOCRIT: 21.8 % — AB (ref 39.0–52.0)
HEMOGLOBIN: 7.2 g/dL — AB (ref 13.0–17.0)
MCH: 31.3 pg (ref 26.0–34.0)
MCHC: 33 g/dL (ref 30.0–36.0)
MCV: 94.8 fL (ref 78.0–100.0)
Platelets: 227 10*3/uL (ref 150–400)
RBC: 2.3 MIL/uL — ABNORMAL LOW (ref 4.22–5.81)
RDW: 14.3 % (ref 11.5–15.5)
WBC: 7.7 10*3/uL (ref 4.0–10.5)

## 2016-02-13 LAB — PREPARE RBC (CROSSMATCH)

## 2016-02-13 MED ORDER — SODIUM CHLORIDE 0.9 % IV SOLN: Freq: Once | INTRAVENOUS | Status: DC

## 2016-02-13 NOTE — Progress Notes (Signed)
No acute events Receiving blood transfusion Moving legs well Stable Transfer to floor

## 2016-02-13 NOTE — Progress Notes (Signed)
PULMONARY / CRITICAL CARE MEDICINE   Name: KIARI DENLEY MRN: [id number] DOB: Jan 18, 1951    ADMISSION DATE:  02/11/2016 CONSULTATION DATE:  February 13, 2016  REFERRING MD:  Dr. Volanda Napoleon  CHIEF COMPLAINT:  Back Pain  HISTORY OF PRESENT ILLNESS:   Mr. 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 His last operation last month was complicated by acute blood loss anemia with shock, AKI, etc. He presented to the ED with severe back pain and was found to have need for hardware removal. Given his last complications, PCCM was consulted for management.   SUBJECTIVE:  Anemic this am with Hgb <7.  Currently getting 1 unit PRBC. No obvious bleeding per RN.  Hemodynamically stable.  C/o mild surgical back pain. Denies abd pain.   VITAL SIGNS: BP 140/74 (BP Location: Left Arm)   Pulse (!) 129   Temp 98.4 F (36.9 C) (Axillary)   Resp (!) 25   SpO2 100%    INTAKE / OUTPUT: I/O last 3 completed shifts: In: [id number] [P.O.:1440; I.V.:5753; IV Piggyback:650] Out: PC:[id number]; Blood:500]  PHYSICAL EXAMINATION: General:  Middle aged man lying in bed, NAD eating breakfast  Neuro:  Awake, alert HEENT:  moist mucus membrane Cardiovascular:  Tachycardic - mild  Lungs: resps even non labored on RA, clear  Abdomen:  Soft Musculoskeletal:  Warm and dry, no edema  Skin:  No rashes  LABS:  BMET  Recent Labs Lab 02/11/16 1442 02/12/16 0323 02/13/16 0254  NA 136 135 136  K 4.7 4.4 3.9  CL 102 101 106  CO2 25 24 25   BUN 17 19 21*  CREATININE 1.32* 1.52* 1.34*  GLUCOSE 104* 200* 108*    Electrolytes  Recent Labs Lab 02/11/16 1442 02/12/16 0323 02/13/16 0254  CALCIUM 9.4 8.7* 7.9*    CBC  Recent Labs Lab 02/12/16 0323 02/12/16 0917 02/13/16 0254  WBC 11.0* 8.5 9.6  HGB 8.3* 7.4* 6.7*  HCT 26.1* 22.9* 20.5*  PLT 331 293 261    Coag's  Recent Labs Lab  02/12/16 0917  INR 1.16    Sepsis Markers  Recent Labs Lab 02/11/16 1450  LATICACIDVEN 1.68    ABG No results for input(s): PHART, PCO2ART, PO2ART in the last 168 hours.  Liver Enzymes  Recent Labs Lab 02/11/16 1442 02/12/16 0917  AST 30 37  ALT 34 34  ALKPHOS 108 86  BILITOT 0.6 0.4  ALBUMIN 3.5 2.9*    Cardiac Enzymes No results for input(s): TROPONINI, PROBNP in the last 168 hours.  Glucose No results for input(s): GLUCAP in the last 168 hours.  Imaging No results found.  SIGNIFICANT EVENTS: 9/1 Spine fusion.  LINES/TUBES: PIV x2 Foley   ASSESSMENT / PLAN:  PULMONARY A: No active issues P:   Pulmonary hygiene  Mobilize when ok with nsgy   CARDIOVASCULAR A:  Tachycardia - improving Hx HTN P:  Tele  Volume as below   RENAL A:   AKI - mild. Improving.  P:   F/u chem   GASTROINTESTINAL A:   Constipation  P:   Continue miralax, senna   HEMATOLOGIC A:   Hx of severe blood loss anemia Mild anemia - hgb 8.3 ->7.4-> 6.7 P:  Transfuse 1 unit PRBC 9/3 F/u cbc this pm and in am  No obvious s/s bleeding   INFECTIOUS A:   No active concerns. P:  ENDOCRINE A:   No active concerns   P:    NEUROLOGIC A:   S/p fusion P:   Per neurosurgery  Pain control    FAMILY  - Updates: pt and family updated 9/3  - Inter-disciplinary family meet or Palliative Care meeting due by:  02/20/16   Nickolas Madrid, NP 02/13/2016  8:51 AM Pager: [phone] or [phone]  Attending note: I have seen and examined the patient with nurse practitioner/resident and agree with the note. History, labs and imaging reviewed.  65 Y/o with severe scoliosis, multiple spine surgeries. Admitted for hardware removal.  He has received 1 unit PRBC today AM but has remained stable post op. Plan is to transfer him to floor.  PCCM will be available as needed. [facility] hospitalists can take over for any medical issues  Marshell Garfinkel MD [place]  Pulmonary and Critical Care Pager [phone] If no answer or after 3pm call: [phone] 02/13/2016, 10:59 AM

## 2016-02-13 NOTE — Progress Notes (Signed)
[employer] Progress Note Patient Name: Russell Gonzales DOB: 1950/11/11 MRN: [id number]   Date of Service  02/13/2016  HPI/Events of Note  Patient is anemic with hemoglobin less than 7 postop. Spoke with nurse. No obvious bleeding. Blood pressure 130/100, heart rate 99.   eICU Interventions  Transfuse 1 unit packed red blood cells.  Nurse had paged the neurosurgeon. Told nurse to keep neurosurgeon in formed      Intervention Category Major Interventions: Other:  [place] 02/13/2016, 4:27 AM

## 2016-02-13 NOTE — Progress Notes (Signed)
CRITICAL VALUE ALERT  Critical value received:  HgB 6.7  Date of notification:  02/13/16  Time of notification:  0410  Critical value read back:Yes.    Nurse who received alert:  Beacher May  MD notified (1st page):  Dr. Vertell Limber  Time of first page:  0411  MD notified (2nd page):  Time of second page:  Responding MD:  Dr. Murlean Iba  Time MD responded:  [phone]

## 2016-02-13 NOTE — Progress Notes (Signed)
Received from ICU via wheelchair; patient is alert and oriented; oriented to room and unit routine; wife at bedside.

## 2016-02-13 NOTE — Progress Notes (Signed)
PT Cancellation Note  Patient Details Name: Russell Gonzales MRN: [id number] DOB: 1950/06/22   Cancelled Treatment:    Reason Eval/Treat Not Completed: Other (comment). Pt just transferred up to [place] and had just ambulated with RN staff down on 81m. RN just gave pt a morphine shot and transferred him back into bed. Pt said he would ambulate with RN staff after dinner. Pt known to service and is very compliant. PT to return tomorrow to progress mobility as able.   Kingsley Callander 02/13/2016, 3:12 PM   Kittie Plater, PT, DPT Pager #: [phone] Office #: [phone]

## 2016-02-14 LAB — CBC
HCT: 22.3 % — ABNORMAL LOW (ref 39.0–52.0)
HEMATOCRIT: 20.5 % — AB (ref 39.0–52.0)
Hemoglobin: 6.7 g/dL — CL (ref 13.0–17.0)
Hemoglobin: 7.2 g/dL — ABNORMAL LOW (ref 13.0–17.0)
MCH: 31 pg (ref 26.0–34.0)
MCH: 30.6 pg (ref 26.0–34.0)
MCHC: 32.7 g/dL (ref 30.0–36.0)
MCHC: 32.3 g/dL (ref 30.0–36.0)
MCV: 94.9 fL (ref 78.0–100.0)
MCV: 94.9 fL (ref 78.0–100.0)
PLATELETS: 261 10*3/uL (ref 150–400)
PLATELETS: 225 10*3/uL (ref 150–400)
RBC: 2.16 MIL/uL — ABNORMAL LOW (ref 4.22–5.81)
RBC: 2.35 MIL/uL — ABNORMAL LOW (ref 4.22–5.81)
RDW: 14.1 % (ref 11.5–15.5)
RDW: 14.9 % (ref 11.5–15.5)
WBC: 9.6 10*3/uL (ref 4.0–10.5)
WBC: 7.1 10*3/uL (ref 4.0–10.5)

## 2016-02-14 LAB — TYPE AND SCREEN
ABO/RH(D): O POS
Antibody Screen: NEGATIVE
Unit division: 0

## 2016-02-14 NOTE — Progress Notes (Signed)
No acute events Moving legs well One more day of therapy Likely home tomorrow

## 2016-02-14 NOTE — Care Management Note (Signed)
Case Management Note  Patient Details  Name: Russell Gonzales MRN: [id number] Date of Birth: 05-Nov-1950  Subjective/Objective:  Pt underwent: T7-L2 Revision of Hardware. He is from home with his spouse.                Action/Plan: PT recommending [place] services. CM following for d/c needs.   Expected Discharge Date:                  Expected Discharge Plan:  [place]  In-House Referral:     Discharge planning Services     Post Acute Care Choice:    Choice offered to:     DME Arranged:    DME Agency:     HH Arranged:    [place] Agency:     Status of Service:  In process, will continue to follow  If discussed at Long Length of Stay Meetings, dates discussed:    Additional Comments:  Pollie Friar, RN 02/14/2016, 4:03 PM

## 2016-02-14 NOTE — Progress Notes (Signed)
Physical Therapy Treatment Patient Details Name: Russell Gonzales MRN: [id number] DOB: 01-30-1951 Today's Date: 02/14/2016    History of Present Illness 65 yo male s/p Revision of fixation from T7-L2 with repositioning of right T8 and T9 pedicle screws and revision of connection to L2 to sacrum portion of fusion    PT Comments    Patient is progressing well toward mobility goals. atient needs to practice stairs next session.   Current plan remains appropriate.   Follow Up Recommendations  Home health PT     Equipment Recommendations  None recommended by PT    Recommendations for Other Services Rehab consult     Precautions / Restrictions Precautions Precautions: Fall;Back Precaution Comments: pt able to recall 3/3 back precautions without cuing Required Braces or Orthoses: Spinal Brace Spinal Brace: Thoracolumbosacral orthotic;Applied in sitting position Restrictions Weight Bearing Restrictions: No    Mobility  Bed Mobility Overal bed mobility: Modified Independent                Transfers Overall transfer level: Needs assistance Equipment used: Rolling walker (2 wheeled) Transfers: Sit to/from Stand Sit to Stand: Supervision         General transfer comment: cues for hand placement  Ambulation/Gait Ambulation/Gait assistance: Supervision Ambulation Distance (Feet): 250 Feet Assistive device: Rolling walker (2 wheeled) Gait Pattern/deviations: Step-through pattern;Decreased stride length;Trunk flexed Gait velocity: decreased   General Gait Details: slow, steady gait; cues for posture; heavy reliance on RW   Stairs            Wheelchair Mobility    Modified Rankin (Stroke Patients Only)       Balance Overall balance assessment: Needs assistance Sitting-balance support: Feet supported;Bilateral upper extremity supported Sitting balance-Leahy Scale: Good     Standing balance support: Bilateral upper extremity supported Standing  balance-Leahy Scale: Fair                      Cognition Arousal/Alertness: Awake/alert Behavior During Therapy: WFL for tasks assessed/performed Overall Cognitive Status: Within Functional Limits for tasks assessed                      Exercises      General Comments General comments (skin integrity, edema, etc.): dressing clean/dry      Pertinent Vitals/Pain Pain Assessment: No/denies pain Pain Score: 7  Pain Location: back Pain Descriptors / Indicators: Sore Pain Intervention(s): Monitored during session;Repositioned;Patient requesting pain meds-RN notified;Limited activity within patient's tolerance    Home Living                      Prior Function            PT Goals (current goals can now be found in the care plan section) Acute Rehab PT Goals Patient Stated Goal: go home Progress towards PT goals: Progressing toward goals    Frequency  Min 5X/week    PT Plan Current plan remains appropriate    Co-evaluation             End of Session Equipment Utilized During Treatment: Back brace Activity Tolerance: Patient tolerated treatment well Patient left: in chair;with call bell/phone within reach;with family/visitor present;with nursing/sitter in room     Time: [id number] PT Time Calculation (min) (ACUTE ONLY): 22 min  Charges:  $Gait Training: 8-22 mins                    G Codes:  Salina April, PTA Pager: [phone]   02/14/2016, 3:54 PM

## 2016-02-14 NOTE — Evaluation (Signed)
Occupational Therapy Evaluation and Discharge Patient Details Name: Russell Gonzales MRN: [id number] DOB: 08-06-1950 Today's Date: 02/14/2016    History of Present Illness 65 yo male s/p Revision of fixation from T7-L2 with repositioning of right T8 and T9 pedicle screws and revision of connection to L2 to sacrum portion of fusion   Clinical Impression   PTA Pt was independent with assistive devices. Pt says pain is much better and was moving well for functional ADL at sink level and able to reach feet for LB dressing/bathing. Pt able to recall 3/3 precautions and educated on tips for maintaining during ADL (see below) Pt at level appropriate for discharge to be safe and complete self-care tasks at home with intermittent supervision from OT perspective.     Follow Up Recommendations  No OT follow up;Supervision - Intermittent    Equipment Recommendations       Recommendations for Other Services       Precautions / Restrictions Precautions Precautions: Fall;Back Precaution Booklet Issued: No Precaution Comments: pt able to recall 3/3 back precautions without cuing Required Braces or Orthoses: Spinal Brace Spinal Brace: Lumbar corset;Applied in sitting position;Other (comment) (Aspen brace) Spinal Brace Comments: Pt able to don with min A and no verbal sequencing Restrictions Weight Bearing Restrictions: No      Mobility Bed Mobility Overal bed mobility: Modified Independent                Transfers Overall transfer level: Modified independent               General transfer comment: pushes off seated surface safely    Balance Overall balance assessment: Needs assistance Sitting-balance support: No upper extremity supported;Feet supported Sitting balance-Leahy Scale: Good     Standing balance support: Bilateral upper extremity supported;Single extremity supported;During functional activity (BUE during ambulation single UE support during ADL at sink) Standing  balance-Leahy Scale: Fair                              ADL Overall ADL's : Needs assistance/impaired Eating/Feeding: Sitting;Modified independent   Grooming: Wash/dry hands;Wash/dry face;Oral care;Brushing hair;Supervision/safety;Cueing for safety (vc x2 to maintain precautions) Grooming Details (indicate cue type and reason): single UE support during grooming at sink, and able to stand at sink with no UE support for activities like putting toothpaste on toothbrush. Educated Pt to put necessary items on R side to favor dominant hand to prevent twisting         Upper Body Dressing : Minimal assistance;Sitting Upper Body Dressing Details (indicate cue type and reason): Pt able to don Aspen brace in sitting with min A for reach around back Lower Body Dressing: Min guard;Sit to/from stand Lower Body Dressing Details (indicate cue type and reason): Pt able to don/doff socks by crossing legs to bring feet up, and is supervision for sit to stand Toilet Transfer: Min guard;Comfort height toilet Toilet Transfer Details (indicate cue type and reason): min guard for safety, but Pt with safe hand placement and adhering to back precautions Toileting- Clothing Manipulation and Hygiene: Engineer, site Details (indicate cue type and reason): Reviewed tub transfer sequence with Pt, Pt states that he was doing that well at home, and has tub chair and grab bars. Pt not concerned and does not have any questions. Pt requesting to shower in the hospital.  Functional mobility during ADLs: Supervision/safety;Rolling walker (Lumbar Brace donned in sitting) General ADL Comments: Pt  able to recite 3/3 back precautions. Pt educated to use cup to spit to prevent bending during oral care. Pt compliant and pleasant.     Vision     Perception     Praxis      Pertinent Vitals/Pain Pain Assessment: 0-10 Pain Score: 4  Faces Pain Scale: Hurts a little bit Pain Location:  sore lower back Pain Descriptors / Indicators: Sore Pain Intervention(s): Monitored during session;Premedicated before session;Repositioned;Other (comment) (brace Pt stated "It gives me support and helps with pain")     Hand Dominance Right   Extremity/Trunk Assessment Upper Extremity Assessment Upper Extremity Assessment: Overall WFL for tasks assessed   Lower Extremity Assessment Lower Extremity Assessment: Overall WFL for tasks assessed       Communication Communication Communication: No difficulties   Cognition Arousal/Alertness: Awake/alert Behavior During Therapy: WFL for tasks assessed/performed Overall Cognitive Status: Within Functional Limits for tasks assessed                     General Comments       Exercises       Shoulder Instructions      Home Living Family/patient expects to be discharged to:: Private residence Living Arrangements: Spouse/significant other Available Help at Discharge: Family;Available PRN/intermittently Type of Home: Mobile home Home Access: Stairs to enter Entrance Stairs-Number of Steps: concrete slab + 3 small steps + landing + threshold Entrance Stairs-Rails: Left Home Layout: One level     Bathroom Shower/Tub: Tub/shower unit Shower/tub characteristics: Curtain Biochemist, clinical: Standard (comfort height) Bathroom Accessibility: Yes How Accessible: Accessible via walker Home Equipment: Walker - 4 wheels;Cane - single point;Grab bars - tub/shower;Shower seat;Wheelchair - manual      Lives With: Spouse    Prior Functioning/Environment Level of Independence: Independent with assistive device(s)             OT Diagnosis: Acute pain   OT Problem List: Decreased range of motion;Decreased safety awareness;Decreased knowledge of use of DME or AE;Decreased knowledge of precautions;Pain   OT Treatment/Interventions: Self-care/ADL training;DME and/or AE instruction    OT Goals(Current goals can be found in the care  plan section) Acute Rehab OT Goals Patient Stated Goal: To return home and move to [place] before winter comes OT Goal Formulation: With patient Time For Goal Achievement: 02/21/16 Potential to Achieve Goals: Good  OT Frequency:     Barriers to D/C: Decreased caregiver support (wife works M-Thurs but is available in the am and pm)  Pt functioning from OT perspective to go home with intermit. supervision       Co-evaluation              End of Session Equipment Utilized During Treatment: Rolling walker;Back brace Nurse Communication: Mobility status;Precautions  Activity Tolerance: Patient tolerated treatment well Patient left: in chair;with call bell/phone within reach   Time: [id number] OT Time Calculation (min): 40 min Charges:  OT General Charges $OT Visit: 1 Procedure OT Evaluation $OT Eval Low Complexity: 1 Procedure OT Treatments $Self Care/Home Management : 23-37 mins G-Codes:    Merri Ray Gianina Olinde 2016/03/12, 9:49 AM  Hulda Humphrey OTR/L [phone]

## 2016-02-14 NOTE — Care Management Important Message (Signed)
Important Message  Patient Details  Name: Russell Gonzales MRN: [id number] Date of Birth: [birth year]   Medicare Important Message Given:  Yes    Orbie Pyo 02/14/2016, 9:24 AM

## 2016-02-15 LAB — GLUCOSE, CAPILLARY: Glucose-Capillary: 96 mg/dL (ref 65–99)

## 2016-02-15 MED FILL — Heparin Sodium (Porcine) Inj 1000 Unit/ML: INTRAMUSCULAR | Qty: 30 | Status: AC

## 2016-02-15 MED FILL — Sodium Chloride IV Soln 0.9%: INTRAVENOUS | Qty: 1000 | Status: AC

## 2016-02-15 NOTE — Progress Notes (Signed)
Physical Therapy Treatment Patient Details Name: Russell Gonzales MRN: [id number] DOB: Jan 27, 1951 Today's Date: 02/15/2016    History of Present Illness 65 yo male s/p Revision of fixation from T7-L2 with repositioning of right T8 and T9 pedicle screws and revision of connection to L2 to sacrum portion of fusion    PT Comments    Patient tolerated gait/stair training this session. Supervision for OOB mobility. Pt with steady gait but continues to demo decreased activity tolerance and generalized weakness. Continue to progress as tolerated with anticipated d/c home with HHPT.   Follow Up Recommendations  Home health PT     Equipment Recommendations  None recommended by PT    Recommendations for Other Services Rehab consult     Precautions / Restrictions Precautions Precautions: Fall;Back Precaution Comments: pt able to recall 3/3 back precautions without cuing Required Braces or Orthoses: Spinal Brace Spinal Brace: Thoracolumbosacral orthotic;Applied in sitting position Spinal Brace Comments: pt has lumbar corset in room; continue using TLSO Restrictions Weight Bearing Restrictions: No    Mobility  Bed Mobility Overal bed mobility: Modified Independent                Transfers Overall transfer level: Needs assistance Equipment used: Rolling walker (2 wheeled) Transfers: Sit to/from Stand Sit to Stand: Supervision         General transfer comment: cues for hand placement  Ambulation/Gait Ambulation/Gait assistance: Supervision Ambulation Distance (Feet): 200 Feet Assistive device: Rolling walker (2 wheeled) Gait Pattern/deviations: Step-through pattern;Decreased stride length;Trunk flexed Gait velocity: decreased   General Gait Details: cues for posture; one seated rest break   Stairs Stairs: Yes Stairs assistance: Min guard Stair Management: One rail Left;Forwards;Sideways Number of Stairs: 4 General stair comments: educated on sequencing and  technique; descended sideways  Wheelchair Mobility    Modified Rankin (Stroke Patients Only)       Balance Overall balance assessment: Needs assistance   Sitting balance-Leahy Scale: Good       Standing balance-Leahy Scale: Fair                      Cognition Arousal/Alertness: Awake/alert Behavior During Therapy: WFL for tasks assessed/performed Overall Cognitive Status: Within Functional Limits for tasks assessed                      Exercises      General Comments        Pertinent Vitals/Pain Pain Assessment: 0-10 Pain Score: 6  Pain Location: back Pain Descriptors / Indicators: Constant;Sore Pain Intervention(s): Limited activity within patient's tolerance;Monitored during session;Repositioned;Patient requesting pain meds-RN notified;Premedicated before session    Home Living                      Prior Function            PT Goals (current goals can now be found in the care plan section) Acute Rehab PT Goals Patient Stated Goal: go home Progress towards PT goals: Progressing toward goals    Frequency  Min 5X/week    PT Plan Current plan remains appropriate    Co-evaluation             End of Session Equipment Utilized During Treatment: Back brace Activity Tolerance: Patient tolerated treatment well Patient left: in chair;with call bell/phone within reach     Time: 1026-1055 PT Time Calculation (min) (ACUTE ONLY): 29 min  Charges:  $Gait Training: 8-22 mins $Therapeutic Activity: 8-22 mins  G Codes:      Salina April, PTA Pager: [phone]   02/15/2016, 2:30 PM

## 2016-02-15 NOTE — Consult Note (Signed)
   [facility] CM Inpatient Consult   02/15/2016  Russell Gonzales 1951-05-25 [id number]  Patient screened for [facility] services.  Patient's 4th admission in 6 months in which 1 includes an inpatient rehabilitation stay.  PT is recommending HHPT

## 2016-02-15 NOTE — Progress Notes (Signed)
Patient ID: Russell Gonzales, male   DOB: [birth year], 65 y.o.   MRN: [id number] Vital signs are stable Patient's bowels have moved He is feeling much better Will plan discharge for morning

## 2016-02-16 ENCOUNTER — Encounter: Payer: PPO | Admitting: Physical Medicine & Rehabilitation

## 2016-02-16 MED ORDER — OXYCODONE-ACETAMINOPHEN 5-325 MG PO TABS: 1.0000 | ORAL_TABLET | ORAL | 0 refills | Status: AC | PRN

## 2016-02-16 MED ORDER — METHOCARBAMOL 500 MG PO TABS: 500.0000 mg | ORAL_TABLET | Freq: Four times a day (QID) | ORAL | 3 refills | Status: AC | PRN

## 2016-02-16 NOTE — Care Management Note (Signed)
Case Management Note  Patient Details  Name: JAVIEN TESCH MRN: [id number] Date of Birth: 08-Sep-1950  Subjective/Objective:                    Action/Plan: Pt discharging home with orders for [facility] services. CM met with the patient and he states he was active with [facility] prior to admission and would like to continue with them. CM called [facility] and they verified him as a patient. CM then faxed his information to the number provided with new orders for his [facility] services.   Expected Discharge Date:                  Expected Discharge Plan:  [place]  In-House Referral:     Discharge planning Services  CM Consult  Post Acute Care Choice:    Choice offered to:  Patient  DME Arranged:    DME Agency:     HH Arranged:  RN, PT [place] Agency:  [place]  Status of Service:  Completed, signed off  If discussed at [place] of Stay Meetings, dates discussed:    Additional Comments:  Pollie Friar, RN 02/16/2016, 5:12 PM

## 2016-02-16 NOTE — Progress Notes (Signed)
Physical Therapy Treatment Patient Details Name: Russell Gonzales MRN: [id number] DOB: 10-Feb-1951 Today's Date: 02/16/2016    History of Present Illness 65 yo male s/p Revision of fixation from T7-L2 with repositioning of right T8 and T9 pedicle screws and revision of connection to L2 to sacrum portion of fusion    PT Comments    Patient is making good progress with PT.  From a mobility standpoint anticipate patient will be ready for DC home with HHPT when medically ready.     Follow Up Recommendations  Home health PT     Equipment Recommendations  None recommended by PT    Recommendations for Other Services Rehab consult     Precautions / Restrictions Precautions Precautions: Fall;Back Precaution Comments: pt able to recall 3/3 back precautions without cuing Required Braces or Orthoses: Spinal Brace Spinal Brace: Thoracolumbosacral orthotic;Applied in sitting position Spinal Brace Comments: pt has lumbar corset in room; continue using TLSO Restrictions Weight Bearing Restrictions: No    Mobility  Bed Mobility Overal bed mobility: Modified Independent                Transfers Overall transfer level: Needs assistance Equipment used: Rolling walker (2 wheeled) Transfers: Sit to/from Stand Sit to Stand: Supervision         General transfer comment: safe hand placement  Ambulation/Gait Ambulation/Gait assistance: Supervision Ambulation Distance (Feet): 250 Feet Assistive device: Rolling walker (2 wheeled) Gait Pattern/deviations: Step-through pattern;Decreased stride length;Trunk flexed Gait velocity: decreased   General Gait Details: frequent cues for posture and proximity of RW; pt continues to rely heavily on RW    Stairs            Wheelchair Mobility    Modified Rankin (Stroke Patients Only)       Balance     Sitting balance-Leahy Scale: Good       Standing balance-Leahy Scale: Fair                      Cognition  Arousal/Alertness: Awake/alert Behavior During Therapy: WFL for tasks assessed/performed Overall Cognitive Status: Within Functional Limits for tasks assessed                      Exercises      General Comments General comments (skin integrity, edema, etc.): dressing clean/dry; discussed self monitoring posture/fatigue and need for rest breaks when at home      Pertinent Vitals/Pain Pain Assessment: Faces Faces Pain Scale: Hurts a little bit Pain Location: back and trunk Pain Descriptors / Indicators: Sore Pain Intervention(s): Monitored during session;Premedicated before session;Repositioned;Limited activity within patient's tolerance    Home Living                      Prior Function            PT Goals (current goals can now be found in the care plan section) Acute Rehab PT Goals Patient Stated Goal: go home Progress towards PT goals: Progressing toward goals    Frequency  Min 5X/week    PT Plan Current plan remains appropriate    Co-evaluation             End of Session Equipment Utilized During Treatment: Back brace Activity Tolerance: Patient tolerated treatment well;No increased pain Patient left: in chair;with call bell/phone within reach     Time: 1528-1558 PT Time Calculation (min) (ACUTE ONLY): 30 min  Charges:  $Gait Training: 8-22 mins $Therapeutic Activity:  8-22 mins                    G Codes:      Salina April, PTA Pager: [phone]   02/16/2016, 4:07 PM

## 2016-02-16 NOTE — Progress Notes (Signed)
Pt discharging at this time with his wife taking all personal belongings. Pt assisted with a shower this afternoon surgical dressing removed cleansed applied betadine and dry dressing. IV discontinued, dry dressing applied. Discharge instructions provided with prescription with verbal understanding. No noted distress.

## 2016-02-16 NOTE — Discharge Summary (Signed)
Physician Discharge Summary  Patient ID: Russell Gonzales MRN: [id number] DOB/AGE: 02/09/1951 65 y.o.  Admit date: 02/11/2016 Discharge date: 02/16/2016  Admission Diagnoses:Loss of fixation of S1 fusion with severe back pain. Thoracic myelopathy  Discharge Diagnoses: Loss of fixation of T7 S1 fusion with severe back pain. Thoracic myelopathy Active Problems:   Thoracic disc disease with myelopathy   Spinal surgery in prior 3 months   Discharged Condition: fair  Hospital Course: Patient was admitted because he had the severe onset of midthoracic back pain. He is 3 weeks status post decompression and fusion with fixation from T7 to the region of L2-3 is a previous fusion from L2 to sacrum. He lost fixation at the junction Burr with the L2 hardware. He was returned to the operating room where screws were repositioned and the hardware was reapproximated.  Consults: pulmonary/intensive care  Significant Diagnostic Studies: None  Treatments: surgery: Revision of fixation with replacement of T8 and T9 screws on the right side and reapproximation of rods at the level of L2  Discharge Exam: Blood pressure (!) 140/97, pulse 95, temperature 98 F (36.7 C), temperature source Oral, resp. rate 18, height 5\' 6"  (1.676 m), weight 113.2 kg (249 lb 8 oz), SpO2 96 %. Incision is clean and dry. Motor function is intact in lower extremities. Patient has myelopathic wide-based gait that has been his preoperative finding  Disposition: 06-Home-Health Care Svc  Discharge Instructions    Call MD for:  redness, tenderness, or signs of infection (pain, swelling, redness, odor or green/yellow discharge around incision site)    Complete by:  As directed   Call MD for:  severe uncontrolled pain    Complete by:  As directed   Call MD for:  temperature >100.4    Complete by:  As directed   Diet - low sodium heart healthy    Complete by:  As directed   Increase activity slowly    Complete by:  As directed        Medication List    TAKE these medications   atorvastatin 20 MG tablet Commonly known as:  LIPITOR Take 1 tablet (20 mg total) by mouth daily.   famotidine 20 MG tablet Commonly known as:  PEPCID Take 1 tablet (20 mg total) by mouth 2 (two) times daily.   fentaNYL 25 MCG/HR patch Commonly known as:  DURAGESIC - dosed mcg/hr Place 1 patch (25 mcg total) onto the skin every 3 (three) days.   fish oil-omega-3 fatty acids 1000 MG capsule Take 1 g by mouth 2 (two) times daily.   gabapentin 600 MG tablet Commonly known as:  NEURONTIN Take 1 tablet (600 mg total) by mouth 3 (three) times daily.   HYDROcodone-acetaminophen 5-325 MG tablet Commonly known as:  NORCO/VICODIN Take 1 tablet by mouth every 6 (six) hours as needed for moderate pain (breakthrough pain).   methocarbamol 750 MG tablet Commonly known as:  ROBAXIN Take 1 tablet (750 mg total) by mouth 4 (four) times daily as needed for muscle spasms. What changed:  Another medication with the same name was added. Make sure you understand how and when to take each.   methocarbamol 500 MG tablet Commonly known as:  ROBAXIN Take 1 tablet (500 mg total) by mouth every 6 (six) hours as needed for muscle spasms. What changed:  You were already taking a medication with the same name, and this prescription was added. Make sure you understand how and when to take each.   MULTIVITAMIN ADULT PO  Take 1 tablet by mouth daily.   oxyCODONE-acetaminophen 5-325 MG tablet Commonly known as:  PERCOCET/ROXICET Take 1-2 tablets by mouth every 4 (four) hours as needed for moderate pain.   polyethylene glycol packet Commonly known as:  MIRALAX / GLYCOLAX Take 17 g by mouth 2 (two) times daily.   traMADol 50 MG tablet Commonly known as:  ULTRAM Take 1 tablet (50 mg total) by mouth every 6 (six) hours as needed for moderate pain.        SignedEarleen Newport 02/16/2016, 4:22 PM

## 2016-02-17 ENCOUNTER — Other Ambulatory Visit: Payer: Self-pay | Admitting: *Deleted

## 2016-02-17 NOTE — Patient Outreach (Signed)
[facility]) Care Management  02/17/2016  Russell Gonzales [birth year] [id number]   RN verified pt with two identifiers and introduced the [facility] program. Pt very receptive and was interested in more information about the [facility] services and available program. RN discussed program and benefits of enrollment into the program for services. RN inquired further on pt's recent discharge and requested to review his discharge orders along with his medications. Pt did not have this information available and request a call back on tomorrow for his spouse for additional details. RN will follow up tomorrow and completed the transition of care template. Pt has indicated [place] is involved for PT and nursing services with the initial home visit completed today. Pt indicated he did not want to be overwhelmed with services and feels at this time ongoing telephone call would be sufficient (transition of care). RN offered to follow up weekly over the next several weeks as pt agreed however again request RN to consult with his spouse for details on his appointments for scheduled such telephone appointments. Will follow up tomorrow for details.  Raina Mina, RN Care Management Coordinator [place] Office [phone]

## 2016-02-18 ENCOUNTER — Other Ambulatory Visit: Payer: Self-pay | Admitting: *Deleted

## 2016-02-18 ENCOUNTER — Encounter: Payer: Self-pay | Admitting: *Deleted

## 2016-02-18 ENCOUNTER — Inpatient Hospital Stay: Payer: PPO | Admitting: Physical Medicine & Rehabilitation

## 2016-02-18 NOTE — Patient Outreach (Addendum)
[facility]) Care Management  02/18/2016  Russell Gonzales Jan 27, 1951 [id number]  RN reintroduced the [facility] program and again the purpose for today's call. Pt has granted permission for RN to obtain his information from his spouse who is the primary caregiver. Wife reports pt is recovering well. RN able to complete the initial assessment today and verify pt has [facility] and will be receiving PT services starting Monday. Caregiver reported [facility] will be visiting for therapy twice weekly. Pt has an upcoming appointment with the neurosurgeon Dr. Ellene Route however no appointment has been arranged with the primary doctor Dr. Everlene Farrier who will be leaving the practice soon. RN strongly encouraged caregiver to follow up with Dr. [facility]o [place] office to inquire on another provider within that practice if preferred or to seek immediately another provider for his ongoing care. Also discussed the need for any upcoming refills on his medications to be requested if there is a lapse in providers during the time (caregiver verbalized an understanding). RN offered community home visits however declined but receptive to ongoing transition of care contacts over the next few weeks. Will schedule a follow up call for next Friday (only day off for caregiver to update). Based upon the information provided today will discussed a plan of care with goals and verify pt's understanding for ongoing evaluation on his progress.   Caregiver inquired on operative site for possible dressing changes indicates no instruction were provided to change the dressing however supplies were rendered. RN strongly encouraged pt to contact the surgeon Dr. Ellene Route and inquire further on wound care however not to remove the surgical dressing unless otherwise informed by the provider (caregiver with clear understanding). Also encouraged pt to make Dr. Ellene Route aware that [facility] is currently involved if wound care is needed with specific orders. No other  inquires or request at this time.   Patient was recently discharged from hospital and all medications have been reviewed.  Raina Mina, RN Care Management Coordinator [place] Office [phone]

## 2016-02-18 NOTE — Patient Outreach (Signed)
[facility]) Care Management  02/18/2016  Russell Gonzales 08-Jun-1951 [id number]   RN spoke with pt who granted permission to speak with his wife concerning his health care information. RN spoke with pt's primary caregiver spouse who indicated they were out for lunch and requested RN to once again follow up later this afternoon. The call back time agreed upon will be between 4:30-5:00pm today. Will follow up at that time and inquire further on pt's management of care.  Raina Mina, RN Care Management Coordinator [place] Office [phone]

## 2016-02-19 NOTE — Progress Notes (Signed)
I will be retiring in 2 weeks

## 2016-02-25 ENCOUNTER — Other Ambulatory Visit: Payer: Self-pay | Admitting: *Deleted

## 2016-02-25 NOTE — Patient Outreach (Signed)
[place]) Care Management  02/25/2016  Russell Gonzales Aug 04, 1950 [id number]  RN spoke with pt today for ongoing transition of care contact. Pt reports he is doing well and recently followed up with Dr. Ellene Route (neurologist) with a good report. No reported issues as pt continues to recovery will with no encountered issues. RN continues to offer home visits if needed and ongoing community resources however pt only receptive to ongoing transition of care calls of inquires. RN reminded pt that he needed to establish another provider soon due to his current provider will be leaving soon. Pt aware and will contact Dr. Caren Griffins office and attempt to seek another provider within the same practice if available. RN also encouraged pt to request any needed refills on his medication to avoid not having enough supplies awaiting any arranged appointments ( pt with understanding). RN offered to arrange the next follow up appointment for next week for ongoing transition of care and offered any needed resources today. No needed resources as pt continues to receive support from his spouse who is the primary caregiver. No other inquires or request at this time.Will follow up accordingly.  Raina Mina, RN Care Management Coordinator [place] Office [phone]

## 2016-03-01 ENCOUNTER — Other Ambulatory Visit: Payer: Self-pay | Admitting: *Deleted

## 2016-03-01 ENCOUNTER — Ambulatory Visit (INDEPENDENT_AMBULATORY_CARE_PROVIDER_SITE_OTHER): Payer: PPO | Admitting: Emergency Medicine

## 2016-03-01 VITALS — BP 138/98 | HR 85 | Temp 98.1°F | Resp 16 | Ht 66.0 in | Wt 238.0 lb

## 2016-03-01 DIAGNOSIS — Z23 Encounter for immunization: Secondary | ICD-10-CM | POA: Diagnosis not present

## 2016-03-01 DIAGNOSIS — R739 Hyperglycemia, unspecified: Secondary | ICD-10-CM

## 2016-03-01 DIAGNOSIS — I10 Essential (primary) hypertension: Secondary | ICD-10-CM

## 2016-03-01 LAB — BASIC METABOLIC PANEL WITH GFR
BUN: 12 mg/dL (ref 7–25)
CALCIUM: 9.3 mg/dL (ref 8.6–10.3)
CO2: 26 mmol/L (ref 20–31)
CREATININE: 0.82 mg/dL (ref 0.70–1.25)
Chloride: 106 mmol/L (ref 98–110)
GLUCOSE: 74 mg/dL (ref 65–99)
Potassium: 4.7 mmol/L (ref 3.5–5.3)
SODIUM: 140 mmol/L (ref 135–146)

## 2016-03-01 LAB — GLUCOSE, POCT (MANUAL RESULT ENTRY): POC Glucose: 81 mg/dl (ref 70–99)

## 2016-03-01 LAB — POCT CBC
GRANULOCYTE PERCENT: 56.6 % (ref 37–80)
HEMATOCRIT: 30.1 % — AB (ref 43.5–53.7)
Hemoglobin: 10.4 g/dL — AB (ref 14.1–18.1)
Lymph, poc: 2 (ref 0.6–3.4)
MCH: 31 pg (ref 27–31.2)
MCHC: 34.5 g/dL (ref 31.8–35.4)
MCV: 90 fL (ref 80–97)
MID (CBC): 0.5 (ref 0–0.9)
MPV: 5.1 fL (ref 0–99.8)
PLATELET COUNT, POC: 379 10*3/uL (ref 142–424)
POC GRANULOCYTE: 3.2 (ref 2–6.9)
POC LYMPH PERCENT: 35.4 %L (ref 10–50)
POC MID %: 8 %M (ref 0–12)
RBC: 3.35 M/uL — AB (ref 4.69–6.13)
RDW, POC: 15.7 %
WBC: 5.7 10*3/uL (ref 4.6–10.2)

## 2016-03-01 MED ORDER — LISINOPRIL 10 MG PO TABS: ORAL_TABLET | ORAL | 3 refills | Status: AC

## 2016-03-01 NOTE — Patient Instructions (Addendum)
Please monitor your blood pressure at home. Take your lisinopril 1-2 tablets daily to keep blood pressure less than 140/90.    IF you received an x-ray today, you will receive an invoice from [facility]. Please contact [facility] at [phone] with questions or concerns regarding your invoice.   IF you received labwork today, you will receive an invoice from [employer]. Please contact [facility] at [phone] with questions or concerns regarding your invoice.   Our billing staff will not be able to assist you with questions regarding bills from these companies.  You will be contacted with the lab results as soon as they are available. The fastest way to get your results is to activate your My Chart account. Instructions are located on the last page of this paperwork. If you have not heard from [name] regarding the results in 2 weeks, please contact this office.

## 2016-03-01 NOTE — Progress Notes (Addendum)
By signing my name below, I, Judithe Modest, attest that this documentation has been prepared under the direction and in the presence of Nena Jordan, MD. Electronically Signed: Judithe Modest, ER Scribe. 03/01/2016. 12:22 PM.  Chief Complaint:  Chief Complaint  Patient presents with  . Follow-up    hospital 2 weeks, back pain     HPI: Russell Gonzales is a 65 y.o. male who reports to [facility] today reporting for a hospital follow up. He is still taking lipitor. He is not taking a blood pressure medication. He does not have a blood pressure cuff at home. He states he has tried to come off his pain medication, but was too sore to get out of bed without the medication. He was previously on HCTZ [id number] and licinopril 40.    Past Medical History:  Diagnosis Date  . Arthritis    "severe in my back" (08/11/2015)  . Chronic lower back pain   . Fatty liver   . GERD (gastroesophageal reflux disease)   . Hemorrhoids   . Hyperlipidemia   . Hypertension   . Low iron   . Prediabetes   . Seasonal allergies   . Shortness of breath dyspnea    With exertion   Past Surgical History:  Procedure Laterality Date  . ANKLE FRACTURE SURGERY Left 1972   "it was crushed; has screws and plates"  . ANTERIOR CERVICAL DECOMP/DISCECTOMY FUSION  05/2008  . BACK SURGERY    . COLONOSCOPY W/ BIOPSIES AND POLYPECTOMY    . FRACTURE SURGERY    . KNEE ARTHROSCOPY Left 1970s   "removed cyst"  . LUMBAR LAMINECTOMY/DECOMPRESSION MICRODISCECTOMY N/A 08/11/2015   Procedure: Laminectomy - Thoracic eleven - Thoracic tweleve;  Surgeon: Eustace Moore, MD;  Location: [facility];  Service: Neurosurgery;  Laterality: N/A;  . POLYPECTOMY  2016  . POSTERIOR LUMBAR FUSION  04/2008   "L1-S1; have rods and screws between 4 vertebrae"  . POSTERIOR LUMBAR FUSION 4 LEVEL N/A 01/18/2016   Procedure: Thoracic Eight-Lumbar Two Laminectomy with posterior decompression/segmental fixation;  Surgeon: [name], MD;  Location: [place] [facility];  Service: Neurosurgery;  Laterality: N/A;  T8 to L2 Laminectomy with posterior decompression/segmental fixation from T8 to L2  . PROSTATE BIOPSY  2012  . THORACIC LAMINECTOMY  08/11/2015   Decompressive thoracic laminectomy, medial facetectomy and foraminotomy T11 and T12   Social History   Social History  . Marital status: Married    Spouse name: N/A  . Number of children: N/A  . Years of education: N/A   Social History Main Topics  . Smoking status: Former Smoker    Packs/day: 1.00    Years: 15.00    Types: Cigars  . Smokeless tobacco: Never Used     Comment: "quit smoking in the late 1980s"  . Alcohol use 21.0 oz/week    14 Shots of liquor, 21 Standard drinks or equivalent per week     Comment: 08/11/2015 whisky 2 drinks a day  . Drug use:     Types: Other-see comments     Comment: "smioked pot in the 1960's and 1970's; plus a few other things back then"  . Sexual activity: Not Currently   Other Topics Concern  . None   Social History Narrative   Lives with wife and 3 dogs in a one story home.  Has 5 children.  Retired from Architect work.  Education: high school.   Family History  Problem Relation Age of Onset  .  Pancreatic cancer Mother   . Prostate cancer Father   . Colon cancer Neg Hx    Allergies  Allergen Reactions  . Aspirin Hives  . Bc Fast Pain [Aspirin-Caffeine] Hives  . Ancef [Cefazolin] Rash    RASH  . Tape Rash    Surgical Tape- 2009ish  Back surgery - broke out- rash   Prior to Admission medications   Medication Sig Start Date End Date Taking? Authorizing Provider  atorvastatin (LIPITOR) 20 MG tablet Take 1 tablet (20 mg total) by mouth daily. 02/03/16  Yes Daniel J Angiulli, PA-C  famotidine (PEPCID) 20 MG tablet Take 1 tablet (20 mg total) by mouth 2 (two) times daily. 02/03/16  Yes Daniel J Angiulli, PA-C  fentaNYL (DURAGESIC - DOSED MCG/HR) 25 MCG/HR patch Place 1 patch (25 mcg total) onto the skin every 3 (three) days. 02/03/16  Yes Daniel J  Angiulli, PA-C  fish oil-omega-3 fatty acids 1000 MG capsule Take 1 g by mouth 2 (two) times daily.     Yes Historical Provider, MD  gabapentin (NEURONTIN) 600 MG tablet Take 1 tablet (600 mg total) by mouth 3 (three) times daily. 02/03/16  Yes Daniel J Angiulli, PA-C  HYDROcodone-acetaminophen (NORCO/VICODIN) 5-325 MG tablet Take 1 tablet by mouth every 6 (six) hours as needed for moderate pain (breakthrough pain).   Yes Historical Provider, MD  methocarbamol (ROBAXIN) 500 MG tablet Take 1 tablet (500 mg total) by mouth every 6 (six) hours as needed for muscle spasms. 02/16/16  Yes [name], MD  Multiple Vitamins-Minerals (MULTIVITAMIN ADULT PO) Take 1 tablet by mouth daily.    Yes Historical Provider, MD  oxyCODONE-acetaminophen (PERCOCET/ROXICET) 5-325 MG tablet Take 1-2 tablets by mouth every 4 (four) hours as needed for moderate pain. 02/16/16  Yes [name], MD  polyethylene glycol (MIRALAX / GLYCOLAX) packet Take 17 g by mouth 2 (two) times daily. 02/03/16  Yes Daniel J Angiulli, PA-C     ROS: The patient denies fevers, chills, night sweats, unintentional weight loss, chest pain, palpitations, wheezing, dyspnea on exertion, nausea, vomiting, abdominal pain, dysuria, hematuria, melena, numbness, weakness, or tingling.   All other systems have been reviewed and were otherwise negative with the exception of those mentioned in the HPI and as above.    PHYSICAL EXAM: Vitals:   03/01/16 1158  BP: (!) 138/98  Pulse: 85  Resp: 16  Temp: 98.1 F (36.7 C)   Body mass index is 38.41 kg/m.   General: Alert, no acute distress HEENT:  Normocephalic, atraumatic, oropharynx patent. Eye: Juliette Mangle [facility] Cardiovascular:  Regular rate and rhythm, no rubs murmurs or gallops.  No Carotid bruits, radial pulse intact. No pedal edema.  Respiratory: Clear to auscultation bilaterally.  No wheezes, rales, or rhonchi.  No cyanosis, no use of accessory musculature Abdominal: No organomegaly, abdomen is  soft and non-tender, positive bowel sounds.  No masses. Musculoskeletal: Patient is able to walk with the assistance of a walker. He has a total body back brace on. Skin: No rashes. Neurologic: Facial musculature symmetric. Psychiatric: Patient acts appropriately throughout our interaction. Lymphatic: No cervical or submandibular lymphadenopathy    LABS: Results for orders placed or performed in visit on 03/01/16  POCT CBC  Result Value Ref Range   WBC 5.7 4.6 - 10.2 K/uL   Lymph, poc 2.0 0.6 - 3.4   POC LYMPH PERCENT 35.4 10 - 50 %L   MID (cbc) 0.5 0 - 0.9   POC MID % 8.0 0 - 12 %M  POC Granulocyte 3.2 2 - 6.9   Granulocyte percent 56.6 37 - 80 %G   RBC 3.35 (A) 4.69 - 6.13 M/uL   Hemoglobin 10.4 (A) 14.1 - 18.1 g/dL   HCT, POC 30.1 (A) 43.5 - 53.7 %   MCV 90.0 80 - 97 fL   MCH, POC 31.0 27 - 31.2 pg   MCHC 34.5 31.8 - 35.4 g/dL   RDW, POC 15.7 %   Platelet Count, POC 379 142 - 424 K/uL   MPV 5.1 0 - 99.8 fL  POCT glucose (manual entry)  Result Value Ref Range   POC Glucose 81 70 - 99 mg/dl     EKG/XRAY:   Primary read interpreted by Dr. [facility]lene Farrier at [facility].   ASSESSMENT/PLAN: Will start lisinopril 10 mg. He can take 1-2 tablets a day to keep his blood pressure less than 140/90.I personally performed the services described in this documentation, which was scribed in my presence. The recorded information has been reviewed and is accurate. Sugar was slightly elevated with diminished renal function in the hospital. Will check a basic metabolic panel and have him monitor his pressure at home. He will be moving back to [place]. He was given a flu shot today.. Hemoglobin is still slightly low following surgery.   Gross sideeffects, risk and benefits, and alternatives of medications d/w patient. Patient is aware that all medications have potential sideeffects and we are unable to predict every sideeffect or drug-drug interaction that may occur.  [name] MD 03/01/2016 12:22  PM

## 2016-03-01 NOTE — Patient Outreach (Signed)
[facility]) Care Management  03/01/2016  Russell Gonzales [birth year] [id number]   RN spoke with pt today who updated RN on his recent appointment with Dr. Everlene Farrier with lab work and physical. Good report with no issues to address at this time as pt reports he will continue to take his BP medications with no other changes.  Pt continues to take all medications as prescribed and attend all scheduled appointments as RN reviewed pt's plan of care and current goals listed.  Pt aware RN remains available for home visits however pt is managing his care well with no reported issues or problems. RN offered to continue ongoing transition of care contacts as pt remains receptive. RN will scheduled the next transition of care call next week and follow up accordingly.  Raina Mina, RN Care Management Coordinator [place] Office [phone]

## 2016-03-10 ENCOUNTER — Other Ambulatory Visit: Payer: Self-pay | Admitting: *Deleted

## 2016-03-10 NOTE — Patient Outreach (Signed)
[facility]) Care Management  03/10/2016  DIONE DONAIS [birth year] [id number]   RN spoke with pt today and inquired on his ongoing management of care. Pt reports good report from his recent doctor's visit on Tuesday. Pt continues to report [facility] who visited this week for wound care. Pt informed will remove dressing soon. Pt states he is weaning himself off of the pain medication with good tolerance. Pt also mention not taking his Gabapentin. RN strongly encouraged pt to take all prescribed medication with no alteration in the dosage unless inform otherwise by his provider. Also encourage pt to report any changes in his that he wishes to change with his medication by contacting his provider prior to make any changes in his medications (pt with understanding). All medications review today for adherence. Pt remains aware that [facility] can offer community home visits if needed to address issues more on face to face however pt continues to be receptive to telephone follow ups. Will schedule another call next week for ongoing transition of care.   Raina Mina, RN Care Management Coordinator [place] Office [phone]

## 2016-03-15 ENCOUNTER — Other Ambulatory Visit: Payer: Self-pay | Admitting: *Deleted

## 2016-03-15 NOTE — Patient Outreach (Signed)
[facility]) Care Management  03/15/2016  JOHNAVAN DOEDEN 1951-04-10 [id number]   RN spoke with pt today who indicates he continues to get "alittle better". Reports last visit to Dr. Everlene Farrier provided 90 days of medications and supplies until he is assigned a new provider at the same office (Dr. Everlene Farrier is leaving the practice). Pt continues to verify his has a good support system to remind him on his ongoing medical issues but appreciative for the ongoing follow up calls this RN is providing. RN offered ongoing community case management if he felt he needed one-on-one home visits but remains receptive to ongoing transition of care calls only. RN continued to encouraged adherence in managing his care and will schedule another home visit for next week as the last call via [facility] services. Will assess at that time if pt needs an ongoing health coach to intervene. No other request or inquires at this time.   Raina Mina, RN Care Management Coordinator [place] Office [phone]

## 2016-03-20 ENCOUNTER — Encounter: Payer: Self-pay | Admitting: *Deleted

## 2016-03-20 ENCOUNTER — Other Ambulatory Visit: Payer: Self-pay | Admitting: *Deleted

## 2016-03-20 NOTE — Patient Outreach (Signed)
[facility]) Care Management  03/20/2016  Russell Gonzales [birth year] [id number]   RN spoke with pt today who reports he is doing "fine" with no reported issues or complaints.. States he continues to travel with his companion  And attends all medical appointments with no delays  (pending office visit with neurosurgeon).  Pt verified he is taking all medications as prescribed with a sufficient amount. Denies any issues at this time as pt informed this is the last telephone assessment related to this episode of event. Pt is aware that RN will notify the current provider concerning his discharge via [facility] based upon his successful recovery. No other inqiries or request at this time as plan of care reviewed with all goals met with no delays. Pt aware on how to contact the [facility] office if needed in the future. Case will be closed.  Raina Mina, RN Care Management Coordinator [place] Office [phone]

## 2017-09-17 IMAGING — CT CT T SPINE W/ CM
3 series · 11 of 33 positions shown, 13 images · non-contrast
Comparison: none

CLINICAL DATA: Rapid worsening of lower extremity weakness
concerning for thoracic myelopathy.
TECHNIQUE: Contiguous axial images were obtained through the Thoracic and
Lumbar spine after the intrathecal infusion of infusion. Coronal and
sagittal reconstructions were obtained of the axial image sets.

[Series 4: l-spine 2.0 st · axial · 0.34mm/px · z∈[-878,-706]mm · 3 of 141 slices shown, 4 images]
[im 33/141  soft-tissue]
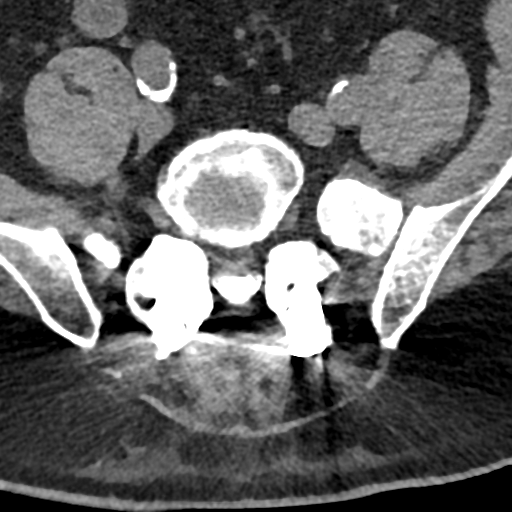
[im 33/141  bone]
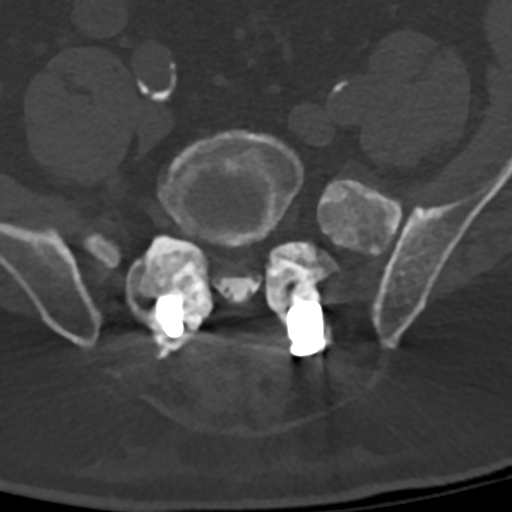
[im 76/141  bone]
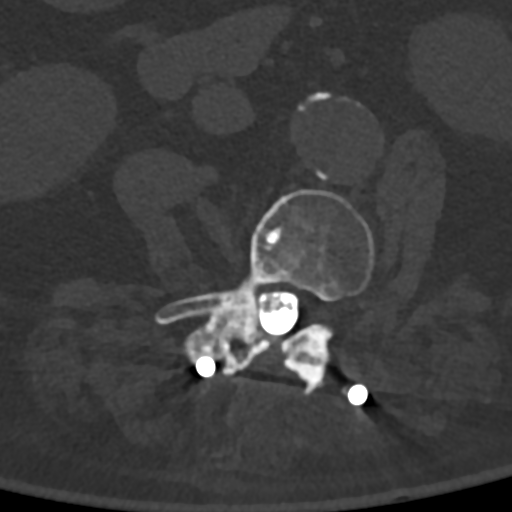
[im 119/141  bone]
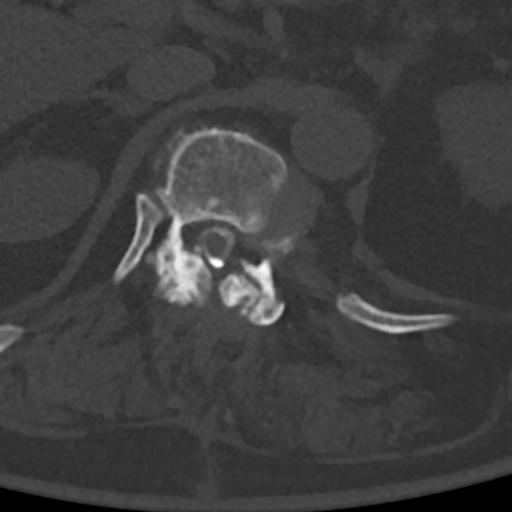

[Series 8: l-spine 2.0 cor bone · coronal · 0.41mm/px · 3 of 68 slices shown]
[im 14/68  bone]
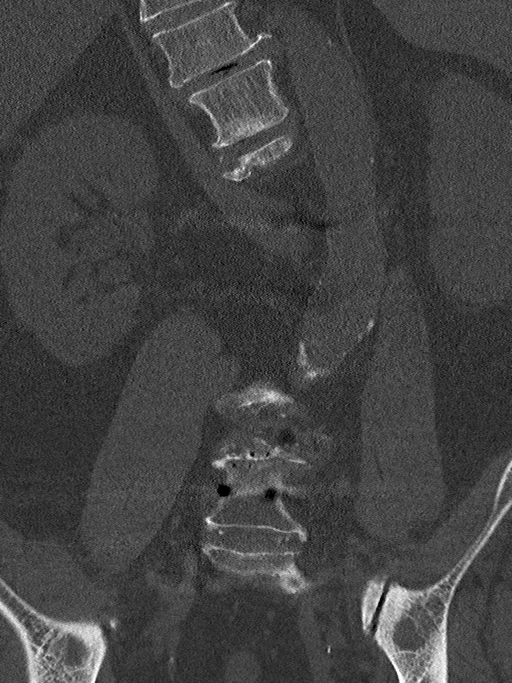
[im 27/68  bone]
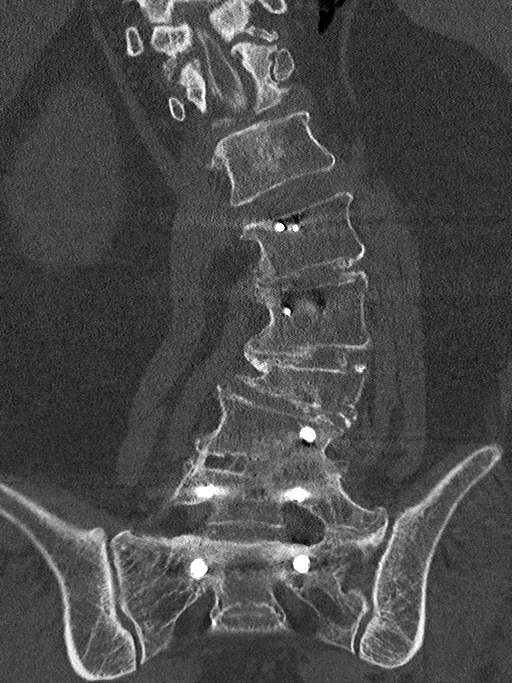
[im 41/68  bone]
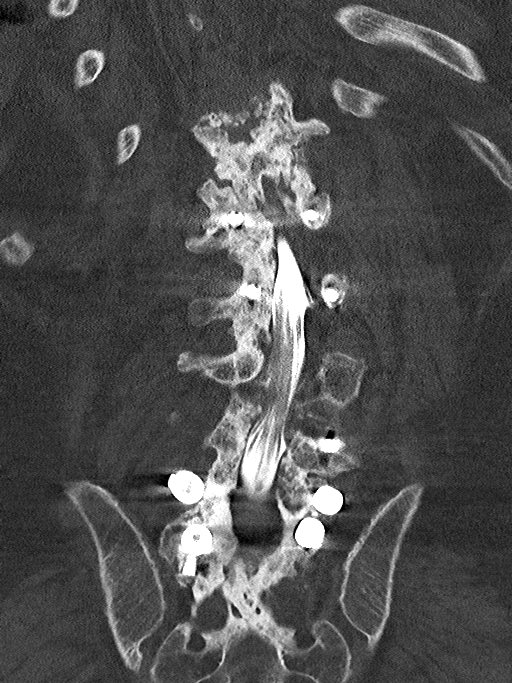

[Series 9: l-spine 2.0 sag bone · sagittal · 0.41mm/px · 5 of 100 slices shown, 6 images]
[im 34/100  bone]
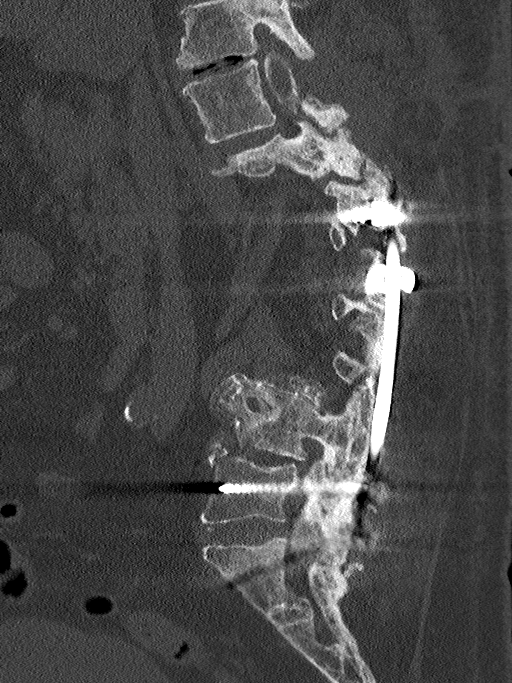
[im 42/100  bone]
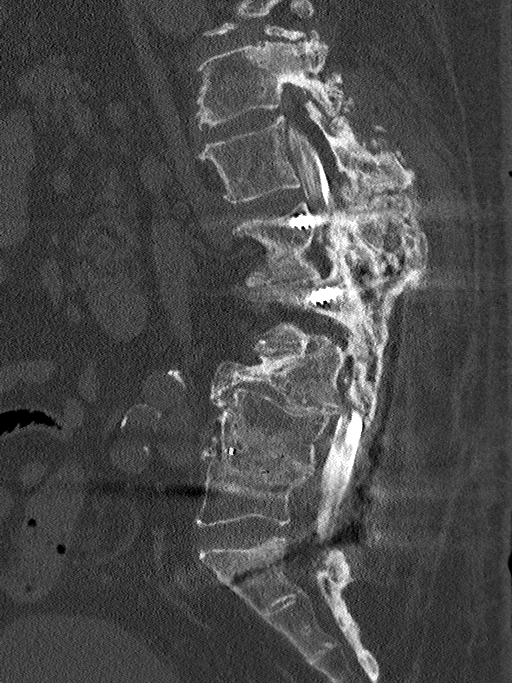
[im 50/100  soft-tissue]
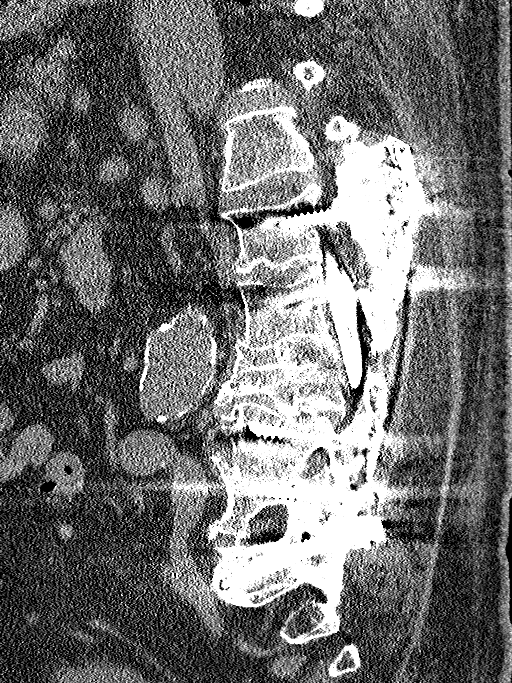
[im 50/100  bone]
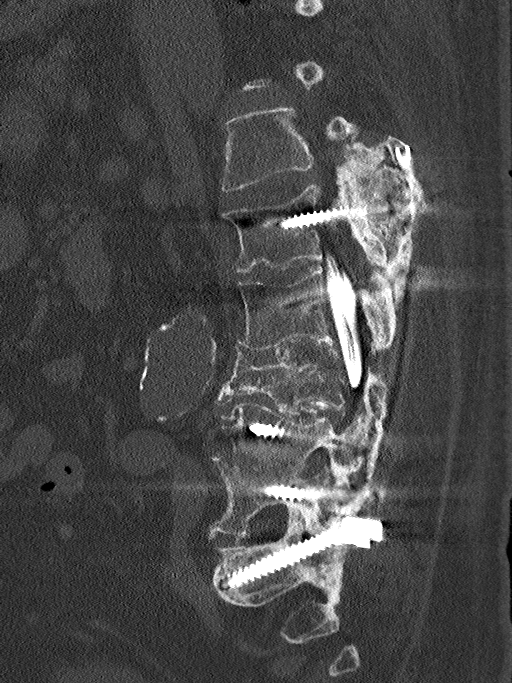
[im 58/100  bone]
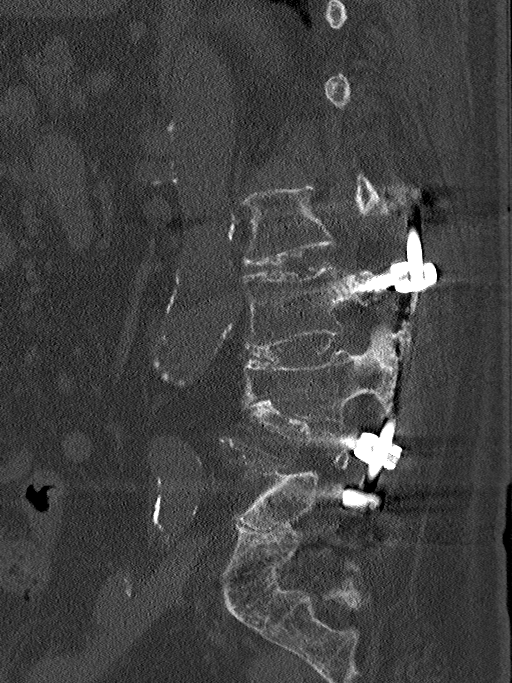
[im 67/100  bone]
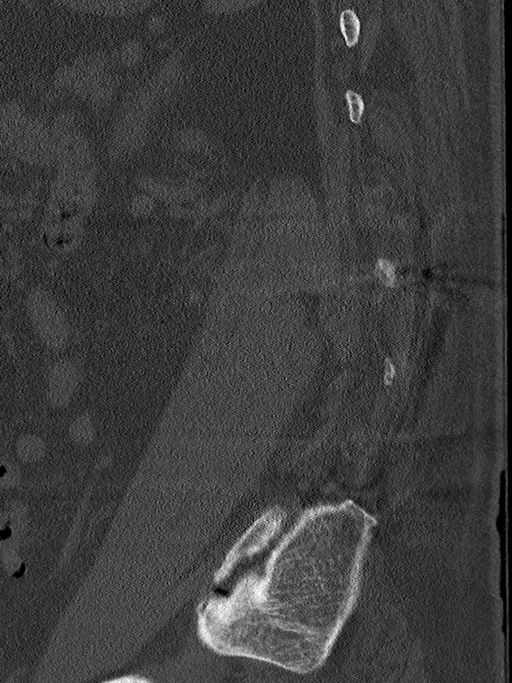

[11 of 33 positions shown; findings below may reference images not displayed]

FLUOROSCOPY TIME:  1 minutes 24 seconds.

83.5 mGy air Kerma

PROCEDURE:
LUMBAR PUNCTURE FOR THORACIC AND LUMBAR MYELOGRAM

After thorough discussion of risks and benefits of the procedure
including bleeding, infection, seizure, injury to nerves, blood
vessels, adjacent structures as well as headache and CSF leak,
written and oral informed consent was obtained. Consent was obtained
by Dr. Eulogia Campuzano.

Patient was positioned prone on the fluoroscopy table. Local
anesthesia was provided with 1% lidocaine without epinephrine after
prepped and draped in the usual sterile fashion. Puncture was
performed at lower lumbar laminectomy defect using a 3 1/2 inch
22-gauge spinal needle. Using a single pass through the dura, the
needle was placed within the thecal sac, with return of clear CSF.
10 mL of Cmnipaque-1FF was injected into the thecal sac, with normal
opacification of the nerve roots and cauda equina consistent with
free flow within the subarachnoid space. The patient was then moved
to the trendelenburg position and contrast flowed into the thoracic
spine region.

I personally performed the lumbar puncture and administered the
intrathecal contrast. I also personally supervised acquisition of
the myelogram images.
FINDINGS: THORACIC AND LUMBAR MYELOGRAM FINDINGS:

No block was encountered. There is no evidence of acute hardware
failure or osseous fracture. Patient could not tolerate loading due
to weakness. Adequate flexion and extension views were not possible
when recumbent.

CT THORACIC MYELOGRAM FINDINGS:

Twelve paired ribs.  Normal segmentation.

Thoracic dextroscoliosis. Cobb angle not measured in this supine
patient.

Normal cord bulk and morphology.

No evidence of acute fracture, endplate erosion, or focal bone
lesion.

Affected disc levels:

T8-9 advanced facet arthropathy with spurring. Foraminal narrowing
but patent superior foramina.

T9-10: Severe facet arthropathy on the left with bulky spurring.
Spurs cause advanced left foraminal stenosis with T9 impingement.
Patent canal.

T10-11: Severe facet arthropathy on the left. Spurs and soft tissue
completely effaces the left foramen. Patent canal.

T11-12: Severe facet hypertrophy with bulky spurring and sclerosis
on the left. Disc narrowing and vacuum phenomenon. Leftward disc
bulging. Severe bilateral foraminal stenosis. The left foramen is
completely effaced. There is moderate canal stenosis with
circumferential residual CSF but transverse cord flattening related
to posterior element hypertrophy. Appearance is stable compared to
6187.

T12-L1:  Described below.

CT LUMBAR MYELOGRAM FINDINGS:

Transitional anatomy as described on myelography 05/27/2015.
Pseudoarthrosis between the S1 left transverse process and sacrum,
with posterior fusion at this level

L2-S2 posterior fixation using rod and pedicle screws. Screws spare
the L4 level and L5 right pedicle. There is chronic fracturing of
the right S2 screw. No acute hardware fracture. Bony fusion is solid
throughout the posterior lateral spine. Solid intervertebral fusion
at the L5-S1 discectomy space. Intervertebral ankylosis has also
occurred from L2-L5. Remote L4 compression fracture. No acute
osseous finding.

Lumbar levoscoliosis.

Predominant subarachnoid injection, but there is a dorsal
high-density with mild mass effect consistent with focal subdural
infiltration. When accounting for this there is no suspicious
clumping arachnoiditis. No evidence of conus swelling.

Disc levels:

T12- L1: Severe facet hypertrophy with sclerosis and bulky spurring.
Disc narrowing and bulging with left foraminal to far lateral disc
protrusion/asymmetric bulge. Mild canal stenosis without cord
compression. Complete effacement of left foraminal fat by disc
bulge. Right foraminal stenosis is moderate to advanced.

L1-L2: Arthrodesis or ankylosis posteriorly. Bulky facet
hypertrophy. Disc narrowing and bulging. Height loss is greater on
the right. Patent canal. Patent foramina.

L2-L3: Postsurgical fusion and intervertebral ankylosis. Patent
canal and foramina.

L3-L4: Postsurgical fusion and intervertebral ankylosis. Patent
canal and foramina.

L4-L5: Postsurgical fusion.  Patent canal and foramina.

L5-S1:Postsurgical fusion. Residual foraminal narrowing from disc
height loss and ridging. Both foramina are open. Negative for canal
stenosis.

S1-2:  Postsurgical fusion.  Patent canal and foramina.

No acute perispinal finding. 30 mm infrarenal abdominal aortic
aneurysm. Bilateral renal sinus cysts.
IMPRESSION: 1. Transitional lumbosacral anatomy with rudimentary S1-2 disc space
as described on myelography 05/27/2015.
2. Chronic moderate canal stenosis at T11-12 with flattening of the
right cord due to posterior element hypertrophy. Compared to lumbar
CT myelogram in 6187, no new or progressive stenosis to explain
weakness.
3. Severe lower thoracic facet arthropathy, asymmetric to the left.
4. Severe left foraminal stenosis from T9-10 to T12-L1. Moderate to
advanced right foraminal stenosis at T11-12 and T12-L1.
5. L2 -S2 solid fusion with patent canal and foramina.
6. S shaped thoracolumbar scoliosis.
7. 30 mm infrarenal abdominal aortic aneurysm is stable from 6187.
Recommend followup by ultrasound in 3 years. This recommendation
follows ACR consensus guidelines: [place] [employer] ACR [employer]idental

## 2019-01-20 ENCOUNTER — Encounter: Payer: Self-pay | Admitting: Gastroenterology
